# Patient Record
Sex: Female | Born: 1990 | Race: White | Hispanic: No | Marital: Single | Smoking: Current every day smoker
Health system: Southern US, Community
[De-identification: ages and names within clinical notes are randomized; demographics above are authoritative.]

## PROBLEM LIST (undated history)

## (undated) DIAGNOSIS — J45909 Unspecified asthma, uncomplicated: Secondary | ICD-10-CM

## (undated) DIAGNOSIS — F1920 Other psychoactive substance dependence, uncomplicated: Secondary | ICD-10-CM

## (undated) DIAGNOSIS — K649 Unspecified hemorrhoids: Secondary | ICD-10-CM

## (undated) DIAGNOSIS — F1020 Alcohol dependence, uncomplicated: Secondary | ICD-10-CM

## (undated) DIAGNOSIS — K7030 Alcoholic cirrhosis of liver without ascites: Secondary | ICD-10-CM

## (undated) DIAGNOSIS — R000 Tachycardia, unspecified: Secondary | ICD-10-CM

## (undated) DIAGNOSIS — G43019 Migraine without aura, intractable, without status migrainosus: Principal | ICD-10-CM

## (undated) DIAGNOSIS — K226 Gastro-esophageal laceration-hemorrhage syndrome: Secondary | ICD-10-CM

## (undated) DIAGNOSIS — B1920 Unspecified viral hepatitis C without hepatic coma: Secondary | ICD-10-CM

## (undated) DIAGNOSIS — E876 Hypokalemia: Secondary | ICD-10-CM

## (undated) DIAGNOSIS — A0472 Enterocolitis due to Clostridium difficile, not specified as recurrent: Secondary | ICD-10-CM

## (undated) HISTORY — DX: Unspecified viral hepatitis C without hepatic coma: B19.20

## (undated) HISTORY — DX: Gastro-esophageal laceration-hemorrhage syndrome: K22.6

## (undated) HISTORY — DX: Enterocolitis due to Clostridium difficile, not specified as recurrent: A04.72

## (undated) HISTORY — DX: Migraine without aura, intractable, without status migrainosus: G43.019

## (undated) HISTORY — DX: Hypokalemia: E87.6

## (undated) HISTORY — PX: ESOPHAGUS SURGERY: SHX626

## (undated) HISTORY — DX: Unspecified hemorrhoids: K64.9

## (undated) HISTORY — DX: Alcoholic cirrhosis of liver without ascites: K70.30

## (undated) HISTORY — DX: Other disorders of phosphorus metabolism: E83.39

## (undated) HISTORY — PX: OTHER SURGICAL HISTORY: SHX169

---

## 2008-12-06 ENCOUNTER — Emergency Department: Admit: 2008-12-06 | Payer: Self-pay | Source: Emergency Department | Admitting: Emergency Medical Services

## 2010-09-26 ENCOUNTER — Ambulatory Visit: Payer: Self-pay

## 2010-09-30 LAB — LAB USE ONLY - HISTORICAL SURGICAL PATHOLOGY

## 2012-02-12 ENCOUNTER — Encounter (HOSPITAL_COMMUNITY): Payer: Self-pay | Admitting: *Deleted

## 2012-02-12 ENCOUNTER — Emergency Department (HOSPITAL_COMMUNITY)
Admission: EM | Admit: 2012-02-12 | Discharge: 2012-02-12 | Disposition: A | Discharge status: Home / Self Care | Payer: Self-pay | Attending: Emergency Medicine | Admitting: Emergency Medicine

## 2012-02-12 DIAGNOSIS — G40909 Epilepsy, unspecified, not intractable, without status epilepticus: Secondary | ICD-10-CM | POA: Insufficient documentation

## 2012-02-12 DIAGNOSIS — Z880 Allergy status to penicillin: Secondary | ICD-10-CM | POA: Insufficient documentation

## 2012-02-12 DIAGNOSIS — Y929 Unspecified place or not applicable: Secondary | ICD-10-CM | POA: Insufficient documentation

## 2012-02-12 DIAGNOSIS — Z882 Allergy status to sulfonamides status: Secondary | ICD-10-CM | POA: Insufficient documentation

## 2012-02-12 DIAGNOSIS — Z23 Encounter for immunization: Secondary | ICD-10-CM | POA: Insufficient documentation

## 2012-02-12 DIAGNOSIS — Z79899 Other long term (current) drug therapy: Secondary | ICD-10-CM | POA: Insufficient documentation

## 2012-02-12 DIAGNOSIS — S0180XA Unspecified open wound of other part of head, initial encounter: Secondary | ICD-10-CM | POA: Insufficient documentation

## 2012-02-12 DIAGNOSIS — S01119A Laceration without foreign body of unspecified eyelid and periocular area, initial encounter: Secondary | ICD-10-CM

## 2012-02-12 DIAGNOSIS — W101XXA Fall (on)(from) sidewalk curb, initial encounter: Secondary | ICD-10-CM | POA: Insufficient documentation

## 2012-02-12 MED ORDER — ACETAMINOPHEN 325 MG PO TABS
ORAL_TABLET | ORAL | Status: AC
  Filled 2012-02-12: qty 2

## 2012-02-12 MED ORDER — ACETAMINOPHEN 80 MG/0.8ML PO SUSP: 15.0000 mg/kg | Freq: Once | ORAL | Status: DC

## 2012-02-12 MED ORDER — ACETAMINOPHEN 325 MG PO TABS
650.0000 mg | ORAL_TABLET | Freq: Once | ORAL | Status: AC
  Administered 2012-02-12: 650 mg via ORAL

## 2012-02-12 MED ORDER — TETANUS-DIPHTH-ACELL PERTUSSIS 5-2.5-18.5 LF-MCG/0.5 IM SUSP
0.5000 mL | Freq: Once | INTRAMUSCULAR | Status: AC
  Administered 2012-02-12: 0.5 mL via INTRAMUSCULAR
  Filled 2012-02-12: qty 0.5

## 2012-02-12 NOTE — ED Provider Notes (Signed)
History     CSN: [id number]  Arrival date & time 02/12/12  1842   First MD Initiated Contact with Patient 02/12/12 1848      Chief Complaint  Patient presents with  . Fall  . Seizures    (Consider location/radiation/quality/duration/timing/severity/associated sxs/prior treatment) Patient is a 21 y.o. female presenting with fall. The history is provided by the patient.  Fall Associated symptoms include headaches. Pertinent negatives include no numbness, no nausea and no vomiting.   the patient is a 21 year old, female, who presents emergency she had a falls.  She tripped on a curb.  Her grandmother stated that she had some shaking activity, which she thought was a seizure.  The patient has never had a seizure in the past.  Presently, the patient is on a backboard with a collar in place.  She complains of a headache.  She denies nausea, vomiting vision changes.  She denies neck pain, weakness, or paresthesias.  She denies chest pain, back pain, or abdominal pain.  She denies pain in any of her extremities.  She is not taking any anticoagulants.  Her last tetanus shot was several years ago.  History reviewed. No pertinent past medical history.  History reviewed. No pertinent past surgical history.  History reviewed. No pertinent family history.  History  Substance Use Topics  . Smoking status: Not on file  . Smokeless tobacco: Not on file  . Alcohol Use: Not on file    OB History    Grav Para Term Preterm Abortions TAB SAB Ect Mult Living                  Review of Systems  HENT: Negative for neck pain.   Eyes: Negative for visual disturbance.  Cardiovascular: Negative for chest pain.  Gastrointestinal: Negative for nausea and vomiting.  Musculoskeletal: Negative for back pain.  Skin: Positive for wound.       Laceration in left eyebrow  Neurological: Positive for seizures and headaches. Negative for weakness and numbness.  Hematological: Does not bruise/bleed easily.    Psychiatric/Behavioral: Negative for confusion.  All other systems reviewed and are negative.    Allergies  Penicillins and Sulfa antibiotics  Home Medications   Current Outpatient Rx  Name Route Sig Dispense Refill  . ALBUTEROL SULFATE HFA 108 (90 BASE) MCG/ACT IN AERS Inhalation Inhale 2 puffs into the lungs every 6 (six) hours as needed.      BP 122/85  Pulse 98  Temp 98.5 F (36.9 C)  Resp 18  SpO2 100%  Physical Exam  Nursing note and vitals reviewed. Constitutional: She is oriented to person, place, and time. She appears well-developed and well-nourished. No distress.       Immobilized on backboard with collar in place  HENT:  Head: Normocephalic.       2 cm clean laceration in left eyebrow, without active bleeding  Eyes: Conjunctivae and EOM are normal. Pupils are equal, round, and reactive to light.  Neck: Normal range of motion. Neck supple.       No spinal tenderness.  Collar removed.  Patient.  Flexed her neck without pain, or paresthesias or weakness  Cardiovascular: Normal rate.   No murmur heard. Pulmonary/Chest: Effort normal. No respiratory distress. She exhibits no tenderness.  Abdominal: Soft. She exhibits no distension. There is no tenderness.  Musculoskeletal: Normal range of motion.       No spinal tenderness.  No extremity deformities, swelling, or pain with range of motion  Neurological: She  is alert and oriented to person, place, and time. No cranial nerve deficit.  Skin: Skin is warm and dry.  Psychiatric: She has a normal mood and affect. Thought content normal.    ED Course  Procedures (including critical care time) Left eyebrow laceration after fall.  No other injuries.  No signs of intracranial injury or significant injuries.  Laceration repair with Dermabond and Steri-Strips   Labs Reviewed - No data to display No results found.   No diagnosis found.    MDM  Left eyebrow laceration after fall.  Tetanus updated.  No significant  injuries        Cheri Guppy, MD 02/12/12 2050

## 2012-02-12 NOTE — Discharge Instructions (Signed)
Keep your wound clean and dry.  You Steri-Strips for the next 5 days to support.  The Dermabond glue.  Followup with your Dr. for signs of infection.  Use Tylenol for pain

## 2012-02-12 NOTE — ED Notes (Signed)
Family at bedside.  No distress.  Pt sitting up talking without distress.

## 2012-02-12 NOTE — ED Notes (Signed)
Per EMS:  Pt from home.  Reports that pt tripped while walking on a crub.  Witnesses report that patient had a seizure after the fall.  EMS reports confusion, no combative, aggitation.  Pt reports that the year is 2012 and that is it June.  pts grandmother at bedside.  Pt is a recovering drug addict.  Vitals stable.  No distress noted.

## 2012-02-12 NOTE — ED Notes (Signed)
See triage note by same RN.  Assessment done with EMS at bedside.

## 2012-11-16 DIAGNOSIS — B1920 Unspecified viral hepatitis C without hepatic coma: Secondary | ICD-10-CM

## 2012-11-16 HISTORY — DX: Unspecified viral hepatitis C without hepatic coma: B19.20

## 2013-11-16 DIAGNOSIS — Z8619 Personal history of other infectious and parasitic diseases: Secondary | ICD-10-CM | POA: Insufficient documentation

## 2014-02-25 ENCOUNTER — Encounter (HOSPITAL_COMMUNITY): Payer: Self-pay | Admitting: Emergency Medicine

## 2014-02-25 DIAGNOSIS — J45909 Unspecified asthma, uncomplicated: Secondary | ICD-10-CM | POA: Diagnosis not present

## 2014-02-25 DIAGNOSIS — Z79899 Other long term (current) drug therapy: Secondary | ICD-10-CM | POA: Diagnosis not present

## 2014-02-25 DIAGNOSIS — E860 Dehydration: Secondary | ICD-10-CM | POA: Diagnosis not present

## 2014-02-25 DIAGNOSIS — R112 Nausea with vomiting, unspecified: Secondary | ICD-10-CM | POA: Diagnosis present

## 2014-02-25 DIAGNOSIS — F17200 Nicotine dependence, unspecified, uncomplicated: Secondary | ICD-10-CM | POA: Insufficient documentation

## 2014-02-25 DIAGNOSIS — Z880 Allergy status to penicillin: Secondary | ICD-10-CM | POA: Insufficient documentation

## 2014-02-25 DIAGNOSIS — Z3202 Encounter for pregnancy test, result negative: Secondary | ICD-10-CM | POA: Insufficient documentation

## 2014-02-25 LAB — URINE MICROSCOPIC-ADD ON

## 2014-02-25 LAB — URINALYSIS, ROUTINE W REFLEX MICROSCOPIC
Bilirubin Urine: NEGATIVE
GLUCOSE, UA: NEGATIVE mg/dL
Hgb urine dipstick: NEGATIVE
KETONES UR: NEGATIVE mg/dL
LEUKOCYTES UA: NEGATIVE
NITRITE: NEGATIVE
PH: 5 (ref 5.0–8.0)
PROTEIN: NEGATIVE mg/dL
Specific Gravity, Urine: 1.021 (ref 1.005–1.030)
Urobilinogen, UA: 0.2 mg/dL (ref 0.0–1.0)

## 2014-02-25 LAB — POC URINE PREG, ED: PREG TEST UR: NEGATIVE

## 2014-02-25 MED ORDER — ONDANSETRON 4 MG PO TBDP
8.0000 mg | ORAL_TABLET | Freq: Once | ORAL | Status: AC
Start: 2014-02-25 — End: 2014-02-25
  Administered 2014-02-25: 8 mg via ORAL
  Filled 2014-02-25: qty 2

## 2014-02-25 NOTE — ED Notes (Signed)
To ED for eval of nausea and vomiting that started last night. Pt states she last ate [employer] cheeseburger last pm. C/o abd cramping. No back pain. LMP last week and normal. Denies fevers

## 2014-02-26 ENCOUNTER — Emergency Department (HOSPITAL_COMMUNITY)
Admission: EM | Admit: 2014-02-26 | Discharge: 2014-02-26 | Disposition: A | Discharge status: Home / Self Care | Payer: Medicaid Other | Attending: Emergency Medicine | Admitting: Emergency Medicine

## 2014-02-26 DIAGNOSIS — R1110 Vomiting, unspecified: Secondary | ICD-10-CM

## 2014-02-26 DIAGNOSIS — E860 Dehydration: Secondary | ICD-10-CM

## 2014-02-26 HISTORY — DX: Unspecified asthma, uncomplicated: J45.909

## 2014-02-26 LAB — BASIC METABOLIC PANEL
Anion gap: 19 — ABNORMAL HIGH (ref 5–15)
BUN: 7 mg/dL (ref 6–23)
CALCIUM: 8.6 mg/dL (ref 8.4–10.5)
CHLORIDE: 96 meq/L (ref 96–112)
CO2: 26 meq/L (ref 19–32)
CREATININE: 0.68 mg/dL (ref 0.50–1.10)
GFR calc Af Amer: >90 mL/min (ref >90)
GFR calc non Af Amer: >90 mL/min (ref >90)
Glucose, Bld: 92 mg/dL (ref 70–99)
Potassium: 3.4 mEq/L — ABNORMAL LOW (ref 3.7–5.3)
Sodium: 141 mEq/L (ref 137–147)

## 2014-02-26 LAB — CBC
HEMATOCRIT: 34.3 % — AB (ref 36.0–46.0)
HEMOGLOBIN: 12 g/dL (ref 12.0–15.0)
MCH: 30.2 pg (ref 26.0–34.0)
MCHC: 35 g/dL (ref 30.0–36.0)
MCV: 86.2 fL (ref 78.0–100.0)
Platelets: 205 10*3/uL (ref 150–400)
RBC: 3.98 MIL/uL (ref 3.87–5.11)
RDW: 14.4 % (ref 11.5–15.5)
WBC: 8.7 10*3/uL (ref 4.0–10.5)

## 2014-02-26 MED ORDER — PROMETHAZINE HCL 25 MG PO TABS: 25.0000 mg | ORAL_TABLET | Freq: Four times a day (QID) | ORAL | Status: DC | PRN

## 2014-02-26 MED ORDER — FAMOTIDINE IN NACL 20-0.9 MG/50ML-% IV SOLN
20.0000 mg | Freq: Once | INTRAVENOUS | Status: AC
Start: 2014-02-26 — End: 2014-02-26
  Administered 2014-02-26: 20 mg via INTRAVENOUS
  Filled 2014-02-26: qty 50

## 2014-02-26 MED ORDER — SODIUM CHLORIDE 0.9 % IV BOLUS (SEPSIS)
1000.0000 mL | Freq: Once | INTRAVENOUS | Status: AC
  Administered 2014-02-26: 1000 mL via INTRAVENOUS

## 2014-02-26 NOTE — ED Notes (Signed)
Has been vomiting since waking yesterday morning.  Unable to keep even sips of water down. Recently dx with hepatitis, has not started treatment.  Reports approx 7 similar episodes in past few months.

## 2014-02-26 NOTE — ED Notes (Signed)
Patient reports she is feeling some better.  No n/v.  She is complaining only of being tired and ready to go home.  Iv bolus infusing.  Will d/c at completion

## 2014-02-26 NOTE — Discharge Instructions (Signed)
Dehydration, Adult Dehydration is when you lose more fluids from the body than you take in. Vital organs like the kidneys, brain, and heart cannot function without a proper amount of fluids and salt. Any loss of fluids from the body can cause dehydration.  CAUSES   Vomiting.  Diarrhea.  Excessive sweating.  Excessive urine output.  Fever. SYMPTOMS  Mild dehydration  Thirst.  Dry lips.  Slightly dry mouth. Moderate dehydration  Very dry mouth.  Sunken eyes.  Skin does not bounce back quickly when lightly pinched and released.  Dark urine and decreased urine production.  Decreased tear production.  Headache. Severe dehydration  Very dry mouth.  Extreme thirst.  Rapid, weak pulse (more than 100 beats per minute at rest).  Cold hands and feet.  Not able to sweat in spite of heat and temperature.  Rapid breathing.  Blue lips.  Confusion and lethargy.  Difficulty being awakened.  Minimal urine production.  No tears. DIAGNOSIS  Your caregiver will diagnose dehydration based on your symptoms and your exam. Blood and urine tests will help confirm the diagnosis. The diagnostic evaluation should also identify the cause of dehydration. TREATMENT  Treatment of mild or moderate dehydration can often be done at home by increasing the amount of fluids that you drink. It is best to drink small amounts of fluid more often. Drinking too much at one time can make vomiting worse. Refer to the home care instructions below. Severe dehydration needs to be treated at the hospital where you will probably be given intravenous (IV) fluids that contain water and electrolytes. HOME CARE INSTRUCTIONS   Ask your caregiver about specific rehydration instructions.  Drink enough fluids to keep your urine clear or pale yellow.  Drink small amounts frequently if you have nausea and vomiting.  Eat as you normally do.  Avoid:  Foods or drinks high in sugar.  Carbonated  drinks.  Juice.  Extremely hot or cold fluids.  Drinks with caffeine.  Fatty, greasy foods.  Alcohol.  Tobacco.  Overeating.  Gelatin desserts.  Wash your hands well to avoid spreading bacteria and viruses.  Only take over-the-counter or prescription medicines for pain, discomfort, or fever as directed by your caregiver.  Ask your caregiver if you should continue all prescribed and over-the-counter medicines.  Keep all follow-up appointments with your caregiver. SEEK MEDICAL CARE IF:  You have abdominal pain and it increases or stays in one area (localizes).  You have a rash, stiff neck, or severe headache.  You are irritable, sleepy, or difficult to awaken.  You are weak, dizzy, or extremely thirsty. SEEK IMMEDIATE MEDICAL CARE IF:   You are unable to keep fluids down or you get worse despite treatment.  You have frequent episodes of vomiting or diarrhea.  You have blood or green matter (bile) in your vomit.  You have blood in your stool or your stool looks black and tarry.  You have not urinated in 6 to 8 hours, or you have only urinated a small amount of very dark urine.  You have a fever.  You faint. MAKE SURE YOU:   Understand these instructions.  Will watch your condition.  Will get help right away if you are not doing well or get worse. Document Released: 08/04/2005 Document Revised: 10/27/2011 Document Reviewed: 03/24/2011 ExitCare Patient Information 2015 [employer]. This information is not intended to replace advice given to you by your health care provider. Make sure you discuss any questions you have with your health care   provider.  

## 2014-02-26 NOTE — ED Notes (Signed)
Attempted IV x 1, unsuccessful.  Will call IV team to start line. Pt aware.

## 2014-02-26 NOTE — ED Notes (Signed)
Attempted IV x3, unsuccessful.

## 2014-02-26 NOTE — ED Provider Notes (Signed)
CSN: [id number]     Arrival date & time 02/25/14  2047 History   First MD Initiated Contact with Patient 02/26/14 0034     Chief Complaint  Patient presents with  . Emesis     (Consider location/radiation/quality/duration/timing/severity/associated sxs/prior Treatment) HPI This patient is a 23 year old woman who presents after experiencing several hours of intractable nausea and vomiting. Her emesis was nonbilious and nonbloody. At the time of intractable vomiting, the patient had diffuse abdominal pain. However, she is a pain free at this time. She says she feels dehydrated.  She has not experienced fever, GU symptoms or diarrhea. She says she took Zofran at home but was unable to keep down. She notes a history of similar symptoms in the pas.  Past Medical History  Diagnosis Date  . Asthma    History reviewed. No pertinent past surgical history. History reviewed. No pertinent family history. History  Substance Use Topics  . Smoking status: Current Every Day Smoker -- 0.50 packs/day    Types: Cigarettes  . Smokeless tobacco: Not on file  . Alcohol Use: No   OB History   Grav Para Term Preterm Abortions TAB SAB Ect Mult Living                 Review of Systems  Ten point review of symptoms performed and is negative with the exception of symptoms noted above.   Allergies  Penicillins and Sulfa antibiotics  Home Medications   Prior to Admission medications   Medication Sig Start Date End Date Taking? Authorizing Provider  albuterol (PROVENTIL HFA;VENTOLIN HFA) 108 (90 BASE) MCG/ACT inhaler Inhale 2 puffs into the lungs every 6 (six) hours as needed. For shortness of breath   Yes Historical Provider, MD  Cyanocobalamin (B-12 PO) Take 1 tablet by mouth daily.   Yes Historical Provider, MD  folic acid (FOLVITE) 1 MG tablet Take 1 mg by mouth 2 (two) times daily.   Yes Historical Provider, MD  furosemide (LASIX) 20 MG tablet Take 20 mg by mouth daily as needed for fluid.   Yes  Historical Provider, MD  gabapentin (NEURONTIN) 300 MG capsule Take 300 mg by mouth 2 (two) times daily.   Yes Historical Provider, MD  Isopropyl Alcohol (SWIMMERS EAR DROPS OT) Place 3-4 drops into both ears as needed (for swimmers ear).   Yes Historical Provider, MD  Multiple Vitamins-Minerals (MULTIVITAMIN PO) Take 1 tablet by mouth daily.   Yes Historical Provider, MD  norethindrone-ethinyl estradiol (MICROGESTIN,JUNEL,LOESTRIN) 1-20 MG-MCG tablet Take 1 tablet by mouth daily.   Yes Historical Provider, MD  Pyridoxine HCl (B-6 PO) Take 1 tablet by mouth daily.   Yes Historical Provider, MD  Thiamine HCl (B-1 PO) Take 1 tablet by mouth daily.   Yes Historical Provider, MD   BP 135/91  Pulse 90  Temp(Src) 98.8 F (37.1 C) (Oral)  Resp 18  Ht 5\' 5"  (1.651 m)  Wt 110 lb (49.896 kg)  BMI 18.31 kg/m2  SpO2 100%  LMP 02/18/2014 Physical Exam  Gen: well developed and well nourished appearing Head: NCAT Eyes: PERL, EOMI Nose: no epistaixis or rhinorrhea Mouth/throat: mucosa is mildly dehydrated appearing and pink Neck: supple, no stridor Lungs: CTA B, no wheezing, rhonchi or rales CV: RRR, no murmur, extremities appear well perfused.  Abd: soft, notender, nondistended Back: no ttp, no cva ttp Skin: warm and dry Ext: normal to inspection, no dependent edema Neuro: CN ii-xii grossly intact, no focal deficits Psyche; normal affect,  calm and cooperative.  ED Course  Procedures (including critical care time) Labs Review  Results for orders placed during the hospital encounter of 02/26/14 (from the past 24 hour(s))  POC URINE PREG, ED     Status: None   Collection Time    02/25/14  9:19 PM      Result Value Ref Range   Preg Test, Ur NEGATIVE  NEGATIVE  URINALYSIS, ROUTINE W REFLEX MICROSCOPIC     Status: Abnormal   Collection Time    02/25/14  9:19 PM      Result Value Ref Range   Color, Urine YELLOW  YELLOW   APPearance TURBID (*) CLEAR   Specific Gravity, Urine 1.021   1.005 - 1.030   pH 5.0  5.0 - 8.0   Glucose, UA NEGATIVE  NEGATIVE mg/dL   Hgb urine dipstick NEGATIVE  NEGATIVE   Bilirubin Urine NEGATIVE  NEGATIVE   Ketones, ur NEGATIVE  NEGATIVE mg/dL   Protein, ur NEGATIVE  NEGATIVE mg/dL   Urobilinogen, UA 0.2  0.0 - 1.0 mg/dL   Nitrite NEGATIVE  NEGATIVE   Leukocytes, UA NEGATIVE  NEGATIVE  URINE MICROSCOPIC-ADD ON     Status: Abnormal   Collection Time    02/25/14  9:19 PM      Result Value Ref Range   Squamous Epithelial / LPF FEW (*) RARE   Bacteria, UA FEW (*) RARE   Crystals URIC ACID CRYSTALS (*) NEGATIVE   Urine-Other AMORPHOUS URATES/PHOSPHATES    CBC     Status: Abnormal   Collection Time    02/26/14 12:55 AM      Result Value Ref Range   WBC 8.7  4.0 - 10.5 K/uL   RBC 3.98  3.87 - 5.11 MIL/uL   Hemoglobin 12.0  12.0 - 15.0 g/dL   HCT 16.1 (*) 09.6 - 04.5 %   MCV 86.2  78.0 - 100.0 fL   MCH 30.2  26.0 - 34.0 pg   MCHC 35.0  30.0 - 36.0 g/dL   RDW 40.9  81.1 - 91.4 %   Platelets 205  150 - 400 K/uL     MDM    DDX: gastritis, PUD, GERD, pancreatitis, gallbladder disease, SBO, colitis, UTI, enteritis.   Patient's VS and abdominal exam are benign. She is able to tolerate sips of clears. We will volume resuscitate with NS. Results of BMP pending. Anticipate d/c home.    Brandt Loosen, MD 02/26/14 [phone]

## 2014-06-21 ENCOUNTER — Encounter: Payer: Self-pay | Admitting: Family Medicine

## 2014-06-21 ENCOUNTER — Ambulatory Visit: Payer: Medicaid Other | Attending: Family Medicine | Admitting: Family Medicine

## 2014-06-21 ENCOUNTER — Ambulatory Visit (HOSPITAL_BASED_OUTPATIENT_CLINIC_OR_DEPARTMENT_OTHER): Payer: Medicaid Other | Admitting: *Deleted

## 2014-06-21 ENCOUNTER — Other Ambulatory Visit: Payer: Self-pay | Admitting: Family Medicine

## 2014-06-21 VITALS — BP 105/73 | HR 100 | Temp 98.7°F | Resp 18 | Ht 65.0 in | Wt 104.0 lb

## 2014-06-21 DIAGNOSIS — F418 Other specified anxiety disorders: Secondary | ICD-10-CM | POA: Diagnosis not present

## 2014-06-21 DIAGNOSIS — Z7689 Persons encountering health services in other specified circumstances: Secondary | ICD-10-CM | POA: Diagnosis present

## 2014-06-21 DIAGNOSIS — M25562 Pain in left knee: Secondary | ICD-10-CM | POA: Diagnosis not present

## 2014-06-21 DIAGNOSIS — B1920 Unspecified viral hepatitis C without hepatic coma: Secondary | ICD-10-CM | POA: Insufficient documentation

## 2014-06-21 DIAGNOSIS — Z23 Encounter for immunization: Secondary | ICD-10-CM | POA: Diagnosis not present

## 2014-06-21 DIAGNOSIS — M25561 Pain in right knee: Secondary | ICD-10-CM | POA: Diagnosis not present

## 2014-06-21 DIAGNOSIS — F17200 Nicotine dependence, unspecified, uncomplicated: Secondary | ICD-10-CM | POA: Diagnosis not present

## 2014-06-21 DIAGNOSIS — R630 Anorexia: Secondary | ICD-10-CM | POA: Diagnosis not present

## 2014-06-21 DIAGNOSIS — G479 Sleep disorder, unspecified: Secondary | ICD-10-CM | POA: Diagnosis not present

## 2014-06-21 DIAGNOSIS — J45909 Unspecified asthma, uncomplicated: Secondary | ICD-10-CM | POA: Insufficient documentation

## 2014-06-21 DIAGNOSIS — R110 Nausea: Secondary | ICD-10-CM | POA: Diagnosis not present

## 2014-06-21 DIAGNOSIS — M25519 Pain in unspecified shoulder: Secondary | ICD-10-CM | POA: Insufficient documentation

## 2014-06-21 DIAGNOSIS — D649 Anemia, unspecified: Secondary | ICD-10-CM

## 2014-06-21 DIAGNOSIS — B182 Chronic viral hepatitis C: Secondary | ICD-10-CM | POA: Insufficient documentation

## 2014-06-21 DIAGNOSIS — F1099 Alcohol use, unspecified with unspecified alcohol-induced disorder: Secondary | ICD-10-CM | POA: Diagnosis not present

## 2014-06-21 DIAGNOSIS — F329 Major depressive disorder, single episode, unspecified: Secondary | ICD-10-CM

## 2014-06-21 DIAGNOSIS — E876 Hypokalemia: Secondary | ICD-10-CM

## 2014-06-21 DIAGNOSIS — F32A Depression, unspecified: Secondary | ICD-10-CM

## 2014-06-21 DIAGNOSIS — R634 Abnormal weight loss: Secondary | ICD-10-CM | POA: Insufficient documentation

## 2014-06-21 LAB — POCT URINALYSIS DIPSTICK
Bilirubin, UA: NEGATIVE
Blood, UA: NEGATIVE
Glucose, UA: NEGATIVE
KETONES UA: NEGATIVE
Nitrite, UA: NEGATIVE
PH UA: 6.5
PROTEIN UA: NEGATIVE
Spec Grav, UA: <=1.005
UROBILINOGEN UA: 0.2

## 2014-06-21 LAB — CBC
HCT: 32.9 % — ABNORMAL LOW (ref 36.0–46.0)
Hemoglobin: 10.9 g/dL — ABNORMAL LOW (ref 12.0–15.0)
MCH: 31.8 pg (ref 26.0–34.0)
MCHC: 33.1 g/dL (ref 30.0–36.0)
MCV: 95.9 fL (ref 78.0–100.0)
PLATELETS: 299 10*3/uL (ref 150–400)
RBC: 3.43 MIL/uL — ABNORMAL LOW (ref 3.87–5.11)
RDW: 16 % — AB (ref 11.5–15.5)
WBC: 6 10*3/uL (ref 4.0–10.5)

## 2014-06-21 MED ORDER — PROMETHAZINE HCL 25 MG PO TABS: 25.0000 mg | ORAL_TABLET | Freq: Four times a day (QID) | ORAL | Status: DC | PRN

## 2014-06-21 MED ORDER — ALBUTEROL SULFATE HFA 108 (90 BASE) MCG/ACT IN AERS: 2.0000 | INHALATION_SPRAY | Freq: Four times a day (QID) | RESPIRATORY_TRACT | Status: DC | PRN

## 2014-06-21 MED ORDER — FUROSEMIDE 20 MG PO TABS: 20.0000 mg | ORAL_TABLET | Freq: Every day | ORAL | Status: DC | PRN

## 2014-06-21 MED ORDER — TRAMADOL HCL 50 MG PO TABS: 50.0000 mg | ORAL_TABLET | Freq: Two times a day (BID) | ORAL | Status: DC | PRN

## 2014-06-21 MED ORDER — MIRTAZAPINE 15 MG PO TABS: 15.0000 mg | ORAL_TABLET | Freq: Every day | ORAL | Status: DC

## 2014-06-21 NOTE — Progress Notes (Signed)
   Subjective:    Patient ID: Mary Garrison, female    DOB: 1991/06/26, 23 y.o.   MRN: [id number] CC: establish care, Hep C HPI 23 yo F presents to establish care  1. Hep C: dx 11/2012 while living in [place]. Presented with aches, weight loss, edema. Has not had antiviral treatment. Reports being HIV negative. Drinks 2-3 alcoholic drinks nightly. No tylenol or NSAIDs. Not sexually active. Reports 20-25# weight loss over the past year. Admits to nausea every morning. occasional non-bloody emesis, no diarrhea, no abdominal pain, no bleeding, no bruising. Is achy is shoulders and knees daily.   2. Anxiety and depression: reports hx of both. Previously treated with benzo and effexor. Patient did not like the way they made her feels. Admits to depressed mood and poor sleep. Denies SI and HI.   Med Hx: hep C dx 11/2012 Fam Hx: depression in brother  Soc Hx: smoker 1/4 PPD,  ETOH 2-3 drinks nightly  Review of Systems As per HPI  GAD-7:score of 7, 0-5, 1-1-4,7. 2-6.     Objective:   Physical Exam BP 105/73 mmHg  Pulse 100  Temp(Src) 98.7 F (37.1 C) (Oral)  Resp 18  Ht 5\' 5"  (1.651 m)  Wt 104 lb (47.174 kg)  BMI 17.31 kg/m2  SpO2 98%  LMP 02/24/2014 General appearance: alert, cooperative and no distress Eyes: negative findings: lids and lashes normal and pupils equal, round, reactive to light and accomodation, positive findings: conjunctiva: clear and sclera with icterus peripherally  Lungs: clear to auscultation bilaterally Heart: regular rate and rhythm, S1, S2 normal, no murmur, click, rub or gallop Abdomen: soft, non-tender; bowel sounds normal; no masses,  no organomegaly Extremities: extremities normal, atraumatic, no cyanosis or edema Skin: Skin color, texture, turgor normal. No rashes or lesions       Assessment & Plan:

## 2014-06-21 NOTE — Patient Instructions (Signed)
[name],  Thank you for coming in today. It was a pleasure meeting you. I look forward to being your primary doctor.  1. For appetite: please start remeron 15 mg nightly.  2. For nausea: phenergan as needed  3. For pain in knees and shoulder: tramadol refilled.   Please stop drinking alcohol completely by slowly cutting back.   I have placed a referral to the Hep C clinic.  You will be called with lab results.   F/u with me in 4 weeks for weight check.   Dr. Armen PickupFunches

## 2014-06-21 NOTE — Progress Notes (Signed)
Establish Care Pt stated has Hep C Moved for [place] in July

## 2014-06-21 NOTE — Assessment & Plan Note (Addendum)
  1. For appetite: please start remeron 15 mg nightly.  2. For nausea: phenergan as needed  3. For pain in knees and shoulder: tramadol refilled.   Please stop drinking alcohol completely by slowly cutting back.   Patient to sign release of medical information form to obtain and review records   I have placed a referral to the Hep C clinic.  You will be called with lab results: CBC, CP, HIV, Hep C Ab with reflex, Hep C RNA quant, INR   F/u with me in 4 weeks for weight check.

## 2014-06-21 NOTE — Assessment & Plan Note (Signed)
A: depression with poor sleep and anorexia P: remeron

## 2014-06-22 LAB — COMPLETE METABOLIC PANEL WITH GFR
ALBUMIN: 3.2 g/dL — AB (ref 3.5–5.2)
ALT: 31 U/L (ref 0–35)
AST: 145 U/L — ABNORMAL HIGH (ref 0–37)
Alkaline Phosphatase: 146 U/L — ABNORMAL HIGH (ref 39–117)
BUN: 3 mg/dL — AB (ref 6–23)
CALCIUM: 9 mg/dL (ref 8.4–10.5)
CHLORIDE: 95 meq/L — AB (ref 96–112)
CO2: 27 meq/L (ref 19–32)
CREATININE: 0.47 mg/dL — AB (ref 0.50–1.10)
GLUCOSE: 91 mg/dL (ref 70–99)
POTASSIUM: 2.9 meq/L — AB (ref 3.5–5.3)
Sodium: 140 mEq/L (ref 135–145)
Total Bilirubin: 1.4 mg/dL — ABNORMAL HIGH (ref 0.2–1.2)
Total Protein: 6.2 g/dL (ref 6.0–8.3)

## 2014-06-22 LAB — PROTIME-INR
INR: 1.11 (ref <1.50)
PROTHROMBIN TIME: 14.3 s (ref 11.6–15.2)

## 2014-06-22 LAB — HEPATITIS C ANTIBODY: HCV Ab: REACTIVE — AB

## 2014-06-22 LAB — HIV ANTIBODY (ROUTINE TESTING W REFLEX): HIV 1&2 Ab, 4th Generation: NONREACTIVE

## 2014-06-26 ENCOUNTER — Telehealth: Payer: Self-pay | Admitting: *Deleted

## 2014-06-26 DIAGNOSIS — E876 Hypokalemia: Secondary | ICD-10-CM | POA: Insufficient documentation

## 2014-06-26 DIAGNOSIS — D649 Anemia, unspecified: Secondary | ICD-10-CM | POA: Insufficient documentation

## 2014-06-26 HISTORY — DX: Hypokalemia: E87.6

## 2014-06-26 LAB — HEPATITIS C VRS RNA DETECT BY PCR-QUAL: Hepatitis C Vrs RNA by PCR-Qual: POSITIVE — AB

## 2014-06-26 LAB — HEPATITIS C RNA QUANTITATIVE
HCV Quantitative Log: <1.18 {Log}
HCV Quantitative: <15 [IU]/mL

## 2014-06-26 MED ORDER — POTASSIUM CHLORIDE CRYS ER 20 MEQ PO TBCR: 40.0000 meq | EXTENDED_RELEASE_TABLET | Freq: Two times a day (BID) | ORAL | Status: DC

## 2014-06-26 NOTE — Addendum Note (Signed)
Addended by: Dessa PhiFUNCHES, Dayna Geurts on: 06/26/2014 10:48 AM   Modules accepted: Orders

## 2014-06-26 NOTE — Telephone Encounter (Signed)
Pt aware of lab results, transfer call to front office for lab appointment  Advised to stop drinking alcohol.

## 2014-06-26 NOTE — Telephone Encounter (Signed)
-----   Message from Josalyn C Funches, MD sent at 06/26/2014 10:48 AM EST ----- Mary Garrison,  Please call patient today.  HIV and Hep C quantitative negative. Patient does have liver dysfunction, low potassium and anemia, Plan: Take potassium supplement sent in [facility] two tabs twice daily for 5 days.  Quit alcohol completely by slowly tapering down. Return within the next week for f/u labs repeat BMP, Mag,  Phos, Hep C and Hep B.  Schedule lab visit. 

## 2014-06-26 NOTE — Assessment & Plan Note (Signed)
A: hypokalemia noted on CMP P: Supplemental K+ sent to patient's pharmacy 40 MEq BID x 5 days  F/u labs: Mag, phos, BMP

## 2014-06-26 NOTE — Telephone Encounter (Signed)
-----   Message from Lora PaulaJosalyn C Funches, MD sent at 06/26/2014 10:48 AM EST ----- Mary Garrison,  Please call patient today.  HIV and Hep C quantitative negative. Patient does have liver dysfunction, low potassium and anemia, Plan: [facility] twice daily for 5 days.  Quit alcohol completely by slowly tapering down. Return within the next week for f/u labs repeat BMP, Mag,  Phos, Hep C and Hep B.  Schedule lab visit.

## 2014-06-27 ENCOUNTER — Ambulatory Visit: Payer: Medicaid Other | Attending: Family Medicine

## 2014-06-27 DIAGNOSIS — D649 Anemia, unspecified: Secondary | ICD-10-CM

## 2014-06-27 DIAGNOSIS — B1920 Unspecified viral hepatitis C without hepatic coma: Secondary | ICD-10-CM

## 2014-06-27 DIAGNOSIS — E876 Hypokalemia: Secondary | ICD-10-CM

## 2014-06-27 LAB — FERRITIN: Ferritin: 273 ng/mL (ref 10–291)

## 2014-06-28 LAB — IRON AND TIBC
%SAT: 67 % — ABNORMAL HIGH (ref 20–55)
Iron: 151 ug/dL — ABNORMAL HIGH (ref 42–145)
TIBC: 226 ug/dL — ABNORMAL LOW (ref 250–470)
UIBC: 75 ug/dL — ABNORMAL LOW (ref 125–400)

## 2014-06-28 LAB — BASIC METABOLIC PANEL
BUN: 4 mg/dL — ABNORMAL LOW (ref 6–23)
CALCIUM: 8.9 mg/dL (ref 8.4–10.5)
CO2: 27 mEq/L (ref 19–32)
Chloride: 99 mEq/L (ref 96–112)
Creat: 0.5 mg/dL (ref 0.50–1.10)
Glucose, Bld: 117 mg/dL — ABNORMAL HIGH (ref 70–99)
Potassium: 3.3 mEq/L — ABNORMAL LOW (ref 3.5–5.3)
Sodium: 141 mEq/L (ref 135–145)

## 2014-06-28 LAB — PHOSPHORUS: Phosphorus: 1.4 mg/dL — ABNORMAL LOW (ref 2.3–4.6)

## 2014-06-28 LAB — HEPATITIS C VRS RNA DETECT BY PCR-QUAL: HEPATITIS C VRS RNA BY PCR-QUAL: POSITIVE — AB

## 2014-06-28 LAB — HEPATITIS B SURFACE ANTIBODY, QUANTITATIVE: Hepatitis B-Post: 202 m[IU]/mL

## 2014-06-28 LAB — HEPATITIS C ANTIBODY, REFLEX: HCV Ab: REACTIVE — AB

## 2014-06-28 LAB — HEPATITIS B SURFACE ANTIGEN: Hepatitis B Surface Ag: NEGATIVE

## 2014-06-28 LAB — MAGNESIUM: Magnesium: 1.7 mg/dL (ref 1.5–2.5)

## 2014-06-28 MED ORDER — FOLIC ACID 1 MG PO TABS: 1.0000 mg | ORAL_TABLET | Freq: Every day | ORAL | Status: DC

## 2014-06-28 NOTE — Addendum Note (Signed)
Addended by: Dessa PhiFUNCHES, Phuong Hillary on: 06/28/2014 09:12 AM   Modules accepted: Orders

## 2014-06-29 ENCOUNTER — Telehealth: Payer: Self-pay | Admitting: Family Medicine

## 2014-06-29 HISTORY — DX: Other disorders of phosphorus metabolism: E83.39

## 2014-06-29 MED ORDER — POTASSIUM & SODIUM PHOSPHATES 280-160-250 MG PO PACK: 1.0000 | PACK | Freq: Three times a day (TID) | ORAL | Status: DC

## 2014-06-29 NOTE — Assessment & Plan Note (Signed)
A: low phos, normal calcium. Suspect alcohol abuse. Will get vit D level to r/o deficiency. Will replete with oral phosphorous.  P: Phos nak sent to pharmacy Recheck phos levels in 3-4 weeks

## 2014-06-29 NOTE — Telephone Encounter (Signed)
Called patient. Left VM Hep C confirmed Hep B immune  Normal Mag, improved potassium Low phos will need to take supplement since to pharmacy. Patient to start as soon as possible.

## 2014-07-03 ENCOUNTER — Other Ambulatory Visit: Payer: Medicaid Other

## 2014-07-11 ENCOUNTER — Encounter (HOSPITAL_COMMUNITY): Payer: Self-pay

## 2014-07-11 ENCOUNTER — Emergency Department (HOSPITAL_COMMUNITY)
Admission: EM | Admit: 2014-07-11 | Discharge: 2014-07-11 | Disposition: A | Discharge status: Home / Self Care | Payer: Medicaid Other | Attending: Emergency Medicine | Admitting: Emergency Medicine

## 2014-07-11 DIAGNOSIS — R509 Fever, unspecified: Secondary | ICD-10-CM | POA: Insufficient documentation

## 2014-07-11 DIAGNOSIS — R197 Diarrhea, unspecified: Secondary | ICD-10-CM | POA: Insufficient documentation

## 2014-07-11 DIAGNOSIS — J45909 Unspecified asthma, uncomplicated: Secondary | ICD-10-CM | POA: Insufficient documentation

## 2014-07-11 DIAGNOSIS — E46 Unspecified protein-calorie malnutrition: Secondary | ICD-10-CM | POA: Diagnosis not present

## 2014-07-11 DIAGNOSIS — Z3202 Encounter for pregnancy test, result negative: Secondary | ICD-10-CM | POA: Insufficient documentation

## 2014-07-11 DIAGNOSIS — R5383 Other fatigue: Secondary | ICD-10-CM | POA: Diagnosis not present

## 2014-07-11 DIAGNOSIS — Z79899 Other long term (current) drug therapy: Secondary | ICD-10-CM | POA: Diagnosis not present

## 2014-07-11 DIAGNOSIS — Z8619 Personal history of other infectious and parasitic diseases: Secondary | ICD-10-CM | POA: Insufficient documentation

## 2014-07-11 DIAGNOSIS — Z720 Tobacco use: Secondary | ICD-10-CM | POA: Diagnosis not present

## 2014-07-11 DIAGNOSIS — Z880 Allergy status to penicillin: Secondary | ICD-10-CM | POA: Insufficient documentation

## 2014-07-11 DIAGNOSIS — R112 Nausea with vomiting, unspecified: Secondary | ICD-10-CM

## 2014-07-11 LAB — URINALYSIS, ROUTINE W REFLEX MICROSCOPIC
BILIRUBIN URINE: NEGATIVE
Glucose, UA: NEGATIVE mg/dL
HGB URINE DIPSTICK: NEGATIVE
Ketones, ur: NEGATIVE mg/dL
Nitrite: NEGATIVE
Specific Gravity, Urine: 1.023 (ref 1.005–1.030)
UROBILINOGEN UA: 1 mg/dL (ref 0.0–1.0)
pH: 8.5 — ABNORMAL HIGH (ref 5.0–8.0)

## 2014-07-11 LAB — URINE MICROSCOPIC-ADD ON

## 2014-07-11 LAB — CBC WITH DIFFERENTIAL/PLATELET
BASOS PCT: 1 % (ref 0–1)
Basophils Absolute: 0 10*3/uL (ref 0.0–0.1)
Eosinophils Absolute: 0 10*3/uL (ref 0.0–0.7)
Eosinophils Relative: 0 % (ref 0–5)
HCT: 35.2 % — ABNORMAL LOW (ref 36.0–46.0)
Hemoglobin: 11.5 g/dL — ABNORMAL LOW (ref 12.0–15.0)
LYMPHS ABS: 1.6 10*3/uL (ref 0.7–4.0)
Lymphocytes Relative: 25 % (ref 12–46)
MCH: 31.4 pg (ref 26.0–34.0)
MCHC: 32.7 g/dL (ref 30.0–36.0)
MCV: 96.2 fL (ref 78.0–100.0)
Monocytes Absolute: 0.5 10*3/uL (ref 0.1–1.0)
Monocytes Relative: 9 % (ref 3–12)
NEUTROS ABS: 4.2 10*3/uL (ref 1.7–7.7)
NEUTROS PCT: 65 % (ref 43–77)
Platelets: 255 10*3/uL (ref 150–400)
RBC: 3.66 MIL/uL — AB (ref 3.87–5.11)
RDW: 14.5 % (ref 11.5–15.5)
WBC: 6.4 10*3/uL (ref 4.0–10.5)

## 2014-07-11 LAB — COMPREHENSIVE METABOLIC PANEL
ALK PHOS: 166 U/L — AB (ref 39–117)
ALT: 78 U/L — ABNORMAL HIGH (ref 0–35)
ANION GAP: 23 — AB (ref 5–15)
AST: 328 U/L — ABNORMAL HIGH (ref 0–37)
Albumin: 3.5 g/dL (ref 3.5–5.2)
BILIRUBIN TOTAL: 1.6 mg/dL — AB (ref 0.3–1.2)
BUN: 4 mg/dL — AB (ref 6–23)
CO2: 27 mEq/L (ref 19–32)
Calcium: 8.4 mg/dL (ref 8.4–10.5)
Chloride: 91 mEq/L — ABNORMAL LOW (ref 96–112)
Creatinine, Ser: 0.56 mg/dL (ref 0.50–1.10)
GFR calc Af Amer: >90 mL/min (ref >90)
GFR calc non Af Amer: >90 mL/min (ref >90)
GLUCOSE: 104 mg/dL — AB (ref 70–99)
POTASSIUM: 3.8 meq/L (ref 3.7–5.3)
Sodium: 141 mEq/L (ref 137–147)
TOTAL PROTEIN: 7.7 g/dL (ref 6.0–8.3)

## 2014-07-11 LAB — MAGNESIUM: Magnesium: 1.1 mg/dL — ABNORMAL LOW (ref 1.5–2.5)

## 2014-07-11 LAB — PREGNANCY, URINE: Preg Test, Ur: NEGATIVE

## 2014-07-11 LAB — LIPASE, BLOOD: Lipase: 11 U/L (ref 11–59)

## 2014-07-11 MED ORDER — MAGNESIUM GLUCONATE 500 MG PO TABS
500.0000 mg | ORAL_TABLET | Freq: Once | ORAL | Status: AC
  Administered 2014-07-11: 500 mg via ORAL
  Filled 2014-07-11: qty 1

## 2014-07-11 MED ORDER — SODIUM CHLORIDE 0.9 % IV BOLUS (SEPSIS)
1000.0000 mL | Freq: Once | INTRAVENOUS | Status: AC
  Administered 2014-07-11: 1000 mL via INTRAVENOUS

## 2014-07-11 MED ORDER — ONDANSETRON HCL 4 MG/2ML IJ SOLN
4.0000 mg | Freq: Once | INTRAMUSCULAR | Status: AC
  Administered 2014-07-11: 4 mg via INTRAVENOUS
  Filled 2014-07-11: qty 2

## 2014-07-11 MED ORDER — POTASSIUM CHLORIDE CRYS ER 20 MEQ PO TBCR
40.0000 meq | EXTENDED_RELEASE_TABLET | Freq: Two times a day (BID) | ORAL | Status: DC
  Administered 2014-07-11: 40 meq via ORAL
  Filled 2014-07-11: qty 2

## 2014-07-11 MED ORDER — LIDOCAINE HCL (PF) 1 % IJ SOLN
INTRAMUSCULAR | Status: AC
  Administered 2014-07-11: 0.5 mL via INTRADERMAL
  Filled 2014-07-11: qty 5

## 2014-07-11 MED ORDER — ONDANSETRON 4 MG PO TBDP
8.0000 mg | ORAL_TABLET | Freq: Once | ORAL | Status: AC
Start: 2014-07-11 — End: 2014-07-11
  Administered 2014-07-11: 8 mg via ORAL
  Filled 2014-07-11: qty 2

## 2014-07-11 MED ORDER — LORAZEPAM 1 MG PO TABS: 0.5000 mg | ORAL_TABLET | Freq: Four times a day (QID) | ORAL | Status: DC | PRN

## 2014-07-11 MED ORDER — LIDOCAINE HCL (PF) 1 % IJ SOLN
0.5000 mL | Freq: Once | INTRAMUSCULAR | Status: AC
  Administered 2014-07-11: 0.5 mL via INTRADERMAL

## 2014-07-11 MED ORDER — LORAZEPAM 2 MG/ML IJ SOLN
0.5000 mg | Freq: Once | INTRAMUSCULAR | Status: AC
  Administered 2014-07-11: 0.5 mg via INTRAVENOUS
  Filled 2014-07-11: qty 1

## 2014-07-11 MED ORDER — FENTANYL CITRATE 0.05 MG/ML IJ SOLN
25.0000 ug | Freq: Once | INTRAMUSCULAR | Status: AC
  Administered 2014-07-11: 25 ug via INTRAVENOUS
  Filled 2014-07-11: qty 2

## 2014-07-11 MED ORDER — PROMETHAZINE HCL 25 MG RE SUPP: 25.0000 mg | Freq: Four times a day (QID) | RECTAL | Status: DC | PRN

## 2014-07-11 NOTE — ED Notes (Signed)
Pt. Woke up with n/v this morning.  Also feels dehydrated and she reports her fingers are locked up. Pt. Is moving her hands.    Pt. Is very anxious.  She is alert and oriented.  No acute distress noted. Skin is pale , warm and dry.

## 2014-07-11 NOTE — ED Provider Notes (Signed)
Patient with multiple episodes of vomiting clear material onset of possibly 4 AM today. She's had similar episodes in the past etiology unclear. She denies abdominal pain denies headache. On exam alert nontoxic Glasgow Coma Score 15. Nursing unable to obtain peripheral iv access Procedure Angiocath insertion Performed by: Doug SouJACUBOWITZ,Shellyann Wandrey  Consent: Verbal consent obtained. Risks and benefits: risks, benefits and alternatives were discussed Time out: Immediately prior to procedure a "time out" was called to verify the correct patient, procedure, equipment, support staff and site/side marked as required.  Preparation: Patient was prepped and draped in the usual sterile fashion.  Vein Location: right external jugular    Gauge: 20 Unsuccessful attempt. No hematoma at site after catheter pulled Patient tolerance: Patient tolerated the procedure well with no immediate complications.    Doug SouSam Keyondra Lagrand, MD 07/11/14 2151

## 2014-07-11 NOTE — ED Notes (Signed)
Unable to obtain IV Access, Dr. Shela CommonsJ attempted EJ insertion without success. Eric RN at bedside to attempt US guided IV.

## 2014-07-11 NOTE — ED Notes (Signed)
Contacted lab regarding delay in CBC - states specimen clotted prior to obtaining results. Phlebotomy at bedside.

## 2014-07-11 NOTE — Discharge Instructions (Signed)
Cyclic Vomiting Syndrome °Cyclic vomiting syndrome is a benign condition in which patients experience bouts or cycles of severe nausea and vomiting that last for hours or even days. The bouts of nausea and vomiting alternate with longer periods of no symptoms and generally good health. Cyclic vomiting syndrome occurs mostly in children, but can affect adults. °CAUSES  °CVS has no known cause. Each episode is typically similar to the previous ones. The episodes tend to:  °· Start at about the same time of day. °· Last the same length of time. °· Present the same symptoms at the same level of intensity. °Cyclic vomiting syndrome can begin at any age in children and adults. Cyclic vomiting syndrome usually starts between the ages of 3 and 7 years. In adults, episodes tend to occur less often than they do in children, but they last longer. Furthermore, the events or situations that trigger episodes in adults cannot always be pinpointed as easily as they can in children. °There are 4 phases of cyclic vomiting syndrome: °· Prodrome. The prodrome phase signals that an episode of nausea and vomiting is about to begin. This phase can last from just a few minutes to several hours. This phase is often marked by belly (abdominal) pain. Sometimes taking medicine early in the prodrome phase can stop an episode in progress. However, sometimes there is no warning. A person may simply wake up in the middle of the night or early morning and begin vomiting. °· Episode. The episode phase consists of: °· Severe vomiting. °· Nausea. °· Gagging (retching). °· Recovery. The recovery phase begins when the nausea and vomiting stop. Healthy color, appetite, and energy return. °· Symptom-free interval. The symptom-free interval phase is the period between episodes when no symptoms are present. °TRIGGERS °Episodes can be triggered by an infection or event. Examples of triggers include: °· Infections. °· Colds, allergies, sinus problems, and the  flu. °· Eating certain foods such as chocolate or cheese. °· Foods with monosodium glutamate (MSG) or preservatives. °· Fast foods. °· Pre-packaged foods. °· Foods with low nutritional value (junk foods). °· Overeating. °· Eating just before going to bed. °· Hot weather. °· Dehydration. °· Not enough sleep or poor sleep quality. °· Physical exhaustion. °· Menstruation. °· Motion sickness. °· Emotional stress (school or home difficulties). °· Excitement or stress. °SYMPTOMS  °The main symptoms of cyclic vomiting syndrome are: °· Severe vomiting. °· Nausea. °· Gagging (retching). °Episodes usually begin at night or the first thing in the morning. Episodes may include vomiting or retching up to 5 or 6 times an hour during the worst of the episode. Episodes usually last anywhere from 1 to 4 days. Episodes can last for up to 10 days. Other symptoms include: °· Paleness. °· Exhaustion. °· Listlessness. °· Abdominal pain. °· Loose stools or diarrhea. °Sometimes the nausea and vomiting are so severe that a person appears to be almost unconscious. Sensitivity to light, headache, fever, dizziness, may also accompany an episode. In addition, the vomiting may cause drooling and excessive thirst. Drinking water usually leads to more vomiting, though the water can dilute the acid in the vomit, making the episode a little less painful. Continuous vomiting can lead to dehydration, which means that the body has lost excessive water and salts. °DIAGNOSIS  °Cyclic vomiting syndrome is hard to diagnose because there are no clear tests to identify it. A caregiver must diagnose cyclic vomiting syndrome by looking at symptoms and medical history. A caregiver must exclude more common diseases   or disorders that can also cause nausea and vomiting. Also, diagnosis takes time because caregivers need to identify a pattern or cycle to the vomiting. TREATMENT  Cyclic vomiting syndrome cannot be cured. Treatment varies, but people with cyclic  vomiting syndrome should get plenty of rest and sleep and take medications that prevent, stop, or lessen the vomiting episodes and other symptoms. People whose episodes are frequent and long-lasting may be treated during the symptom-free intervals in an effort to prevent or ease future episodes. The symptom-free phase is a good time to eliminate anything known to trigger an episode. For example, if episodes are brought on by stress or excitement, this period is the time to find ways to reduce stress and stay calm. If sinus problems or allergies cause episodes, those conditions should be treated. The triggers listed above should be avoided or prevented. Because of the similarities between migraine and cyclic vomiting syndrome, caregivers treat some people with severe cyclic vomiting syndrome with drugs that are also used for migraine headaches. The drugs are designed to:  Prevent episodes.  Reduce their frequency.  Lessen their severity. HOME CARE INSTRUCTIONS Once a vomiting episode begins, treatment is supportive. It helps to stay in bed and sleep in a dark, quiet room. Severe nausea and vomiting may require hospitalization and intravenous (IV) fluids to prevent dehydration. Relaxing medications (sedatives) may help if the nausea continues. Sometimes, during the prodrome phase, it is possible to stop an episode from happening altogether. Only take over-the-counter or prescription medicines for pain, discomfort or fever as directed by your caregiver. Do not give aspirin to children. During the recovery phase, drinking water and replacing lost electrolytes (salts in the blood) are very important. Electrolytes are salts that the body needs to function well and stay healthy. Symptoms during the recovery phase can vary. Some people find that their appetites return to normal immediately, while others need to begin by drinking clear liquids and then move slowly to solid food. RELATED COMPLICATIONS The severe  vomiting that defines cyclic vomiting syndrome is a risk factor for several complications:  Dehydration--Vomiting causes the body to lose water quickly.  Electrolyte imbalance--Vomiting also causes the body to lose the important salts it needs to keep working properly.  Peptic esophagitis--The tube that connects the mouth to the stomach (esophagus) becomes injured from the stomach acid that comes up with the vomit.  Hematemesis--The esophagus becomes irritated and bleeds, so blood mixes with the vomit.  Mallory-Weiss tear--The lower end of the esophagus may tear open or the stomach may bruise from vomiting or retching.  Tooth decay--The acid in the vomit can hurt the teeth by corroding the tooth enamel. SEEK MEDICAL CARE IF: You have questions or problems. Document Released: 10/13/2001 Document Revised: 10/27/2011 Document Reviewed: 11/11/2010 [facility] Patient Information 2015 [place], [place]. This information is not intended to replace advice given to you by your health care provider. Make sure you discuss any questions you have with your health care provider.  Malnutrition Many of us think of malnutrition as a condition in which there is not enough to eat. Malnutrition is actually any condition where nutrition is poor. This means:  Too much to eat as we see in conditions of obesity.  Too little to eat with starvation. The following information is only for the malnourished with dietary deficiencies (poor diet). CAUSES  Under-nutrition can result from:  Poor intake.  Malabsorption  Lactation  Bleeding  Diarrhea  Old age.  Kidney failure.  Infancy  Poverty  Infection  Adolescence  Excessive sweating  Drug addiction  Pregnancy  Early childhood. Under-nutrition comes anytime the demand is more than the intake. SYMPTOMS  The problems depend on what type of malnutrition is present. Some general symptoms include:  Fatigue.  Dizziness.  Fainting  Weight  loss.  Poor immune response.  Lack of menstruation.  Lack of growth in children.  Hair loss. DIAGNOSIS  Your caregiver will usually suspect malnutrition based on results of your:   Medical and dietary history.  Physical exam. This will often include measurements of your BMI (body mass index).  Perhaps some blood tests. These may include: plasma levels of nutrients and nutrient-dependent substances, such as:  Hemoglobin  Thyroid hormones  Transferrin  Albumin RISK FACTORS Persons in the following circumstances may be at risk of malnutrition.  Infants and children are at risk of under-nutrition. This is because of their high demand for energy and essential nutrients. Protein-energy malnutrition in children consuming inadequate amounts of protein, calories, and other nutrients is a particularly severe form of under-nutrition that delays growth and development. This includes Marasmus and [place].  Hemorrhagic disease of the newborn is a life-threatening disorder. This is due to a lack of vitamin K, iron, folic acid, vitamin C, copper, zinc, and vitamin A. This may occur in inadequately fed infants and children.  In adolescence, nutritional requirements increase because they are growing. Anorexia nervosa, a form of starvation, may affect adolescents.  Pregnancy and lactation. Requirements for all nutrients are increased during pregnancy and lactation.  Abnormal diets, such as pica (the consumption of nonnutritive substances, such as clay and charcoal), are common in pregnancy.  Anemia due to folic acid deficiency is common in pregnant women. This is especially true for those who have taken oral contraceptives. Folic acid supplements are now recommended for pregnant women. Folic acid prevents neural tube defects (spina bifida) in children.  Breast-fed-only infants may develop vitamin B12 deficiency if the mother is a vegan.  An alcoholic mother may have a handicapped and  stunted child with fetal alcohol syndrome. This is due to the effects of alcohol on the fetus. Do not drink during pregnancy.  Old age: A weakened sense of taste and smell, loneliness, physical and mental handicaps, immobility, and chronic illness can hurt the food intake in the elderly. Absorption is reduced. This may add to iron deficiency, calcium and bone problems and also a softening of the bones due to lack of vitamin D. This is also made worse by not being in the sun.  With aging, we loose lean body mass. These changes and a reduction in physical activity result in lower energy and protein requirements compared with those of younger adults.  Chronic disease including malabsorption states (including those resulting from surgery) tend to impair the absorption of fat-soluble vitamins, vitamin B12, calcium, and iron.  Liver disease impairs the storage of vitamins A and B12. It also interferes with the metabolism of protein and energy sources.  Kidney disease may cause deficiencies of protein, iron, and vitamin D.  Cancer and AIDS may cause anorexia. This is a loss of appetite.  Vegetarian diet. The most common form of this type of diet is when meat and fish are not eaten, but eggs and dairy products are eaten. Iron deficiency is the only risk. Ovo-lacto vegetarians tend to live longer and to develop fewer chronic disabling conditions than their meat-eating peers. However, their lifestyle usually includes regular exercise and abstention from alcohol and tobacco. This may contribute to better health.  Vegans consume no animal products and are susceptible to vitamin B12 deficiency. Yeast extracts and oriental-style fermented foods provide this vitamin. Intake of calcium, iron, and zinc also tends to be low. A fruitarian diet (eat only fruit) is deficient in protein, salt, and many micronutrients. This is not recommended.  Fad diets: Many commercial diets are claimed to enhance well-being or reduce  weight. A physician should be alert to early evidence of nutrient deficiency or toxicity in patients on these diets. Such diets have resulted in vitamin, mineral, and protein deficiency states and cardiac, renal, and metabolic disorders. Some fad diets have resulted in death. People on very low calorie diets (less than 400 kcal/day) cannot sustain health for long. Some trace mineral supplements have induced toxicity.  Alcohol or drug dependency: Addiction leads to a troubled lifestyle in which adequate nourishment is ignored. Absorption and metabolism of nutrients are impaired. High levels of alcohol are poisonous. Too much alcohol can cause tissue injury, particularly of the GI tract, liver, pancreas, brain, and peripheral nervous system. Beer drinkers who consume food may gain weight, but alcoholics who use more than one quart of hard liquor per day lose weight and become undernourished. Drug addicts are usually very skinny. Alcoholism is the most common cause of thiamine deficiency and may lead to deficiencies of magnesium, zinc, and other vitamins. TREATMENT  Get treatment if you experience changes in how your body is working.  PREVENTION  Eating a good, well-balanced diet helps to prevent most forms of malnutrition. Document Released: 06/20/2005 Document Revised: 10/27/2011 Document Reviewed: 07/12/2007 [facility] Patient Information 2015 [place], [place]. This information is not intended to replace advice given to you by your health care provider. Make sure you discuss any questions you have with your health care provider.

## 2014-07-11 NOTE — ED Notes (Signed)
Lab called and stated the lavender top tube had clotted and needed to be redrawn
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
## 2014-07-11 NOTE — ED Notes (Signed)
Patient asked for and received a ginger ale.  

## 2014-07-25 ENCOUNTER — Encounter: Payer: Self-pay | Admitting: Family Medicine

## 2014-07-25 ENCOUNTER — Ambulatory Visit: Payer: Medicaid Other | Attending: Family Medicine | Admitting: Family Medicine

## 2014-07-25 VITALS — BP 112/71 | HR 112 | Temp 99.2°F | Resp 16 | Ht 65.0 in | Wt 109.0 lb

## 2014-07-25 DIAGNOSIS — R05 Cough: Secondary | ICD-10-CM | POA: Insufficient documentation

## 2014-07-25 DIAGNOSIS — F17200 Nicotine dependence, unspecified, uncomplicated: Secondary | ICD-10-CM | POA: Diagnosis not present

## 2014-07-25 DIAGNOSIS — E876 Hypokalemia: Secondary | ICD-10-CM | POA: Insufficient documentation

## 2014-07-25 DIAGNOSIS — E559 Vitamin D deficiency, unspecified: Secondary | ICD-10-CM | POA: Insufficient documentation

## 2014-07-25 DIAGNOSIS — R0789 Other chest pain: Secondary | ICD-10-CM | POA: Insufficient documentation

## 2014-07-25 DIAGNOSIS — R509 Fever, unspecified: Secondary | ICD-10-CM | POA: Insufficient documentation

## 2014-07-25 DIAGNOSIS — F419 Anxiety disorder, unspecified: Secondary | ICD-10-CM | POA: Diagnosis not present

## 2014-07-25 DIAGNOSIS — B9789 Other viral agents as the cause of diseases classified elsewhere: Secondary | ICD-10-CM

## 2014-07-25 DIAGNOSIS — F418 Other specified anxiety disorders: Secondary | ICD-10-CM

## 2014-07-25 DIAGNOSIS — J069 Acute upper respiratory infection, unspecified: Secondary | ICD-10-CM | POA: Diagnosis not present

## 2014-07-25 DIAGNOSIS — B1920 Unspecified viral hepatitis C without hepatic coma: Secondary | ICD-10-CM

## 2014-07-25 LAB — BASIC METABOLIC PANEL
BUN: 8 mg/dL (ref 6–23)
CHLORIDE: 100 meq/L (ref 96–112)
CO2: 24 mEq/L (ref 19–32)
Calcium: 10 mg/dL (ref 8.4–10.5)
Creat: 0.54 mg/dL (ref 0.50–1.10)
GLUCOSE: 89 mg/dL (ref 70–99)
POTASSIUM: 5.3 meq/L (ref 3.5–5.3)
Sodium: 135 mEq/L (ref 135–145)

## 2014-07-25 LAB — PHOSPHORUS: PHOSPHORUS: 4 mg/dL (ref 2.3–4.6)

## 2014-07-25 MED ORDER — GUAIFENESIN ER 600 MG PO TB12: 600.0000 mg | ORAL_TABLET | Freq: Two times a day (BID) | ORAL | Status: DC | PRN

## 2014-07-25 MED ORDER — BENZONATATE 100 MG PO CAPS: 100.0000 mg | ORAL_CAPSULE | Freq: Two times a day (BID) | ORAL | Status: DC | PRN

## 2014-07-25 NOTE — Progress Notes (Signed)
Complaining of cold Sx x 1week Dry cough, elevated temperature

## 2014-07-25 NOTE — Assessment & Plan Note (Signed)
For anxiety symptoms and muscle cramps BZs are effective but are not the safest treatment. Use what you have sparingly. F/u appt to evaluate further and come up with a treatment plan.

## 2014-07-25 NOTE — Patient Instructions (Signed)
[name],  Thank you for coming in today.  You have a viral URI with cough. For this please do the following:  1. Drink plenty of fluids. Hot tea, soup etc will help open your nasal passages. 2. Tessalon perles for cough suppression 3. Mucinex/robitussin (guaifenessin) to break up secretions  F/u for chest pain, shortness of breath, persistent high fever.   For anxiety symptoms and muscle cramps BZs are effective but are not the safest treatment. Use what you have sparingly. If needed schedule f/u specifically for anxiety.   You will be called with lab results.  Dr. Armen PickupFunches

## 2014-07-25 NOTE — Assessment & Plan Note (Signed)
You have a viral URI with cough. For this please do the following:  1. Drink plenty of fluids. Hot tea, soup etc will help open your nasal passages. 2. Tessalon perles for cough suppression 3. Mucinex/robitussin (guaifenessin) to break up secretions  F/u for chest pain, shortness of breath, persistent high fever.

## 2014-07-25 NOTE — Progress Notes (Signed)
   Subjective:    Patient ID: Mary Garrison, female    DOB: January 19, 1991, 23 y.o.   MRN: [id number] CC: cold symptoms x one week   HPI 23 yo F with Hep C  presents for f/u visit:  1. URI with cough: x one week. Improving. Had fever up to 102 F. No sick contacts at home. Chest wall pain when coughing. Otherwise no CP, SOB, N, V.   2. Anxiety: chronic. Was given xanax for hand cramps while in hospital and small Rx. Helps with symptoms. Would like to continue if possible. Has stopped drinking ETOH.   Soc hx:  Current smoker  Review of Systems As per HPI     Objective:   Physical Exam BP 112/71 mmHg  Pulse 112  Temp(Src) 99.2 F (37.3 C) (Oral)  Resp 16  Ht 5\' 5"  (1.651 m)  Wt 109 lb (49.442 kg)  BMI 18.14 kg/m2  SpO2 97%  LMP 01/16/2014 General appearance: alert, cooperative and no distress Ears: normal TM's and external ear canals both ears Nose: Nares normal. Septum midline. Mucosa normal. No drainage or sinus tenderness. Throat: lips, mucosa, and tongue normal; teeth and gums normal  Neck: no adenopathy  Lungs: clear to auscultation bilaterally Heart: regular rate and rhythm, S1, S2 normal, no murmur, click, rub or gallop     Assessment & Plan:

## 2014-07-26 ENCOUNTER — Encounter: Payer: Self-pay | Admitting: Family Medicine

## 2014-07-26 DIAGNOSIS — E559 Vitamin D deficiency, unspecified: Secondary | ICD-10-CM | POA: Insufficient documentation

## 2014-07-26 LAB — VITAMIN D 25 HYDROXY (VIT D DEFICIENCY, FRACTURES): Vit D, 25-Hydroxy: 26 ng/mL — ABNORMAL LOW (ref 30–100)

## 2014-07-26 MED ORDER — CALCIUM CARBONATE-VITAMIN D 500-200 MG-UNIT PO TABS: 1.0000 | ORAL_TABLET | Freq: Two times a day (BID) | ORAL | Status: DC

## 2014-07-26 MED ORDER — POTASSIUM CHLORIDE CRYS ER 20 MEQ PO TBCR: 40.0000 meq | EXTENDED_RELEASE_TABLET | Freq: Every day | ORAL | Status: DC | PRN

## 2014-07-26 MED ORDER — VITAMIN D3 25 MCG (1000 UNIT) PO TABS: 1000.0000 [IU] | ORAL_TABLET | Freq: Every day | ORAL | Status: DC

## 2014-07-26 NOTE — Assessment & Plan Note (Addendum)
A; vit D insuff P: replete with oral vit D 1000 U daily and osal with D 500-200 mg-unit BID

## 2014-07-26 NOTE — Assessment & Plan Note (Signed)
Phos now normal with supplementation and patient no longer abusing alcohol

## 2014-07-26 NOTE — Assessment & Plan Note (Signed)
Normal potassium now. Changed KDUR to 40 mEq daily prn with lasix

## 2014-07-26 NOTE — Addendum Note (Signed)
Addended by: Dessa PhiFUNCHES, Amir Glaus on: 07/26/2014 09:16 AM   Modules accepted: Orders, Medications

## 2014-08-09 ENCOUNTER — Telehealth: Payer: Self-pay | Admitting: *Deleted

## 2014-08-09 NOTE — Telephone Encounter (Signed)
Unable to contact patient.

## 2014-08-09 NOTE — Telephone Encounter (Signed)
-----   Message from Lora PaulaJosalyn C Funches, MD sent at 07/26/2014  9:06 AM EST ----- Potassium is normal. Vit D is insufficient. Will replete  Phos is now normal.

## 2014-08-09 NOTE — Telephone Encounter (Signed)
-----   Message from Lora PaulaJosalyn C Funches, MD sent at 07/26/2014  9:17 AM EST ----- Patient should stop K phos Take KDUR once daily only with lasix. Pick up and start vit D and vit D with calcium supplements.

## 2014-08-20 ENCOUNTER — Inpatient Hospital Stay (HOSPITAL_COMMUNITY)
Admission: EM | Admit: 2014-08-20 | Discharge: 2014-08-21 | DRG: 897 | Disposition: A | Discharge status: Home / Self Care | Payer: Medicaid Other | Attending: Internal Medicine | Admitting: Internal Medicine

## 2014-08-20 ENCOUNTER — Encounter (HOSPITAL_COMMUNITY): Payer: Self-pay | Admitting: Emergency Medicine

## 2014-08-20 DIAGNOSIS — J45909 Unspecified asthma, uncomplicated: Secondary | ICD-10-CM | POA: Diagnosis present

## 2014-08-20 DIAGNOSIS — E46 Unspecified protein-calorie malnutrition: Secondary | ICD-10-CM | POA: Diagnosis present

## 2014-08-20 DIAGNOSIS — A047 Enterocolitis due to Clostridium difficile: Secondary | ICD-10-CM | POA: Diagnosis present

## 2014-08-20 DIAGNOSIS — F10129 Alcohol abuse with intoxication, unspecified: Secondary | ICD-10-CM | POA: Diagnosis present

## 2014-08-20 DIAGNOSIS — F1010 Alcohol abuse, uncomplicated: Secondary | ICD-10-CM | POA: Diagnosis not present

## 2014-08-20 DIAGNOSIS — F1120 Opioid dependence, uncomplicated: Secondary | ICD-10-CM | POA: Diagnosis present

## 2014-08-20 DIAGNOSIS — Z720 Tobacco use: Secondary | ICD-10-CM | POA: Diagnosis present

## 2014-08-20 DIAGNOSIS — Z3202 Encounter for pregnancy test, result negative: Secondary | ICD-10-CM | POA: Diagnosis present

## 2014-08-20 DIAGNOSIS — Z8619 Personal history of other infectious and parasitic diseases: Secondary | ICD-10-CM | POA: Diagnosis present

## 2014-08-20 DIAGNOSIS — K7010 Alcoholic hepatitis without ascites: Secondary | ICD-10-CM | POA: Diagnosis present

## 2014-08-20 DIAGNOSIS — E559 Vitamin D deficiency, unspecified: Secondary | ICD-10-CM

## 2014-08-20 DIAGNOSIS — Z880 Allergy status to penicillin: Secondary | ICD-10-CM | POA: Diagnosis not present

## 2014-08-20 DIAGNOSIS — Y908 Blood alcohol level of 240 mg/100 ml or more: Secondary | ICD-10-CM | POA: Diagnosis present

## 2014-08-20 DIAGNOSIS — Z681 Body mass index (BMI) 19 or less, adult: Secondary | ICD-10-CM | POA: Diagnosis not present

## 2014-08-20 DIAGNOSIS — B1920 Unspecified viral hepatitis C without hepatic coma: Secondary | ICD-10-CM | POA: Diagnosis present

## 2014-08-20 DIAGNOSIS — Z882 Allergy status to sulfonamides status: Secondary | ICD-10-CM

## 2014-08-20 DIAGNOSIS — D696 Thrombocytopenia, unspecified: Secondary | ICD-10-CM | POA: Diagnosis present

## 2014-08-20 DIAGNOSIS — R000 Tachycardia, unspecified: Secondary | ICD-10-CM | POA: Diagnosis present

## 2014-08-20 DIAGNOSIS — F418 Other specified anxiety disorders: Secondary | ICD-10-CM | POA: Diagnosis present

## 2014-08-20 DIAGNOSIS — F1011 Alcohol abuse, in remission: Secondary | ICD-10-CM | POA: Diagnosis present

## 2014-08-20 DIAGNOSIS — F17210 Nicotine dependence, cigarettes, uncomplicated: Secondary | ICD-10-CM | POA: Diagnosis present

## 2014-08-20 DIAGNOSIS — E440 Moderate protein-calorie malnutrition: Secondary | ICD-10-CM | POA: Diagnosis present

## 2014-08-20 DIAGNOSIS — E876 Hypokalemia: Secondary | ICD-10-CM | POA: Diagnosis present

## 2014-08-20 HISTORY — DX: Alcohol dependence, uncomplicated: F10.20

## 2014-08-20 HISTORY — DX: Other psychoactive substance dependence, uncomplicated: F19.20

## 2014-08-20 LAB — CBC WITH DIFFERENTIAL/PLATELET
BASOS ABS: 0 10*3/uL (ref 0.0–0.1)
Basophils Relative: 1 % (ref 0–1)
Eosinophils Absolute: 0.1 10*3/uL (ref 0.0–0.7)
Eosinophils Relative: 1 % (ref 0–5)
HCT: 37.6 % (ref 36.0–46.0)
Hemoglobin: 12.9 g/dL (ref 12.0–15.0)
LYMPHS ABS: 3.5 10*3/uL (ref 0.7–4.0)
Lymphocytes Relative: 54 % — ABNORMAL HIGH (ref 12–46)
MCH: 31.2 pg (ref 26.0–34.0)
MCHC: 34.3 g/dL (ref 30.0–36.0)
MCV: 90.8 fL (ref 78.0–100.0)
MONO ABS: 0.3 10*3/uL (ref 0.1–1.0)
MONOS PCT: 5 % (ref 3–12)
NEUTROS ABS: 2.5 10*3/uL (ref 1.7–7.7)
NEUTROS PCT: 39 % — AB (ref 43–77)
PLATELETS: 181 10*3/uL (ref 150–400)
RBC: 4.14 MIL/uL (ref 3.87–5.11)
RDW: 13.2 % (ref 11.5–15.5)
WBC: 6.4 10*3/uL (ref 4.0–10.5)

## 2014-08-20 LAB — URINALYSIS, ROUTINE W REFLEX MICROSCOPIC
BILIRUBIN URINE: NEGATIVE
Glucose, UA: NEGATIVE mg/dL
Hgb urine dipstick: NEGATIVE
Ketones, ur: NEGATIVE mg/dL
Leukocytes, UA: NEGATIVE
Nitrite: NEGATIVE
PH: 6.5 (ref 5.0–8.0)
Protein, ur: NEGATIVE mg/dL
Specific Gravity, Urine: 1.015 (ref 1.005–1.030)
UROBILINOGEN UA: 0.2 mg/dL (ref 0.0–1.0)

## 2014-08-20 LAB — COMPREHENSIVE METABOLIC PANEL
ALK PHOS: 116 U/L (ref 39–117)
ALT: 34 U/L (ref 0–35)
ANION GAP: 10 (ref 5–15)
AST: 79 U/L — AB (ref 0–37)
Albumin: 3.9 g/dL (ref 3.5–5.2)
BUN: 6 mg/dL (ref 6–23)
CALCIUM: 9 mg/dL (ref 8.4–10.5)
CO2: 29 mmol/L (ref 19–32)
CREATININE: 0.6 mg/dL (ref 0.50–1.10)
Chloride: 96 mEq/L (ref 96–112)
GFR calc Af Amer: >90 mL/min (ref >90)
GFR calc non Af Amer: >90 mL/min (ref >90)
Glucose, Bld: 112 mg/dL — ABNORMAL HIGH (ref 70–99)
Potassium: 3.4 mmol/L — ABNORMAL LOW (ref 3.5–5.1)
Sodium: 135 mmol/L (ref 135–145)
Total Bilirubin: 0.3 mg/dL (ref 0.3–1.2)
Total Protein: 8 g/dL (ref 6.0–8.3)

## 2014-08-20 LAB — PHOSPHORUS: Phosphorus: 3.5 mg/dL (ref 2.3–4.6)

## 2014-08-20 LAB — RAPID URINE DRUG SCREEN, HOSP PERFORMED
Amphetamines: NOT DETECTED
BARBITURATES: NOT DETECTED
BENZODIAZEPINES: POSITIVE — AB
COCAINE: NOT DETECTED
OPIATES: NOT DETECTED
Tetrahydrocannabinol: NOT DETECTED

## 2014-08-20 LAB — MAGNESIUM: Magnesium: 1.6 mg/dL (ref 1.5–2.5)

## 2014-08-20 LAB — PROTIME-INR
INR: 1.18 (ref 0.00–1.49)
Prothrombin Time: 15.1 seconds (ref 11.6–15.2)

## 2014-08-20 LAB — CLOSTRIDIUM DIFFICILE BY PCR: Toxigenic C. Difficile by PCR: POSITIVE — AB

## 2014-08-20 LAB — ETHANOL: Alcohol, Ethyl (B): 337 mg/dL — ABNORMAL HIGH (ref 0–9)

## 2014-08-20 LAB — POC URINE PREG, ED: Preg Test, Ur: NEGATIVE

## 2014-08-20 LAB — GLUCOSE, CAPILLARY: Glucose-Capillary: 95 mg/dL (ref 70–99)

## 2014-08-20 MED ORDER — NICOTINE 14 MG/24HR TD PT24
14.0000 mg | MEDICATED_PATCH | Freq: Every day | TRANSDERMAL | Status: DC
  Administered 2014-08-20 – 2014-08-21 (×2): 14 mg via TRANSDERMAL
  Filled 2014-08-20 (×2): qty 1

## 2014-08-20 MED ORDER — FOLIC ACID 1 MG PO TABS: 1.0000 mg | ORAL_TABLET | Freq: Every day | ORAL | Status: DC

## 2014-08-20 MED ORDER — ONDANSETRON HCL 4 MG/2ML IJ SOLN
4.0000 mg | Freq: Once | INTRAMUSCULAR | Status: AC
  Administered 2014-08-20: 4 mg via INTRAVENOUS
  Filled 2014-08-20: qty 2

## 2014-08-20 MED ORDER — GUAIFENESIN ER 600 MG PO TB12
600.0000 mg | ORAL_TABLET | Freq: Two times a day (BID) | ORAL | Status: DC | PRN
  Filled 2014-08-20: qty 1

## 2014-08-20 MED ORDER — SODIUM CHLORIDE 0.9 % IV SOLN
1000.0000 mL | Freq: Once | INTRAVENOUS | Status: AC
  Administered 2014-08-20: 1000 mL via INTRAVENOUS

## 2014-08-20 MED ORDER — LORAZEPAM 1 MG PO TABS: 1.0000 mg | ORAL_TABLET | Freq: Four times a day (QID) | ORAL | Status: DC | PRN

## 2014-08-20 MED ORDER — ENSURE COMPLETE PO LIQD
237.0000 mL | Freq: Two times a day (BID) | ORAL | Status: DC
  Administered 2014-08-20 – 2014-08-21 (×3): 237 mL via ORAL

## 2014-08-20 MED ORDER — PANTOPRAZOLE SODIUM 40 MG PO TBEC
40.0000 mg | DELAYED_RELEASE_TABLET | Freq: Every day | ORAL | Status: DC
  Administered 2014-08-21: 40 mg via ORAL
  Filled 2014-08-20 (×2): qty 1

## 2014-08-20 MED ORDER — SODIUM CHLORIDE 0.9 % IV BOLUS (SEPSIS)
2000.0000 mL | Freq: Once | INTRAVENOUS | Status: AC
  Administered 2014-08-20: 2000 mL via INTRAVENOUS

## 2014-08-20 MED ORDER — ALBUTEROL SULFATE (2.5 MG/3ML) 0.083% IN NEBU: 3.0000 mL | INHALATION_SOLUTION | RESPIRATORY_TRACT | Status: DC | PRN

## 2014-08-20 MED ORDER — SODIUM CHLORIDE 0.9 % IJ SOLN: 3.0000 mL | Freq: Two times a day (BID) | INTRAMUSCULAR | Status: DC

## 2014-08-20 MED ORDER — PNEUMOCOCCAL VAC POLYVALENT 25 MCG/0.5ML IJ INJ
0.5000 mL | INJECTION | INTRAMUSCULAR | Status: AC
  Administered 2014-08-21: 0.5 mL via INTRAMUSCULAR
  Filled 2014-08-20: qty 0.5

## 2014-08-20 MED ORDER — METRONIDAZOLE 500 MG PO TABS
500.0000 mg | ORAL_TABLET | Freq: Three times a day (TID) | ORAL | Status: DC
  Administered 2014-08-20 – 2014-08-21 (×3): 500 mg via ORAL
  Filled 2014-08-20 (×6): qty 1

## 2014-08-20 MED ORDER — HEPARIN SODIUM (PORCINE) 5000 UNIT/ML IJ SOLN
5000.0000 [IU] | Freq: Three times a day (TID) | INTRAMUSCULAR | Status: DC
  Administered 2014-08-20 – 2014-08-21 (×4): 5000 [IU] via SUBCUTANEOUS
  Filled 2014-08-20 (×4): qty 1

## 2014-08-20 MED ORDER — B-6 100 MG PO TABS: 100.0000 mg | ORAL_TABLET | Freq: Every day | ORAL | Status: DC

## 2014-08-20 MED ORDER — B-1 100 MG PO TABS: 100.0000 mg | ORAL_TABLET | Freq: Every day | ORAL | Status: DC

## 2014-08-20 MED ORDER — POTASSIUM CHLORIDE CRYS ER 20 MEQ PO TBCR
40.0000 meq | EXTENDED_RELEASE_TABLET | Freq: Once | ORAL | Status: AC
  Administered 2014-08-20: 40 meq via ORAL
  Filled 2014-08-20: qty 2

## 2014-08-20 MED ORDER — VITAMIN B-6 100 MG PO TABS
100.0000 mg | ORAL_TABLET | Freq: Every day | ORAL | Status: DC
  Administered 2014-08-21: 100 mg via ORAL
  Filled 2014-08-20 (×2): qty 1

## 2014-08-20 MED ORDER — CALCIUM CARBONATE-VITAMIN D 500-200 MG-UNIT PO TABS
1.0000 | ORAL_TABLET | Freq: Two times a day (BID) | ORAL | Status: DC
  Administered 2014-08-20 – 2014-08-21 (×2): 1 via ORAL
  Filled 2014-08-20 (×4): qty 1

## 2014-08-20 MED ORDER — PANTOPRAZOLE SODIUM 40 MG IV SOLR
40.0000 mg | Freq: Once | INTRAVENOUS | Status: AC
  Administered 2014-08-20: 40 mg via INTRAVENOUS
  Filled 2014-08-20: qty 40

## 2014-08-20 MED ORDER — LORAZEPAM 2 MG/ML IJ SOLN
2.0000 mg | INTRAMUSCULAR | Status: DC | PRN
  Administered 2014-08-20 (×2): 2 mg via INTRAVENOUS
  Filled 2014-08-20 (×3): qty 1

## 2014-08-20 MED ORDER — MIRTAZAPINE 15 MG PO TABS
15.0000 mg | ORAL_TABLET | Freq: Every day | ORAL | Status: DC
  Administered 2014-08-20: 15 mg via ORAL
  Filled 2014-08-20 (×3): qty 1

## 2014-08-20 MED ORDER — ENSURE COMPLETE PO LIQD: 237.0000 mL | Freq: Two times a day (BID) | ORAL | Status: DC

## 2014-08-20 MED ORDER — VITAMIN D3 25 MCG (1000 UNIT) PO TABS
1000.0000 [IU] | ORAL_TABLET | Freq: Every day | ORAL | Status: DC
  Administered 2014-08-21: 1000 [IU] via ORAL
  Filled 2014-08-20 (×2): qty 1

## 2014-08-20 MED ORDER — LORAZEPAM 2 MG/ML IJ SOLN
1.0000 mg | Freq: Four times a day (QID) | INTRAMUSCULAR | Status: DC | PRN
  Administered 2014-08-20: 1 mg via INTRAVENOUS
  Filled 2014-08-20: qty 1

## 2014-08-20 MED ORDER — ONDANSETRON HCL 4 MG/2ML IJ SOLN
4.0000 mg | Freq: Three times a day (TID) | INTRAMUSCULAR | Status: DC | PRN
  Administered 2014-08-20: 4 mg via INTRAVENOUS
  Filled 2014-08-20: qty 2

## 2014-08-20 MED ORDER — B-12 100 MCG PO TABS: 100.0000 mg | ORAL_TABLET | Freq: Every day | ORAL | Status: DC

## 2014-08-20 MED ORDER — THIAMINE HCL 100 MG/ML IJ SOLN
Freq: Once | INTRAVENOUS | Status: AC
  Administered 2014-08-20: 06:00:00 via INTRAVENOUS
  Filled 2014-08-20: qty 1000

## 2014-08-20 MED ORDER — THIAMINE HCL 100 MG/ML IJ SOLN
INTRAVENOUS | Status: DC
  Administered 2014-08-20 – 2014-08-21 (×3): via INTRAVENOUS
  Filled 2014-08-20 (×4): qty 1000

## 2014-08-20 MED ORDER — VITAMIN B-12 100 MCG PO TABS
100.0000 ug | ORAL_TABLET | Freq: Every day | ORAL | Status: DC
  Administered 2014-08-21: 100 ug via ORAL
  Filled 2014-08-20 (×2): qty 1

## 2014-08-20 MED ORDER — VITAMIN B-1 100 MG PO TABS
100.0000 mg | ORAL_TABLET | Freq: Every day | ORAL | Status: DC
  Filled 2014-08-20: qty 1

## 2014-08-20 NOTE — H&P (Signed)
[facility] History and Physical  Joniece Smotherman WJX:[id number] DOB: December 01, 1990 DOA: 08/20/2014  Referring physician: ED physician PCP: Lora Paula, MD  Specialists:   Chief Complaint: alcohol abuse  HPI: Mary Garrison is a 24 y.o. female past medical history of asthma, polysubstance abuse, HCV, who presents with alcohol abuse.   Patient reports that she has Hx of drug abuse.  She has been trying to quit drugs by drinking alcohol in the past 2.5 years ago. She reports that she drinks every day. She drank 12 ounces in noon of yesterday. She felt nauseated and anxious, and had hand shaking. She took 0.5 of Xanax dose. Pt states she drank 1 glass of milk and glass of Gatorade PTA.  She states that she is really thirsty.    She denies fever, chills, headaches, cough, chest pain, SOB, abdominal pain, diarrhea, dysuria, urgency, frequency, hematuria, skin rashes, joint pain or leg swelling.  Work up in the ED demonstrates negative urinalysis for UTI, alcohol level 337, negative pregnancy test, elevated AST 79, ALT 34, normal total bilirubin. Potassium 3.4. Pt is admitted to inpatient for further evaluation and treatment.  Review of Systems: As presented in the history of presenting illness, rest negative.  Where does patient live?  At home with grandmother Can patient participate in ADLs? Yes  Allergy:  Allergies  Allergen Reactions  . Penicillins     unknown  . Sulfa Antibiotics     unknown    Past Medical History  Diagnosis Date  . Hepatitis C 11/16/2013    suspected she contracted it while getting a tatoo. denies IVDU.   Marland Kitchen Asthma     as a child  . Hypophosphatemia 06/29/2014    Phos of 1.4 > 4.0 with supplementation    . Hypokalemia 06/26/2014  . Alcohol addiction   . Polysubstance dependence   . Heroin addiction   . IV drug user     History reviewed. No pertinent past surgical history.  Social History:  reports that she has been smoking Cigarettes.  She has  been smoking about 0.25 packs per day. She has never used smokeless tobacco. She reports that she drinks alcohol. She reports that she does not use illicit drugs.  Family History:  Family History  Problem Relation Age of Onset  . Asthma Mother   . Depression Brother   . Depression Daughter      Prior to Admission medications   Medication Sig Start Date End Date Taking? Authorizing Provider  albuterol (PROVENTIL HFA;VENTOLIN HFA) 108 (90 BASE) MCG/ACT inhaler Inhale 2 puffs into the lungs every 6 (six) hours as needed. For shortness of breath 06/21/14  Yes Josalyn C Funches, MD  calcium-vitamin D (OSCAL WITH D) 500-200 MG-UNIT per tablet Take 1 tablet by mouth 2 (two) times daily. 07/26/14  Yes Josalyn C Funches, MD  cholecalciferol (VITAMIN D) 1000 UNITS tablet Take 1 tablet (1,000 Units total) by mouth daily. 07/26/14  Yes Josalyn C Funches, MD  Cyanocobalamin (B-12 PO) Take 1 tablet by mouth daily.   Yes Historical Provider, MD  folic acid (FOLVITE) 1 MG tablet Take 1 tablet (1 mg total) by mouth daily. 06/28/14  Yes Josalyn C Funches, MD  furosemide (LASIX) 20 MG tablet Take 1 tablet (20 mg total) by mouth daily as needed for fluid. 06/21/14  Yes Josalyn C Funches, MD  mirtazapine (REMERON) 15 MG tablet Take 1 tablet (15 mg total) by mouth at bedtime. Patient taking differently: Take 15 mg by mouth at bedtime.  For sleep 06/21/14  Yes Josalyn C Funches, MD  Multiple Vitamins-Minerals (MULTIVITAMIN PO) Take 1 tablet by mouth daily.   Yes Historical Provider, MD  potassium & sodium phosphates (PHOS-NAK) 280-160-250 MG PACK Take 1 packet by mouth 4 (four) times daily.   Yes Historical Provider, MD  potassium chloride SA (K-DUR,KLOR-CON) 20 MEQ tablet Take 2 tablets (40 mEq total) by mouth daily as needed (with lasix). 07/26/14  Yes Josalyn C Funches, MD  promethazine (PHENERGAN) 25 MG tablet Take 1 tablet (25 mg total) by mouth every 6 (six) hours as needed for nausea or vomiting. 06/21/14  Yes  Josalyn C Funches, MD  Pyridoxine HCl (B-6 PO) Take 1 tablet by mouth daily.   Yes Historical Provider, MD  Thiamine HCl (B-1 PO) Take 1 tablet by mouth daily.   Yes Historical Provider, MD  traMADol (ULTRAM) 50 MG tablet Take 1 tablet (50 mg total) by mouth every 12 (twelve) hours as needed. Patient taking differently: Take 50 mg by mouth every 12 (twelve) hours as needed. For pain 06/21/14  Yes Josalyn C Funches, MD  benzonatate (TESSALON) 100 MG capsule Take 1 capsule (100 mg total) by mouth 2 (two) times daily as needed for cough. Patient not taking: Reported on 08/20/2014 07/25/14   Lora Paula, MD  guaiFENesin (MUCINEX) 600 MG 12 hr tablet Take 1 tablet (600 mg total) by mouth 2 (two) times daily as needed. Patient not taking: Reported on 08/20/2014 07/25/14   Lora Paula, MD  LORazepam (ATIVAN) 1 MG tablet Take 0.5 tablets (0.5 mg total) by mouth every 6 (six) hours as needed (muscle cramps). Patient not taking: Reported on 08/20/2014 07/11/14   Courtney A Forcucci, PA-C  promethazine (PHENERGAN) 25 MG suppository Place 1 suppository (25 mg total) rectally every 6 (six) hours as needed for nausea or vomiting. Patient not taking: Reported on 07/25/2014 07/11/14   Eben Burow, PA-C    Physical Exam: Filed Vitals:   08/20/14 0400 08/20/14 0415 08/20/14 0500 08/20/14 0515  BP: 117/74 114/74 115/85 108/73  Pulse: 113 110 104 106  Temp:      TempSrc:      Resp:  Height:      Weight:      SpO2: 99% 97% 99% 97%   General: Not in acute distress. Anxious looking HEENT:       Eyes: PERRL, EOMI, no scleral icterus       ENT: No discharge from the ears and nose, no pharynx injection, no tonsillar enlargement.        Neck: No JVD, no bruit, no mass felt. Cardiac: S1/S2, RRR, tachycardia, No murmurs, No gallops or rubs Pulm: Good air movement bilaterally. Clear to auscultation bilaterally. No rales, wheezing, rhonchi or rubs. Abd: Soft, nondistended, nontender, no  rebound pain, no organomegaly, BS present Ext: No edema bilaterally. 2+DP/PT pulse bilaterally Musculoskeletal: No joint deformities, erythema, or stiffness, ROM full Skin: No rashes.  Neuro: Alert and oriented X3, cranial nerves II-XII grossly intact, muscle strength 5/5 in all extremeties, sensation to light touch intact. Brachial reflex 2+ bilaterally. Knee reflex 1+ bilaterally. Negative Babinski's sign.  Psych: Patient is not psychotic, no suicidal or hemocidal ideation.  Labs on Admission:  Basic Metabolic Panel:  Recent Labs Lab 08/20/14 0130 08/20/14 0244  NA 135  --   K 3.4*  --   CL 96  --   CO2 29  --   GLUCOSE 112*  --   BUN 6  --  CREATININE 0.60  --   CALCIUM 9.0  --   MG  --  1.6   Liver Function Tests:  Recent Labs Lab 08/20/14 0130  AST 79*  ALT 34  ALKPHOS 116  BILITOT 0.3  PROT 8.0  ALBUMIN 3.9   No results for input(s): LIPASE, AMYLASE in the last 168 hours. No results for input(s): AMMONIA in the last 168 hours. CBC:  Recent Labs Lab 08/20/14 0130  WBC 6.4  NEUTROABS 2.5  HGB 12.9  HCT 37.6  MCV 90.8  PLT 181   Cardiac Enzymes: No results for input(s): CKTOTAL, CKMB, CKMBINDEX, TROPONINI in the last 168 hours.  BNP (last 3 results) No results for input(s): PROBNP in the last 8760 hours. CBG: No results for input(s): GLUCAP in the last 168 hours.  Radiological Exams on Admission: No results found.  EKG: Independently reviewed.   Assessment/Plan Principal Problem:   Alcohol abuse Active Problems:   Depression with anxiety   Asthma   Hepatitis C   Vitamin D insufficiency   Hypokalemia   Malnutrition   Tobacco abuse  Alcohol abuse: Heavy drinking in past 2 and half years. She is at high-risk for withdraw. Alcohol level 337. Patient is anxious, with tachycardia on admission. Currently hemodynamically stable.   -Admit to tele bed - place the patient on an CiWA protocol -will give thiamine and folate -continue home  multiple vitamins - IVF: Received 3 L of normal saline, and banana bag in the ED, will give NS 100cc/h -may consult to Psych. She needs detox  Asthma: Stable. No signs of acute exacerbation -Continue home albuterol inhaler when necessary  Malnutrition Secondary to alcohol abuse: -Check phosphorus level, replete phosphorus if low to avoid refeeding syndrome  Depression: No suicidal or homicidal ideations -Continue Remeron  Hypokalemia: Potassium is 3.4 -Repleted -Follow-up BMP  Smoking: -did counseling -Nicotine patch  Abdomen D deficiency: Vitamin D-25-OH level 26 on 07/25/14. Patient is on vitamin D at home. -Continue home medications  Hepatitis C: Viral load less than 15 on 06/21/14. AST 79, ALT 34, total bilirubin normal. -May give referral to hepatitis C clinic -Avoid Tylenol  DVT ppx: SQ Heparin      Code Status: Full code Family Communication:    Yes, patient's mother and grandmother    at bed side Disposition Plan: Admit to inpatient   Date of Service 08/20/2014    Lorretta Harp [facility] Pager [phone]  If 7PM-7AM, please contact night-coverage [email or web address] Password [email or web address] 08/20/2014, 5:56 AM

## 2014-08-20 NOTE — Progress Notes (Signed)
CRITICAL VALUE ALERT  Critical value received:  Positive C-Diff PCR  Date of notification:  08/20/14   Time of notification:  2015  Critical value read back:Yes.    Nurse who received alert:  Burley Saver, RN  MD notified (1st page):  Benedetto Coons  Time of first page:  2018  Responding MD:  Benedetto Coons, Pt initiated on p.o flagyl   Time MD responded:  2023

## 2014-08-20 NOTE — ED Notes (Signed)
Admitting MD at bedside.

## 2014-08-20 NOTE — ED Notes (Signed)
Attempted to give report 

## 2014-08-20 NOTE — ED Notes (Signed)
MD at bedside. 

## 2014-08-20 NOTE — ED Notes (Signed)
Pt. requesting detox from alcohol addiction last drink this morning , denies suicidal ideation , no hallucinations . Respirations unlabored.

## 2014-08-20 NOTE — Clinical Social Work Psychosocial (Signed)
Clinical Social Work Department BRIEF PSYCHOSOCIAL ASSESSMENT 08/20/2014  Patient:  Mary Garrison, Mary Garrison     Account Number:  [id number]     Admit date:  08/20/2014  Clinical Social Worker:  Hubert Azure  Date/Time:  08/20/2014 05:15 PM  Referred by:  Physician  Date Referred:  08/20/2014 Referred for  Substance Abuse   Other Referral:   ETOH abuse   Interview type:  Patient Other interview type:    PSYCHOSOCIAL DATA Living Status:  FAMILY Admitted from facility:   Level of care:   Primary support name:  Glade Lloyd [phone]) Primary support relationship to patient:  FAMILY Degree of support available:   Good, patient lives with maternal grandmother    CURRENT CONCERNS Current Concerns  Substance Abuse   Other Concerns:    SOCIAL WORK ASSESSMENT / PLAN CSW met with patient and introduced self. CSW explained role and discussed ETOH abuse. Per patient, she began drinking approximately 2 years ago when she quit using heroine. Patient states she used heroine heavily for 6 years and then decided she wanted to stop. Patient states she drinks 1/5 alcohol per day. Per patient, she recently moved to live with her grandparents from [place] and has no friends in [place]. Patient states all of her [facility] friends drink or use drugs. Patient denies harming anyone or harming herself while intoxicated. Patient states she is motivated to stop drinking because she "is over it." Patient denies any health complications related to ETOH or drug abuse. CSW discussed health implications of abusing ETOH and illegal substances. Patient appears unmoved by health implications and states she is just "over it already." Patient is agreeable to a supportive community once she leaves the hospital.  Patient reports she has been to [employer] meeting before and is agreeable to attending the meetings for support.   Assessment/plan status:  Information/Referral to [employer] Other assessment/ plan:    Information/referral to community resources:   CSW provided patient with list of community outpatient substance abuse resources to include AA meetings.    PATIENT'S/FAMILY'S RESPONSE TO PLAN OF CARE: Patient was actively withdrawing and was visibly shaking. Patient states she needs help as she is "over" drinking everyday.    [place], [place] Weekend Clinical Social Worker [phone]

## 2014-08-20 NOTE — Progress Notes (Signed)
Patient admitted 2 hours ago for alcohol intoxication, she has history of alcohol abuse, remote history of IV drug use, she is hep C positive. Counseled to quit alcohol and smoking.  She was seen and examined in the room, in no distress, only subjective complaint mild diarrhea, family members bedside updated. She will be on CIWA protocol, likely discharge in 24-48 hours if in no frank DTs.  UA and urine drug screen negative. Urine drug screen positive for benzodiazepine, alcohol level 337.   For her hep C have requested her to follow with ID clinic post discharge.

## 2014-08-20 NOTE — ED Provider Notes (Signed)
CSN: [id number]     Arrival date & time 08/20/14  0101 History  This chart was scribed for Axel Frisk Smitty Cords, MD by Abel Presto, ED Scribe. This patient was seen in room D30C/D30C and the patient's care was started at 2:27 AM.    Chief Complaint  Patient presents with  . Alcohol Problem     Patient is a 24 y.o. female presenting with alcohol problem. The history is provided by the patient. No language interpreter was used.  Alcohol Problem    HPI Comments: Mary Garrison is a 24 y.o. female who presents to the Emergency Department complaining of EtOH abuse. Pt states Hx of drug abuse.  She notes she quit using drugs by drinking starting 2.5 years ago.  Pt states she drank 12 ounces today.  Pt notes she can drink 1/2 gallon of liquor in 2 days. Pt took 0.5 of a Xanax dose today. Pt has PMHx of anorexia. Pt notes she last ate 3 days ago. Pt states she drank 1 glass of milk and glass of Gatorade PTA.  Pt notes she is really thirsty.  She notes associated dizziness.   Past Medical History  Diagnosis Date  . Hepatitis C 11/16/2013    suspected she contracted it while getting a tatoo. denies IVDU.   Marland Kitchen Asthma     as a child  . Hypophosphatemia 06/29/2014    Phos of 1.4 > 4.0 with supplementation    . Hypokalemia 06/26/2014  . Alcohol addiction   . Polysubstance dependence   . Heroin addiction   . IV drug user    History reviewed. No pertinent past surgical history. Family History  Problem Relation Age of Onset  . Asthma Mother   . Depression Brother   . Depression Daughter    History  Substance Use Topics  . Smoking status: Current Every Day Smoker -- 0.25 packs/day    Types: Cigarettes  . Smokeless tobacco: Never Used  . Alcohol Use: Yes   OB History    No data available     Review of Systems  Neurological: Positive for dizziness.      Allergies  Penicillins and Sulfa antibiotics  Home Medications   Prior to Admission medications   Medication Sig Start Date  End Date Taking? Authorizing Provider  albuterol (PROVENTIL HFA;VENTOLIN HFA) 108 (90 BASE) MCG/ACT inhaler Inhale 2 puffs into the lungs every 6 (six) hours as needed. For shortness of breath 06/21/14  Yes Josalyn C Funches, MD  calcium-vitamin D (OSCAL WITH D) 500-200 MG-UNIT per tablet Take 1 tablet by mouth 2 (two) times daily. 07/26/14  Yes Josalyn C Funches, MD  cholecalciferol (VITAMIN D) 1000 UNITS tablet Take 1 tablet (1,000 Units total) by mouth daily. 07/26/14  Yes Josalyn C Funches, MD  Cyanocobalamin (B-12 PO) Take 1 tablet by mouth daily.   Yes Historical Provider, MD  folic acid (FOLVITE) 1 MG tablet Take 1 tablet (1 mg total) by mouth daily. 06/28/14  Yes Josalyn C Funches, MD  furosemide (LASIX) 20 MG tablet Take 1 tablet (20 mg total) by mouth daily as needed for fluid. 06/21/14  Yes Josalyn C Funches, MD  mirtazapine (REMERON) 15 MG tablet Take 1 tablet (15 mg total) by mouth at bedtime. Patient taking differently: Take 15 mg by mouth at bedtime. For sleep 06/21/14  Yes Josalyn C Funches, MD  Multiple Vitamins-Minerals (MULTIVITAMIN PO) Take 1 tablet by mouth daily.   Yes Historical Provider, MD  potassium & sodium phosphates (PHOS-NAK) 280-160-250 MG  PACK Take 1 packet by mouth 4 (four) times daily.   Yes Historical Provider, MD  potassium chloride SA (K-DUR,KLOR-CON) 20 MEQ tablet Take 2 tablets (40 mEq total) by mouth daily as needed (with lasix). 07/26/14  Yes Josalyn C Funches, MD  promethazine (PHENERGAN) 25 MG tablet Take 1 tablet (25 mg total) by mouth every 6 (six) hours as needed for nausea or vomiting. 06/21/14  Yes Josalyn C Funches, MD  Pyridoxine HCl (B-6 PO) Take 1 tablet by mouth daily.   Yes Historical Provider, MD  Thiamine HCl (B-1 PO) Take 1 tablet by mouth daily.   Yes Historical Provider, MD  traMADol (ULTRAM) 50 MG tablet Take 1 tablet (50 mg total) by mouth every 12 (twelve) hours as needed. Patient taking differently: Take 50 mg by mouth every 12 (twelve) hours as  needed. For pain 06/21/14  Yes Josalyn C Funches, MD  benzonatate (TESSALON) 100 MG capsule Take 1 capsule (100 mg total) by mouth 2 (two) times daily as needed for cough. Patient not taking: Reported on 08/20/2014 07/25/14   Lora Paula, MD  guaiFENesin (MUCINEX) 600 MG 12 hr tablet Take 1 tablet (600 mg total) by mouth 2 (two) times daily as needed. Patient not taking: Reported on 08/20/2014 07/25/14   Lora Paula, MD  LORazepam (ATIVAN) 1 MG tablet Take 0.5 tablets (0.5 mg total) by mouth every 6 (six) hours as needed (muscle cramps). Patient not taking: Reported on 08/20/2014 07/11/14   Courtney A Forcucci, PA-C  promethazine (PHENERGAN) 25 MG suppository Place 1 suppository (25 mg total) rectally every 6 (six) hours as needed for nausea or vomiting. Patient not taking: Reported on 07/25/2014 07/11/14   Toni Amend A Forcucci, PA-C   BP 117/79 mmHg  Pulse 11  Temp(Src) 98.4 F (36.9 C) (Oral)  Resp 18  Ht  (1.651 m)  Wt 110 lb (49.896 kg)  BMI 18.31 kg/m2  SpO2 100%  LMP 08/06/2014 Physical Exam  Constitutional: She is oriented to person, place, and time.  Pt cachectic  HENT:  Head: Normocephalic.  Mouth/Throat: Oropharynx is clear and moist.  Eyes: Conjunctivae and EOM are normal. Pupils are equal, round, and reactive to light.  Neck: Normal range of motion. Neck supple.  Cardiovascular: Normal heart sounds.  Tachycardia present.  Exam reveals no friction rub.   No murmur heard. Pulmonary/Chest: Effort normal and breath sounds normal. No respiratory distress. She has no wheezes. She has no rales.  Abdominal: Soft. Bowel sounds are normal.  Musculoskeletal: Normal range of motion.  Neurological: She is alert and oriented to person, place, and time.  Skin: Skin is warm and dry.  Stigmata on face consistent with liver disease  Psychiatric: She has a normal mood and affect. Her behavior is normal.  Nursing note and vitals reviewed.   ED Course  Procedures (including  critical care time) DIAGNOSTIC STUDIES: Oxygen Saturation is 97% on room air, normal by my interpretation.    COORDINATION OF CARE: 2:30 AM Discussed treatment plan with patient at beside, the patient agrees with the plan and has no further questions at this time.   Labs Review Labs Reviewed  ETHANOL - Abnormal; Notable for the following:    Alcohol, Ethyl (B) 337 (*)    All other components within normal limits  CBC WITH DIFFERENTIAL - Abnormal; Notable for the following:    Neutrophils Relative % 39 (*)    Lymphocytes Relative 54 (*)    All other components within normal limits  COMPREHENSIVE  METABOLIC PANEL - Abnormal; Notable for the following:    Potassium 3.4 (*)    Glucose, Bld 112 (*)    AST 79 (*)    All other components within normal limits  URINALYSIS, ROUTINE W REFLEX MICROSCOPIC - Abnormal; Notable for the following:    APPearance CLOUDY (*)    All other components within normal limits  URINE RAPID DRUG SCREEN (HOSP PERFORMED)  MAGNESIUM  POC URINE PREG, ED    Imaging Review No results found.   EKG Interpretation None      MDM   Final diagnoses:  None    I personally performed the services described in this documentation, which was scribed in my presence. The recorded information has been reviewed and is accurate.      Jasmine Awe, MD 08/29/14 [phone]

## 2014-08-21 DIAGNOSIS — J4520 Mild intermittent asthma, uncomplicated: Secondary | ICD-10-CM

## 2014-08-21 DIAGNOSIS — F1010 Alcohol abuse, uncomplicated: Principal | ICD-10-CM

## 2014-08-21 LAB — CBC
HCT: 33.1 % — ABNORMAL LOW (ref 36.0–46.0)
Hemoglobin: 11 g/dL — ABNORMAL LOW (ref 12.0–15.0)
MCH: 30.4 pg (ref 26.0–34.0)
MCHC: 33.2 g/dL (ref 30.0–36.0)
MCV: 91.4 fL (ref 78.0–100.0)
PLATELETS: 114 10*3/uL — AB (ref 150–400)
RBC: 3.62 MIL/uL — AB (ref 3.87–5.11)
RDW: 13 % (ref 11.5–15.5)
WBC: 3.9 10*3/uL — ABNORMAL LOW (ref 4.0–10.5)

## 2014-08-21 LAB — COMPREHENSIVE METABOLIC PANEL
ALK PHOS: 111 U/L (ref 39–117)
ALT: 32 U/L (ref 0–35)
ANION GAP: 7 (ref 5–15)
AST: 89 U/L — ABNORMAL HIGH (ref 0–37)
Albumin: 3.3 g/dL — ABNORMAL LOW (ref 3.5–5.2)
BUN: <5 mg/dL — ABNORMAL LOW (ref 6–23)
CALCIUM: 8.6 mg/dL (ref 8.4–10.5)
CHLORIDE: 108 meq/L (ref 96–112)
CO2: 22 mmol/L (ref 19–32)
Creatinine, Ser: 0.53 mg/dL (ref 0.50–1.10)
GFR calc non Af Amer: >90 mL/min (ref >90)
GLUCOSE: 99 mg/dL (ref 70–99)
Potassium: 3.7 mmol/L (ref 3.5–5.1)
Sodium: 137 mmol/L (ref 135–145)
Total Bilirubin: 1 mg/dL (ref 0.3–1.2)
Total Protein: 6.9 g/dL (ref 6.0–8.3)

## 2014-08-21 LAB — GLUCOSE, CAPILLARY: Glucose-Capillary: 104 mg/dL — ABNORMAL HIGH (ref 70–99)

## 2014-08-21 MED ORDER — METRONIDAZOLE 500 MG PO TABS: 500.0000 mg | ORAL_TABLET | Freq: Three times a day (TID) | ORAL | Status: DC

## 2014-08-21 MED ORDER — ALPRAZOLAM 0.5 MG PO TABS: 0.5000 mg | ORAL_TABLET | Freq: Two times a day (BID) | ORAL | Status: DC | PRN

## 2014-08-21 MED ORDER — IBUPROFEN 400 MG PO TABS
400.0000 mg | ORAL_TABLET | Freq: Four times a day (QID) | ORAL | Status: DC | PRN
  Filled 2014-08-21: qty 1

## 2014-08-21 MED ORDER — ENSURE COMPLETE PO LIQD: 237.0000 mL | Freq: Three times a day (TID) | ORAL | Status: DC

## 2014-08-21 NOTE — Progress Notes (Signed)
INITIAL NUTRITION ASSESSMENT  DOCUMENTATION CODES Per approved criteria  -Not Applicable   INTERVENTION: Provide Ensure Complete po TID, each supplement provides 350 kcal and 13 grams of protein.  Provide nourishment snack (carnation instant breakfast). Ordered.  Encourage adequate PO intake.   NUTRITION DIAGNOSIS: Inadequate oral intake related to decreased appetite as evidenced by meal completion of 25%.   Goal: Pt to meet >/= 90% of their estimated nutrition needs   Monitor:  PO intake, weight trends, labs, I/O's  Reason for Assessment: MST  24 y.o. female  Admitting Dx: Alcohol abuse  ASSESSMENT: Pt past medical history of asthma, polysubstance abuse, HCV, who presents with alcohol abuse. Pt heavy drinking in past 2.5 years. Pt on CIWA.  Pt positive for C-diff. Pt reports her appetite has been improving. Meal completion has been 25%. Pt reports she has not been eating well PTA. Her last full meal was last Wednesday 12/30. Since then, she reports she has only been drinking fluids of Ensure at least 1-2 times daily and carnation instant breakfast in the AM. Pt reports having weight loss with her usual body weight of 130 lbs 8 months ago. Noted pt with a 14.6% weight loss in 8 months. Pt reports she would like Ensure and Carnation instant breakfast ordered. RD to order. Pt was encouraged to eat her food at meals and to drink her supplements.   Pt with no observed significant fat or muscle mass loss.  Labs: Low BUN. High AST.  Height: Ht Readings from Last 1 Encounters:  08/20/14  (1.651 m)    Weight: Wt Readings from Last 1 Encounters:  08/20/14 111 lb 8.8 oz (50.6 kg)    Ideal Body Weight: 125 lbs  % Ideal Body Weight: 89%  Wt Readings from Last 10 Encounters:  08/20/14 111 lb 8.8 oz (50.6 kg)  07/25/14 109 lb (49.442 kg)  06/21/14 104 lb (47.174 kg)  02/25/14 110 lb (49.896 kg)    Usual Body Weight: 130 lbs (pt reports 8 months ago)  % Usual Body  Weight: 85%  BMI:  Body mass index is 18.56 kg/(m^2).  Estimated Nutritional Needs: Kcal: 1800-2000 Protein: 65-85 grams Fluid: 1.8 - 2 L/day  Skin: intact  Diet Order: Diet regular  EDUCATION NEEDS: -No education needs identified at this time   Intake/Output Summary (Last 24 hours) at 08/21/14 0950 Last data filed at 08/20/14 1858  Gross per 24 hour  Intake 1628.34 ml  Output    500 ml  Net 1128.34 ml    Last BM: PTA  Labs:   Recent Labs Lab 08/20/14 0130 08/20/14 0244 08/20/14 0820 08/21/14 0619  NA 135  --   --  137  K 3.4*  --   --  3.7  CL 96  --   --  108  CO2 29  --   --  22  BUN 6  --   --  <5*  CREATININE 0.60  --   --  0.53  CALCIUM 9.0  --   --  8.6  MG  --  1.6  --   --   PHOS  --   --  3.5  --   GLUCOSE 112*  --   --  99    CBG (last 3)   Recent Labs  08/20/14 0813 08/21/14 0807  GLUCAP 95 104*    Scheduled Meds: . calcium-vitamin D  1 tablet Oral BID  . cholecalciferol  1,000 Units Oral Daily  . feeding supplement (ENSURE  COMPLETE)  237 mL Oral BID BM  . heparin  5,000 Units Subcutaneous 3 times per day  . metroNIDAZOLE  500 mg Oral 3 times per day  . mirtazapine  15 mg Oral QHS  . nicotine  14 mg Transdermal Daily  . pantoprazole  40 mg Oral Q1200  . pneumococcal 23 valent vaccine  0.5 mL Intramuscular Tomorrow-1000  . vitamin B-6  100 mg Oral Daily  . sodium chloride  3 mL Intravenous Q12H  . vitamin B-12  100 mcg Oral Daily    Continuous Infusions: . banana bag IV 1000 mL 100 mL/hr at 08/21/14 0032    Past Medical History  Diagnosis Date  . Hepatitis C 11/16/2013    suspected she contracted it while getting a tatoo. denies IVDU.   Marland Kitchen Asthma     as a child  . Hypophosphatemia 06/29/2014    Phos of 1.4 > 4.0 with supplementation    . Hypokalemia 06/26/2014  . Alcohol addiction   . Polysubstance dependence   . Heroin addiction   . IV drug user     History reviewed. No pertinent past surgical history.  Marijean Niemann, MS, RD, LDN Pager # [phone] After hours/ weekend pager # [phone]

## 2014-08-21 NOTE — Progress Notes (Signed)
Patient ID: Mary Garrison, female   DOB: Feb 01, 1991, 24 y.o.   MRN: [id number]  [facility] PROGRESS NOTE  Mary Garrison JXB:[id number] DOB: 1991/07/04 DOA: 08/20/2014 PCP: Lora Paula, MD  Brief narrative: 24 y.o. female with asthma, polysubstance abuse, HCV, who presented with alcohol abuse, drinks every day. She denied fever, chills, headaches, cough, chest pain, SOB, abdominal pain, dysuria, urgency, frequency, hematuria, skin rashes, joint pain or leg swelling.  Work up in the ED demonstrated negative urinalysis for UTI, alcohol level 337, negative pregnancy test, elevated AST 79, ALT 34, normal total bilirubin. Potassium 3.4. Pt is admitted to inpatient service for further evaluation and treatment.  Assessment and Plan:    Principal Problem:   Alcohol abuse - still with tremors at rest but clinically stable this AM - continue to keep on CIWA protocol - check alcohol level in AM - consultation on cessation provided  Active Problems:   Diarrhea - C. Diff + - continue Flagyl day #2   Depression with anxiety - appears to be stable at this time   Transaminitis - from alcohol induced hepatitis - repeat CMET in AM   Hepatitis C   Hypokalemia - continue to supplement as indicated  - repeat BMP in AM   Malnutrition, moderate - in the setting of heavy alcohol use and poor nutritional habits   Tobacco abuse - consulted on cessation   Thrombocytopenia - likely partially from bone marrow damage in the setting of alcohol abuse - repeat CBC in AM  DVT prophylaxis  Heparin SQ while pt is in hospital  Code Status: Full Family Communication: Pt at bedside Disposition Plan: Home when medically stable   IV Access:   Peripheral IV Procedures and diagnostic studies:    No results found.  Medical Consultants:   None  Other Consultants:   None  Anti-Infectives:   Flagyl   Debbora Presto, MD  [facility] Pager [phone]  If 7PM-7AM, please contact  night-coverage [email or web address] Password [email or web address] 08/21/2014, 2:00 PM   LOS: 1 day   HPI/Subjective: No events overnight.   Objective: Filed Vitals:   08/20/14 1311 08/20/14 1843 08/20/14 2043 08/21/14 0529  BP: 124/86 120/89 113/70 114/65  Pulse: 100 107 102 95  Temp: 98.1 F (36.7 C) 98.8 F (37.1 C) 98.1 F (36.7 C) 98.3 F (36.8 C)  TempSrc: Oral Oral Oral Oral  Resp: Height:      Weight:   50.6 kg (111 lb 8.8 oz)   SpO2: 99% 100% 100% 100%    Intake/Output Summary (Last 24 hours) at 08/21/14 1400 Last data filed at 08/20/14 1858  Gross per 24 hour  Intake 908.34 ml  Output      0 ml  Net 908.34 ml    Exam:   General:  Pt is alert, follows commands appropriately, not in acute distress  Cardiovascular: Regular rate and rhythm, S1/S2, no murmurs, no rubs, no gallops  Respiratory: Clear to auscultation bilaterally, no wheezing, no crackles, no rhonchi  Abdomen: Soft, non tender, non distended, bowel sounds present, no guarding  Data Reviewed: Basic Metabolic Panel:  Recent Labs Lab 08/20/14 0130 08/20/14 0244 08/20/14 0820 08/21/14 0619  NA 135  --   --  137  K 3.4*  --   --  3.7  CL 96  --   --  108  CO2 29  --   --  22  GLUCOSE 112*  --   --  99  BUN 6  --   --  <  5*  CREATININE 0.60  --   --  0.53  CALCIUM 9.0  --   --  8.6  MG  --  1.6  --   --   PHOS  --   --  3.5  --    Liver Function Tests:  Recent Labs Lab 08/20/14 0130 08/21/14 0619  AST 79* 89*  ALT 34 32  ALKPHOS 116 111  BILITOT 0.3 1.0  PROT 8.0 6.9  ALBUMIN 3.9 3.3*   CBC:  Recent Labs Lab 08/20/14 0130 08/21/14 0619  WBC 6.4 3.9*  NEUTROABS 2.5  --   HGB 12.9 11.0*  HCT 37.6 33.1*  MCV 90.8 91.4  PLT 181 114*   CBG:  Recent Labs Lab 08/20/14 0813 08/21/14 0807  GLUCAP 95 104*    Recent Results (from the past 240 hour(s))  Clostridium Difficile by PCR     Status: Abnormal   Collection Time: 08/20/14 12:58 PM  Result Value Ref Range Status   C  difficile by pcr POSITIVE (A) NEGATIVE Final     Scheduled Meds: . heparin  5,000 Units Subcutaneous 3 times per day  . metroNIDAZOLE  500 mg Oral 3 times per day  . mirtazapine  15 mg Oral QHS  . nicotine  14 mg Transdermal Daily  . pantoprazole  40 mg Oral Q1200   Continuous Infusions: . banana bag IV 1000 mL 100 mL/hr at 08/21/14 1142

## 2014-08-21 NOTE — Discharge Instructions (Signed)
Alcohol and Nutrition °Nutrition serves two purposes. It provides energy. It also maintains body structure and function. Food supplies energy. It also provides the building blocks needed to replace worn or damaged cells. Alcoholics often eat poorly. This limits their supply of essential nutrients. This affects energy supply and structure maintenance. Alcohol also affects the body's nutrients in: °· Digestion. °· Storage. °· Using and getting rid of waste products. °IMPAIRMENT OF NUTRIENT DIGESTION AND UTILIZATION  °· Once ingested, food must be broken down into small components (digested). Then it is available for energy. It helps maintain body structure and function. Digestion begins in the mouth. It continues in the stomach and intestines, with help from the pancreas. The nutrients from digested food are absorbed from the intestines into the blood. Then they are carried to the liver. The liver prepares nutrients for: °¨ Immediate use. °¨ Storage and future use. °· Alcohol inhibits the breakdown of nutrients into usable molecules. °¨ It decreases secretion of digestive enzymes from the pancreas. °· Alcohol impairs nutrient absorption by damaging the cells lining the stomach and intestines. °· It also interferes with moving some nutrients into the blood. °· In addition, nutritional deficiencies themselves may lead to further absorption problems. °¨ For example, folate deficiency changes the cells that line the small intestine. This impairs how water is absorbed. It also affects absorbed nutrients. These include glucose, sodium, and additional folate. °· Even if nutrients are digested and absorbed, alcohol can prevent them from being fully used. It changes their transport, storage, and excretion. Impaired utilization of nutrients by alcoholics is indicated by: °¨ Decreased liver stores of vitamins, such as vitamin A. °¨ Increased excretion of nutrients such as fat. °ALCOHOL AND ENERGY SUPPLY  °· Three basic  nutritional components found in food are: °¨ Carbohydrates. °¨ Proteins. °¨ Fats. °· These are used as energy. Some alcoholics take in as much as 50% of their total daily calories from alcohol. They often neglect important foods. °· Even when enough food is eaten, alcohol can impair the ways the body controls blood sugar (glucose) levels. It may either increase or decrease blood sugar. °¨ In non-diabetic alcoholics, increased blood sugar (hyperglycemia) is caused by poor insulin secretion. It is usually temporary. °¨ Decreased blood sugar (hypoglycemia) can cause serious injury even if this condition is short-lived. Low blood sugar can happen when a fasting or malnourished person drinks alcohol. When there is no food to supply energy, stored sugar is used up. The products of alcohol inhibit forming glucose from other compounds such as amino acids. As a result, alcohol causes the brain and other body tissue to lack glucose. It is needed for energy and function. °· Alcohol is an energy source. But how the body processes and uses the energy from alcohol is complex. Also, when alcohol is substituted for carbohydrates, subjects tend to lose weight. This indicates that they get less energy from alcohol than from food. °ALCOHOL - MAINTAINING CELL STRUCTURE AND FUNCTION  °Structure °Cells are made mostly of protein. So an adequate protein diet is important for maintaining cell structure. This is especially true if cells are being damaged. Research indicates that alcohol affects protein nutrition by causing impaired: °· Digestion of proteins to amino acids. °· Processing of amino acids by the small intestine and liver. °· Synthesis of proteins from amino acids. °· Protein secretion by the liver. °Function °Nutrients are essential for the body to function well. They provide the tools that the body needs to work well:  °·   Proteins. °· Vitamins. °· Minerals. °Alcohol can disrupt body function. It may cause nutrient  deficiencies. And it may interfere with the way nutrients are processed. °Vitamins °· Vitamins are essential to maintain growth and normal metabolism. They regulate many of the body`s processes. Chronic heavy drinking causes deficiencies in many vitamins. This is caused by eating less. And, in some cases, vitamins may be poorly absorbed. For example, alcohol inhibits fat absorption. It impairs how the vitamins A, E, and D are normally absorbed along with dietary fats. Not enough vitamin A may cause night blindness. Not enough vitamin D may cause softening of the bones. °· Some alcoholics lack vitamins A, C, D, E, K, and the B vitamins. These are all involved in wound healing and cell maintenance. In particular, because vitamin K is necessary for blood clotting, lacking that vitamin can cause delayed clotting. The result is excess bleeding. Lacking other vitamins involved in brain function may cause severe neurological damage. °Minerals °Deficiencies of minerals such as calcium, magnesium, iron, and zinc are common in alcoholics. The alcohol itself does not seem to affect how these minerals are absorbed. Rather, they seem to occur secondary to other alcohol-related problems, such as: °· Less calcium absorbed. °· Not enough magnesium. °· More urinary excretion. °· Vomiting. °· Diarrhea. °· Not enough iron due to gastrointestinal bleeding. °· Not enough zinc or losses related to other nutrient deficiencies. °· Mineral deficiencies can cause a variety of medical consequences. These range from calcium-related bone disease to zinc-related night blindness and skin lesions. °ALCOHOL, MALNUTRITION, AND MEDICAL COMPLICATIONS  °Liver Disease  °· Alcoholic liver damage is caused primarily by alcohol itself. But poor nutrition may increase the risk of alcohol-related liver damage. For example, nutrients normally found in the liver are known to be affected by drinking alcohol. These include carotenoids, which are the major  sources of vitamin A, and vitamin E compounds. Decreases in such nutrients may play some role in alcohol-related liver damage. °Pancreatitis °· Research suggests that malnutrition may increase the risk of developing alcoholic pancreatitis. Research suggests that a diet lacking in protein may increase alcohol's damaging effect on the pancreas. °Brain °· Nutritional deficiencies may have severe effects on brain function. These may be permanent. Specifically, thiamine deficiencies are often seen in alcoholics. They can cause severe neurological problems. These include: °¨ Impaired movement. °¨ Memory loss seen in Wernicke-Korsakoff syndrome. °Pregnancy °· Alcohol has toxic effects on fetal development. It causes alcohol-related birth defects. They include fetal alcohol syndrome. Alcohol itself is toxic to the fetus. Also, the nutritional deficiency can affect how the fetus develops. That may compound the risk of developmental damage. °· Nutritional needs during pregnancy are 10% to 30% greater than normal. Food intake can increase by as much as 140% to cover the needs of both mother and fetus. An alcoholic mother`s nutritional problems may adversely affect the nutrition of the fetus. And alcohol itself can also restrict nutrition flow to the fetus. °NUTRITIONAL STATUS OF ALCOHOLICS  °Techniques for assessing nutritional status include: °· Taking body measurements to estimate fat reserves. They include: °¨ Weight. °¨ Height. °¨ Mass. °¨ Skin fold thickness. °· Performing blood analysis to provide measurements of circulating: °¨ Proteins. °¨ Vitamins. °¨ Minerals. °· These techniques tend to be imprecise. For many nutrients, there is no clear "cut-off" point that would allow an accurate definition of deficiency. So assessing the nutritional status of alcoholics is limited by these techniques. Dietary status may provide information about the risk of developing nutritional problems.   Dietary status is assessed by: °¨ Taking  patients' dietary histories. °¨ Evaluating the amount and types of food they are eating. °· It is difficult to determine what exact amount of alcohol begins to have damaging effects on nutrition. In general, moderate drinkers have 2 drinks or less per day. They seem to be at little risk for nutritional problems. Various medical disorders begin to appear at greater levels. °· Research indicates that the majority of even the heaviest drinkers have few obvious nutritional deficiencies. Many alcoholics who are hospitalized for medical complications of their disease do have severe malnutrition. Alcoholics tend to eat poorly. Often they eat less than the amounts of food necessary to provide enough: °¨ Carbohydrates. °¨ Protein. °¨ Fat. °¨ Vitamins A and C. °¨ B vitamins. °¨ Minerals like calcium and iron. °Of major concern is alcohol's effect on digesting food and use of nutrients. It may shift a mildly malnourished person toward severe malnutrition. °Document Released: 05/29/2005 Document Revised: 10/27/2011 Document Reviewed: 11/12/2005 °[employer]® Patient Information ©2015 [employer]. This information is not intended to replace advice given to you by your health care provider. Make sure you discuss any questions you have with your health care provider. ° °

## 2014-08-21 NOTE — Discharge Summary (Signed)
Physician Discharge Summary  Mary Garrison ZDG:[id number] DOB: 03-03-1991 DOA: 08/20/2014  PCP: Lora Paula, MD  Admit date: 08/20/2014 Discharge date: 08/21/2014  Recommendations for Outpatient Follow-up:  1. Pt will need to follow up with PCP in 2-3 weeks post discharge 2. Please obtain BMP to evaluate electrolytes and kidney function 3. Please also check CBC to evaluate Hg and Hct levels 4. Please note pt requested to go home later in the evening 08/21/2013 5. Advised to pick up the medication that was called in to her pharmacy for Flagyl for 12 more days   Discharge Diagnoses:  Principal Problem:   Alcohol abuse Active Problems:   Depression with anxiety   Asthma   Hepatitis C   Vitamin D insufficiency   Hypokalemia   Malnutrition   Tobacco abuse    Discharge Condition: Stable  Diet recommendation: Heart healthy diet discussed in details   Brief narrative: 24 y.o. female with asthma, polysubstance abuse, HCV, who presented with alcohol abuse, drinks every day. She denied fever, chills, headaches, cough, chest pain, SOB, abdominal pain, dysuria, urgency, frequency, hematuria, skin rashes, joint pain or leg swelling.  Work up in the ED demonstrated negative urinalysis for UTI, alcohol level 337, negative pregnancy test, elevated AST 79, ALT 34, normal total bilirubin. Potassium 3.4. Pt is admitted to inpatient service for further evaluation and treatment.  Assessment and Plan:    Principal Problem:  Alcohol abuse - consultation on cessation provided  Active Problems:  Diarrhea - C. Diff + - continue Flagyl for 12 more days   Depression with anxiety - appears to be stable at this time  Transaminitis - from alcohol induced hepatitis - repeat CMET in AM  Hepatitis C  Hypokalemia - continue to supplement as indicated  - repeat BMP in AM  Malnutrition, moderate - in the setting of heavy alcohol use and poor nutritional habits  Tobacco abuse -  consulted on cessation  Thrombocytopenia - likely partially from bone marrow damage in the setting of alcohol abuse  DVT prophylaxis  Heparin SQ while pt is in hospital  Code Status: Full Family Communication: Pt at bedside Disposition Plan: Home    IV Access:    Peripheral IV Procedures and diagnostic studies:      Imaging Results (Last 48 hours)    No results found.   Medical Consultants:    None  Other Consultants:    None  Anti-Infectives:    Flagyl   Discharge Exam: Filed Vitals:   08/21/14 0529  BP: 114/65  Pulse: 95  Temp: 98.3 F (36.8 C)  Resp: 18   Filed Vitals:   08/20/14 1311 08/20/14 1843 08/20/14 2043 08/21/14 0529  BP: 124/86 120/89 113/70 114/65  Pulse: 100 107 102 95  Temp: 98.1 F (36.7 C) 98.8 F (37.1 C) 98.1 F (36.7 C) 98.3 F (36.8 C)  TempSrc: Oral Oral Oral Oral  Resp: Height:      Weight:   50.6 kg (111 lb 8.8 oz)   SpO2: 99% 100% 100% 100%    General: Pt is alert, follows commands appropriately, not in acute distress Cardiovascular: Regular rate and rhythm, S1/S2 +, no murmurs, no rubs, no gallops Respiratory: Clear to auscultation bilaterally, no wheezing, no crackles, no rhonchi Abdominal: Soft, non tender, non distended, bowel sounds +, no guarding Extremities: no edema, no cyanosis, pulses palpable bilaterally DP and PT Neuro: Grossly nonfocal  Discharge Instructions  Discharge Instructions    Diet - low sodium  heart healthy    Complete by:  As directed      Increase activity slowly    Complete by:  As directed             Medication List    TAKE these medications        albuterol 108 (90 BASE) MCG/ACT inhaler  Commonly known as:  PROVENTIL HFA;VENTOLIN HFA  Inhale 2 puffs into the lungs every 6 (six) hours as needed. For shortness of breath     ALPRAZolam 0.5 MG tablet  Commonly known as:  XANAX  Take 1 tablet (0.5 mg total) by mouth 2 (two) times daily as needed for anxiety.      B-1 PO  Take 1 tablet by mouth daily.     B-12 PO  Take 1 tablet by mouth daily.     B-6 PO  Take 1 tablet by mouth daily.     benzonatate 100 MG capsule  Commonly known as:  TESSALON  Take 1 capsule (100 mg total) by mouth 2 (two) times daily as needed for cough.     calcium-vitamin D 500-200 MG-UNIT per tablet  Commonly known as:  OSCAL WITH D  Take 1 tablet by mouth 2 (two) times daily.     cholecalciferol 1000 UNITS tablet  Commonly known as:  VITAMIN D  Take 1 tablet (1,000 Units total) by mouth daily.     folic acid 1 MG tablet  Commonly known as:  FOLVITE  Take 1 tablet (1 mg total) by mouth daily.     furosemide 20 MG tablet  Commonly known as:  LASIX  Take 1 tablet (20 mg total) by mouth daily as needed for fluid.     guaiFENesin 600 MG 12 hr tablet  Commonly known as:  MUCINEX  Take 1 tablet (600 mg total) by mouth 2 (two) times daily as needed.     LORazepam 1 MG tablet  Commonly known as:  ATIVAN  Take 0.5 tablets (0.5 mg total) by mouth every 6 (six) hours as needed (muscle cramps).     metroNIDAZOLE 500 MG tablet  Commonly known as:  FLAGYL  Take 1 tablet (500 mg total) by mouth every 8 (eight) hours.     mirtazapine 15 MG tablet  Commonly known as:  REMERON  Take 1 tablet (15 mg total) by mouth at bedtime.     MULTIVITAMIN PO  Take 1 tablet by mouth daily.     potassium & sodium phosphates 280-160-250 MG Pack  Commonly known as:  PHOS-NAK  Take 1 packet by mouth 4 (four) times daily.     potassium chloride SA 20 MEQ tablet  Commonly known as:  K-DUR,KLOR-CON  Take 2 tablets (40 mEq total) by mouth daily as needed (with lasix).     promethazine 25 MG tablet  Commonly known as:  PHENERGAN  Take 1 tablet (25 mg total) by mouth every 6 (six) hours as needed for nausea or vomiting.     promethazine 25 MG suppository  Commonly known as:  PHENERGAN  Place 1 suppository (25 mg total) rectally every 6 (six) hours as needed for nausea or  vomiting.     traMADol 50 MG tablet  Commonly known as:  ULTRAM  Take 1 tablet (50 mg total) by mouth every 12 (twelve) hours as needed.           Follow-up Information    Follow up with Judyann Munson, MD. Schedule an appointment as soon as possible for  a visit in 1 week.   Specialty:  Infectious Diseases   Contact information:   [address] [place] [address] [phone]       Follow up with Lora Paula, MD. Schedule an appointment as soon as possible for a visit in 1 week.   Specialty:  Family Medicine   Contact information:   [address] [place] [address] [phone]       Follow up with Debbora Presto, MD.   Specialty:  Internal Medicine   Why:  As needed, If symptoms worsen   Contact information:   [address] [place] [address] [phone]        The results of significant diagnostics from this hospitalization (including imaging, microbiology, ancillary and laboratory) are listed below for reference.     Microbiology: Recent Results (from the past 240 hour(s))  Clostridium Difficile by PCR     Status: Abnormal   Collection Time: 08/20/14 12:58 PM  Result Value Ref Range Status   C difficile by pcr POSITIVE (A) NEGATIVE Final    Comment: CRITICAL RESULT CALLED TO, READ BACK BY AND VERIFIED WITH: MOORE,S RN 08/20/14 2014 WOOTEN,K      Labs: Basic Metabolic Panel:  Recent Labs Lab 08/20/14 0130 08/20/14 0244 08/20/14 0820 08/21/14 0619  NA 135  --   --  137  K 3.4*  --   --  3.7  CL 96  --   --  108  CO2 29  --   --  22  GLUCOSE 112*  --   --  99  BUN 6  --   --  <5*  CREATININE 0.60  --   --  0.53  CALCIUM 9.0  --   --  8.6  MG  --  1.6  --   --   PHOS  --   --  3.5  --    Liver Function Tests:  Recent Labs Lab 08/20/14 0130 08/21/14 0619  AST 79* 89*  ALT 34 32  ALKPHOS 116 111  BILITOT 0.3 1.0  PROT 8.0 6.9  ALBUMIN 3.9 3.3*   No results for input(s): LIPASE,  AMYLASE in the last 168 hours. No results for input(s): AMMONIA in the last 168 hours. CBC:  Recent Labs Lab 08/20/14 0130 08/21/14 0619  WBC 6.4 3.9*  NEUTROABS 2.5  --   HGB 12.9 11.0*  HCT 37.6 33.1*  MCV 90.8 91.4  PLT 181 114*   Cardiac Enzymes: No results for input(s): CKTOTAL, CKMB, CKMBINDEX, TROPONINI in the last 168 hours. BNP: BNP (last 3 results) No results for input(s): PROBNP in the last 8760 hours. CBG:  Recent Labs Lab 08/20/14 0813 08/21/14 0807  GLUCAP 95 104*     SIGNED: Time coordinating discharge: Over 30 minutes  Debbora Presto, MD  [facility] 08/21/2014, 6:06 PM Pager [phone]  If 7PM-7AM, please contact night-coverage [email or web address] Password [email or web address]

## 2014-08-21 NOTE — Progress Notes (Signed)
Chaplain responded to spiritual Care consult to offer support to patient but pt. Refused.

## 2014-08-21 NOTE — Progress Notes (Signed)
Patient Discharge:  Disposition: Calm and cooperative, stated she was ready to go home  Education: Education and discharge information reviewed with pt and pt's grandmother. Stated no further questions at this time.   IV: IV'd DC'd   Telemetry: N/A  Follow-up appointments: Follow-up appointments reviewed with pt   Prescriptions: Prescriptions given to and reviewed with pt  Transportation: Pt to be transported home with grandmother  Belongings: Belongings in pt possession

## 2014-08-24 ENCOUNTER — Ambulatory Visit: Payer: Medicaid Other | Attending: Family Medicine | Admitting: Family Medicine

## 2014-08-24 ENCOUNTER — Encounter: Payer: Self-pay | Admitting: Family Medicine

## 2014-08-24 VITALS — BP 103/70 | HR 112 | Temp 98.1°F | Resp 16 | Ht 65.0 in | Wt 113.0 lb

## 2014-08-24 DIAGNOSIS — F419 Anxiety disorder, unspecified: Secondary | ICD-10-CM | POA: Diagnosis present

## 2014-08-24 DIAGNOSIS — Z720 Tobacco use: Secondary | ICD-10-CM | POA: Insufficient documentation

## 2014-08-24 DIAGNOSIS — F1010 Alcohol abuse, uncomplicated: Secondary | ICD-10-CM | POA: Insufficient documentation

## 2014-08-24 DIAGNOSIS — A047 Enterocolitis due to Clostridium difficile: Secondary | ICD-10-CM

## 2014-08-24 DIAGNOSIS — A0472 Enterocolitis due to Clostridium difficile, not specified as recurrent: Secondary | ICD-10-CM

## 2014-08-24 DIAGNOSIS — B1920 Unspecified viral hepatitis C without hepatic coma: Secondary | ICD-10-CM

## 2014-08-24 DIAGNOSIS — F418 Other specified anxiety disorders: Secondary | ICD-10-CM

## 2014-08-24 HISTORY — DX: Enterocolitis due to Clostridium difficile, not specified as recurrent: A04.72

## 2014-08-24 MED ORDER — METOPROLOL TARTRATE 25 MG PO TABS: 12.5000 mg | ORAL_TABLET | Freq: Two times a day (BID) | ORAL | Status: DC

## 2014-08-24 MED ORDER — ESCITALOPRAM OXALATE 10 MG PO TABS: 10.0000 mg | ORAL_TABLET | Freq: Every day | ORAL | Status: DC

## 2014-08-24 MED ORDER — TRAZODONE HCL 50 MG PO TABS: 25.0000 mg | ORAL_TABLET | Freq: Every evening | ORAL | Status: DC | PRN

## 2014-08-24 NOTE — Patient Instructions (Signed)
[name],  Thank you for coming back in to see me today.  1. Anxiety: Start lexpro 10 mg in the morning Start trazodone 25-50 mg at night.  Metoprolol 12.5 mg twice daily to control pulse  [facility].   2. C. Diff- finish flagyl. Drink plenty of water.  No lasix or potassium to prevent dehydration.    F/u in 4 weeks   Dr. Armen PickupFunches

## 2014-08-24 NOTE — Progress Notes (Signed)
Pt comes in to discuss medication changes with provider Taking Xanax/Remeron and Tramadol for pain  C/o back pain radiating to knee Tachy- 112

## 2014-08-24 NOTE — Progress Notes (Signed)
   Subjective:    Patient ID: Mary Garrison, female    DOB: [birth year], 24 y.o.   MRN: [id number] CC: anxiety and ETOH abuse  HPI 24 yo F with Hep C presents to discuss the following:  1. Anxiety: persistent. She has quit alcohol and is "serious" about it now. She has trouble resting and staying calm. She feels sad at time. No SI. Has good home support. Willing to try therapy and medication for anxiety.   Soc Hx: current smoker  Review of Systems As per HPI     Objective:   Physical Exam BP 103/70 mmHg  Pulse 112  Temp(Src) 98.1 F (36.7 C) (Oral)  Resp 16  Ht 5\' 5"  (1.651 m)  Wt 113 lb (51.256 kg)  BMI 18.80 kg/m2  SpO2 100%  LMP 08/06/2014 (Approximate) General appearance: alert, cooperative and no distress Neck: no adenopathy, no carotid bruit, no JVD, supple, symmetrical, trachea midline and thyroid: normal to inspection and palpation Lungs: clear to auscultation bilaterally Heart: S1, S2 normal, tachy heart rate        Assessment & Plan:

## 2014-08-25 NOTE — Assessment & Plan Note (Addendum)
A: Anxiety with ETOH abuse. Untreated. P: Start lexpro 10 mg in the morning Start trazodone 25-50 mg at night.  Metoprolol 12.5 mg twice daily to control pulse  [facility]- [facility].  Get TSH at next visit to r/o concomitant thyroid disorder

## 2014-09-06 ENCOUNTER — Other Ambulatory Visit: Payer: Medicaid Other

## 2014-09-06 DIAGNOSIS — B1710 Acute hepatitis C without hepatic coma: Secondary | ICD-10-CM

## 2014-09-06 LAB — IRON: Iron: 110 ug/dL (ref 42–145)

## 2014-09-07 LAB — ANA: Anti Nuclear Antibody(ANA): NEGATIVE

## 2014-09-07 LAB — HEPATITIS B CORE ANTIBODY, TOTAL: HEP B C TOTAL AB: NONREACTIVE

## 2014-09-07 LAB — HEPATITIS B SURFACE ANTIBODY,QUALITATIVE: HEP B S AB: POSITIVE — AB

## 2014-09-07 LAB — HEPATITIS A ANTIBODY, TOTAL: Hep A Total Ab: NONREACTIVE

## 2014-09-07 LAB — HEPATITIS B SURFACE ANTIGEN: Hepatitis B Surface Ag: NEGATIVE

## 2014-09-11 LAB — HCV RNA QUANT RFLX ULTRA OR GENOTYP
HCV QUANT LOG: 4.91 {Log} — AB (ref <1.18)
HCV Quantitative: 80528 IU/mL — ABNORMAL HIGH (ref <15)

## 2014-09-13 LAB — HEPATITIS C GENOTYPE

## 2014-09-22 ENCOUNTER — Ambulatory Visit: Payer: Medicaid Other | Admitting: Family Medicine

## 2014-10-09 ENCOUNTER — Ambulatory Visit: Payer: Medicaid Other | Attending: Family Medicine | Admitting: Family Medicine

## 2014-10-09 ENCOUNTER — Encounter: Payer: Self-pay | Admitting: Family Medicine

## 2014-10-09 VITALS — BP 118/78 | HR 102 | Temp 99.2°F | Resp 16 | Ht 65.0 in | Wt 112.0 lb

## 2014-10-09 DIAGNOSIS — K219 Gastro-esophageal reflux disease without esophagitis: Secondary | ICD-10-CM

## 2014-10-09 DIAGNOSIS — A0472 Enterocolitis due to Clostridium difficile, not specified as recurrent: Secondary | ICD-10-CM

## 2014-10-09 DIAGNOSIS — B1920 Unspecified viral hepatitis C without hepatic coma: Secondary | ICD-10-CM | POA: Diagnosis not present

## 2014-10-09 DIAGNOSIS — F418 Other specified anxiety disorders: Secondary | ICD-10-CM

## 2014-10-09 DIAGNOSIS — R000 Tachycardia, unspecified: Secondary | ICD-10-CM

## 2014-10-09 DIAGNOSIS — R634 Abnormal weight loss: Secondary | ICD-10-CM

## 2014-10-09 DIAGNOSIS — F1010 Alcohol abuse, uncomplicated: Secondary | ICD-10-CM | POA: Diagnosis not present

## 2014-10-09 DIAGNOSIS — F419 Anxiety disorder, unspecified: Secondary | ICD-10-CM | POA: Diagnosis present

## 2014-10-09 DIAGNOSIS — A047 Enterocolitis due to Clostridium difficile: Secondary | ICD-10-CM

## 2014-10-09 DIAGNOSIS — F17200 Nicotine dependence, unspecified, uncomplicated: Secondary | ICD-10-CM | POA: Diagnosis not present

## 2014-10-09 LAB — TSH: TSH: 0.976 u[IU]/mL (ref 0.350–4.500)

## 2014-10-09 MED ORDER — ESCITALOPRAM OXALATE 20 MG PO TABS: 20.0000 mg | ORAL_TABLET | Freq: Every day | ORAL | Status: DC

## 2014-10-09 MED ORDER — ALPRAZOLAM 0.5 MG PO TABS: 0.5000 mg | ORAL_TABLET | Freq: Two times a day (BID) | ORAL | Status: DC | PRN

## 2014-10-09 MED ORDER — TRAMADOL HCL 50 MG PO TABS: 50.0000 mg | ORAL_TABLET | Freq: Two times a day (BID) | ORAL | Status: DC | PRN

## 2014-10-09 MED ORDER — RANITIDINE HCL 150 MG PO TABS: 150.0000 mg | ORAL_TABLET | Freq: Two times a day (BID) | ORAL | Status: DC | PRN

## 2014-10-09 NOTE — Assessment & Plan Note (Signed)
Improved Checking TSH

## 2014-10-09 NOTE — Assessment & Plan Note (Signed)
A: improved still drinking P:  [facility]

## 2014-10-09 NOTE — Assessment & Plan Note (Signed)
Resolved

## 2014-10-09 NOTE — Patient Instructions (Addendum)
[name],  Thank you for coming in today.  1. Anxiety: Increase lexapro to 20 mg daily Refilled xanax Continue trazodone at night   2. Hep C:  I am excited that you are getting treatment. You are in excellent hands at the ID clinic.   3. Elevated heart rate: Most likely anxiety and some dehydration Sent in zantac for GERD Drink plenty or water Checking TSH, checking on thyroid  F/u in 3 months sooner if needed for anxiety  Dr. Armen PickupFunches
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
## 2014-10-09 NOTE — Assessment & Plan Note (Signed)
Hep C:  I am excited that you are getting treatment. You are in excellent hands at the ID clinic.

## 2014-10-09 NOTE — Progress Notes (Signed)
Pt is here following up on her asthma and her anxiety. Pt states that her knees were painful w/ swelling. Pt reports having occasional chest pain that is getting worse.

## 2014-10-09 NOTE — Assessment & Plan Note (Signed)
Anxiety: Increase lexapro to 20 mg daily Refilled xanax Continue trazodone at night

## 2014-10-09 NOTE — Assessment & Plan Note (Signed)
Zantac prn for now

## 2014-10-11 ENCOUNTER — Encounter: Payer: Self-pay | Admitting: Internal Medicine

## 2014-10-11 ENCOUNTER — Ambulatory Visit (INDEPENDENT_AMBULATORY_CARE_PROVIDER_SITE_OTHER): Payer: Medicaid Other | Admitting: Internal Medicine

## 2014-10-11 VITALS — BP 110/75 | HR 89 | Temp 98.2°F | Ht 65.0 in | Wt 116.0 lb

## 2014-10-11 DIAGNOSIS — Z23 Encounter for immunization: Secondary | ICD-10-CM

## 2014-10-11 DIAGNOSIS — B182 Chronic viral hepatitis C: Secondary | ICD-10-CM

## 2014-10-11 DIAGNOSIS — F1010 Alcohol abuse, uncomplicated: Secondary | ICD-10-CM | POA: Diagnosis not present

## 2014-10-11 NOTE — Addendum Note (Signed)
Addended by: Wendall MolaOCKERHAM, Cyann Venti A on: 10/11/2014 04:46 PM   Modules accepted: Orders

## 2014-10-11 NOTE — Progress Notes (Signed)
+Mary Garrison is a 24 y.o. female who presents for initial evaluation and management of a positive Hepatitis C antibody test.  Patient tested positive last year. Hepatitis C risk factors present are: tattoos (details: denies other risk factors). Patient denies accidental needle stick, history of blood transfusion, intranasal drug use, IV drug abuse, renal dialysis, sexual contact with person with liver disease. Patient has had other studies performed. Results: hepatitis C RNA by PCR, result: positive. Patient has not had prior treatment for Hepatitis C. Patient does not have a past history of liver disease. Patient does not have a family history of liver disease.   HPI: She was recently hospitalized largely due to alcohol abuse.  She is interested in quitting but is struggling with that.    Patient does not have documented immunity to Hepatitis A. Patient does have documented immunity to Hepatitis B.     Review of Systems A comprehensive review of systems was negative.   Past Medical History  Diagnosis Date  . Hepatitis C 11/16/2013    suspected she contracted it while getting a tatoo. denies IVDU.   Marland Kitchen. Asthma     as a child  . Hypophosphatemia 06/29/2014    Phos of 1.4 > 4.0 with supplementation    . Hypokalemia 06/26/2014  . Alcohol addiction   . Polysubstance dependence   . Heroin addiction   . IV drug user   . C. difficile diarrhea 08/24/2014    Prior to Admission medications   Medication Sig Start Date End Date Taking? Authorizing Provider  albuterol (PROVENTIL HFA;VENTOLIN HFA) 108 (90 BASE) MCG/ACT inhaler Inhale 2 puffs into the lungs every 6 (six) hours as needed. For shortness of breath 06/21/14   Lora PaulaJosalyn C Funches, MD  ALPRAZolam (XANAX) 0.5 MG tablet Take 1 tablet (0.5 mg total) by mouth 2 (two) times daily as needed for anxiety. 10/09/14   Lora PaulaJosalyn C Funches, MD  calcium-vitamin D (OSCAL WITH D) 500-200 MG-UNIT per tablet Take 1 tablet by mouth 2 (two) times daily. 07/26/14    Lora PaulaJosalyn C Funches, MD  cholecalciferol (VITAMIN D) 1000 UNITS tablet Take 1 tablet (1,000 Units total) by mouth daily. 07/26/14   Josalyn C Funches, MD  Cyanocobalamin (B-12 PO) Take 1 tablet by mouth daily.    Historical Provider, MD  escitalopram (LEXAPRO) 20 MG tablet Take 1 tablet (20 mg total) by mouth daily. 10/09/14   Lora PaulaJosalyn C Funches, MD  folic acid (FOLVITE) 1 MG tablet Take 1 tablet (1 mg total) by mouth daily. 06/28/14   Lora PaulaJosalyn C Funches, MD  metoprolol tartrate (LOPRESSOR) 25 MG tablet Take 0.5 tablets (12.5 mg total) by mouth 2 (two) times daily. 08/24/14   Lora PaulaJosalyn C Funches, MD  Multiple Vitamins-Minerals (MULTIVITAMIN PO) Take 1 tablet by mouth daily.    Historical Provider, MD  potassium & sodium phosphates (PHOS-NAK) 280-160-250 MG PACK Take 1 packet by mouth 4 (four) times daily.    Historical Provider, MD  Pyridoxine HCl (B-6 PO) Take 1 tablet by mouth daily.    Historical Provider, MD  ranitidine (ZANTAC) 150 MG tablet Take 1 tablet (150 mg total) by mouth 2 (two) times daily as needed for heartburn. 10/09/14   Lora PaulaJosalyn C Funches, MD  Thiamine HCl (B-1 PO) Take 1 tablet by mouth daily.    Historical Provider, MD  traMADol (ULTRAM) 50 MG tablet Take 1 tablet (50 mg total) by mouth every 12 (twelve) hours as needed. 10/09/14   Lora PaulaJosalyn C Funches, MD  traZODone (DESYREL) 50 MG  tablet Take 0.5-1 tablets (25-50 mg total) by mouth at bedtime as needed for sleep. 08/24/14   Lora Paula, MD    Allergies  Allergen Reactions  . Penicillins     unknown  . Sulfa Antibiotics     unknown    History  Substance Use Topics  . Smoking status: Current Every Day Smoker -- 0.50 packs/day for 10 years    Types: Cigarettes  . Smokeless tobacco: Never Used  . Alcohol Use: Yes     Comment: 1.5th per day or more    Family History  Problem Relation Age of Onset  . Asthma Mother   . Depression Brother   . Other Brother   . Depression Sister   . Other Sister       Objective:  There  were no vitals filed for this visit. in no apparent distress and alert HEENT: anicteric Cor RRR and No murmurs clear Bowel sounds are normal, liver is not enlarged, spleen is not enlarged peripheral pulses normal, no pedal edema, no clubbing or cyanosis negative for - jaundice, spider hemangioma, telangiectasia, palmar erythema, ecchymosis and atrophy  Laboratory Genotype:  Lab Results  Component Value Date   HCVGENOTYPE 1b 09/06/2014   HCV viral load:  Lab Results  Component Value Date   HCVQUANT 96045* 09/06/2014   Lab Results  Component Value Date   WBC 3.9* 08/21/2014   HGB 11.0* 08/21/2014   HCT 33.1* 08/21/2014   MCV 91.4 08/21/2014   PLT 114* 08/21/2014    Lab Results  Component Value Date   CREATININE 0.53 08/21/2014   BUN <5* 08/21/2014   NA 137 08/21/2014   K 3.7 08/21/2014   CL 108 08/21/2014   CO2 22 08/21/2014    Lab Results  Component Value Date   ALT 32 08/21/2014   AST 89* 08/21/2014   ALKPHOS 111 08/21/2014   BILITOT 1.0 08/21/2014   INR 1.18 08/20/2014      Assessment: Chronic Hepatitis C genotype 1b  Plan: 1) Patient counseled extensively on limiting acetaminophen to no more than 2 grams daily, avoidance of alcohol. 2) Transmission discussed with patient including sexual transmission, sharing razors and toothbrush.   3) Will need referral to gastroenterology if concern for cirrhosis 4) Will need referral for substance abuse counseling: Yes. She will need to make a commitment to abstinence and get into counseling with our SA counselor.   5) Will prescribe Harvoni or Delmar Landau for 12 weeks once work up complete and in counseling 6) Hepatitis A vaccine Yes.   7) Hepatitis B vaccine No. 8) Pneumovax vaccine if concern for cirrhosis 9) will follow up after she is doing well off of alcohol.  Anticipate 2-3 months if she is in counseling and will do elastography at that time.

## 2014-10-11 NOTE — Patient Instructions (Signed)
Date 10/11/2014  Dear Ms Chase CallerVarga, As discussed in the [facility] Clinic, your hepatitis C therapy will include the following medications:         Delmar LandauViekira Pak  Please note that ALL MEDICATIONS WILL START ON THE SAME DATE for a total of 12 weeks. ---------------------------------------------------------------- Your HCV Treatment Start Date: TBA   Your HCV genotype:  1b    Liver Fibrosis: TBD    ---------------------------------------------------------------- YOUR PHARMACY CONTACT:   [facility] [address] Phone: [phone] Hours: Monday to Friday 7:30 am to 6:00 pm   Please always contact your pharmacy at least 3-4 business days before you run out of medications to ensure your next month's medication is ready or 1 week prior to running out if you receive it by mail.  Remember, each prescription is for 28 days. ---------------------------------------------------------------- GENERAL NOTES REGARDING YOUR HEPATITIS C MEDICATION:  VIEKIRA PAK contains 2 different types of tablets. You must take both types of tablets exactly as prescribed, to treat your chronic hepatitis C virus (HCV) infection. . the pink tablet contains: the medicines ombitasvir, paritaprevir, and ritonavir . the beige tablet contains: the medicine dasabuvir  How should I take VIEKIRA PAK? Marland Kitchen. When you receive your VIEKIRA PAK prescription, you will get a monthly carton that contains enough medicine for 28 days. . Each monthly carton of VIEKIRA PAK contains 4 smaller cartons.  . Each of the 4 smaller cartons contains enough child resistant daily dose packs of medicine to last for 7 days (1 week). . Each daily dose pack contains all of your VIEKIRA PAK medicine for 1 day (4 tablets). Follow the instructions on each daily dose pack about how to remove the tablets. . Take VIEKIRA PAK tablets with a meal as follows: ? take the 2 pink tablets (ombitasvir,  paritaprevir, and ritonavir), with 1 of the beige tablets (dasabuvir), at about the same time every morning. ? take the second beige tablet (dasabuvir), at about the same time every evening. . If you miss a dose of the pink tablets, and it is less than 12 hours from the time you usually take your dose, take the missed dose with a meal as soon as possible. Then take your next dose at your usual time with a meal. . If you miss a dose of the pink tablets, and it is more than 12 hours from the time you usually take your dose, do not take the missed dose. Take your next dose at your usual time with a meal. . If you miss a dose of the beige tablet, and it is less than 6 hours from the time you usually take your dose, take the missed dose with a meal as soon as possible. Then take your next dose at your usual time with a meal. . If you miss a dose of the beige tablet, and it is more than 6 hours from the time you usually take your dose, do not take the missed dose. Take your next dose at your usual time with a meal. . Do not take more than your prescribed dose of VIEKIRA PAK to make up for a missed dose. . If you take too much VIEKIRA PAK, cal  Ribavirin is an additional medication required and is taken twice a day. -3 capsules in the morning -2 or 3 capsules in the evening  Common side effects: 1. Nausea 2. Itching 3. Insomnia 4. Fatigue  Do not take with the  following drugs: alfuzosin HCL; colchicine; carbamazepine, phenytoin, phenobarbital; gemfibrozil; rifampin; ergotamine, dihydroergotamine, ergonovine, methylergonovine; ethinyl estradiol-containing medicines, such as combined oral contraceptives; St. John's Wort (Hypericum perforatum); lovastatin, simvastatin; pimozide; efavirenz; sildenafil (when dosed as Revatio* for pulmonary arterial hypertension); triazolam and oral midazolam.  Call [phone]) for financial assistance in certain  cases. ---------------------------------------------------------------- GENERAL HELPFUL HINTS ON HCV THERAPY: 1. No alcohol. 2. Protect against sun-sensitivity/sunburns (wear sunglasses, hat, long sleeves, pants and sunscreen). 3. Stay well-hydrated/well-moisturized. 4. Notify the ID Clinic of any changes in your other over-the-counter/herbal or prescription medications. 5. If you miss a dose of your medication, take the missed dose as soon as you remember. Return to your regular time/dose schedule the next day.  6.  Do not stop taking your medications without first talking with your healthcare provider. 7.  You may take Tylenol (acetaminophen), as long as the dose is less than 2000 mg (OR no more than 4 tablets of the Tylenol Extra Strengths  tablet) in 24 hours. 8.  You will need to obtain routine labs and/or office visits at [facility] at weeks 4, and 12 as well as 12 weeks after completion of treatment.   Staci Righter, MD  [facility] [address] [place], [place]  [address] [phone]

## 2014-10-12 ENCOUNTER — Telehealth: Payer: Self-pay | Admitting: *Deleted

## 2014-10-12 ENCOUNTER — Ambulatory Visit: Payer: Medicaid Other | Admitting: *Deleted

## 2014-10-12 DIAGNOSIS — F1910 Other psychoactive substance abuse, uncomplicated: Secondary | ICD-10-CM

## 2014-10-12 NOTE — Telephone Encounter (Signed)
-----   Message from Lora PaulaJosalyn C Funches, MD sent at 10/10/2014  9:11 AM EST ----- Normal TSH

## 2014-10-12 NOTE — Telephone Encounter (Signed)
Pt aware of lab results 

## 2014-10-17 ENCOUNTER — Ambulatory Visit: Payer: Medicaid Other | Admitting: *Deleted

## 2014-10-24 ENCOUNTER — Ambulatory Visit: Payer: Medicaid Other

## 2014-10-25 ENCOUNTER — Ambulatory Visit: Payer: Medicaid Other | Admitting: *Deleted

## 2014-10-25 DIAGNOSIS — F1010 Alcohol abuse, uncomplicated: Secondary | ICD-10-CM

## 2014-10-26 NOTE — BH Specialist Note (Signed)
Mary Garrison was present today for the first time for her scheduled visit.  Client has not showed for two other appointments that were scheduled.  Counselor observed client's grandmother with her and some times answering for her.  Client was oriented times four with good affect and dress.  Client was alert but not very talkative.  Client loosened up as the session proceeded forward.  Client indicated that she was not using any drugs right now but did drink a couple of drinks on the weekends.  Client shared that she does not drink around her son.  Client stated that she was drinking about a 1/5 of hard liquor a day up until a couple of weeks ago.  Client spoke of having to deal with a lot of anxiety. Client shared that she is on medication for anxiety and depression and they seem to be working pretty good.  Client stated that if she was not taking any medications she would not be abel to deal with anything.  Client shared that she takes .25 mg of Xanax a day to combat her anxiety levels. Client's grand mother admitted that the environment that client grew up in was not conducive for any child to grow up in.Client admitted that she is not in the best place to be not using drugs.  Client says that her mother who lives in the same household has a serious drug problem as well and is not receiving any help.  As a matter of fact, client reports that her mom is in denial. Client communicated that she just wants to focus on getting healthier with her Hepatitis diagnosis and raise her son accordingly. Counselor provided support and encouragement for client today.  Counselor suggested client come back for additional session to get to the root of her anxiety and learn new coping skills. Counselor agreed and made another appointment.   Jenel LucksJodi Aylana Hirschfeld, LPCA, MA [facility]

## 2014-11-01 ENCOUNTER — Ambulatory Visit: Payer: Medicaid Other | Admitting: *Deleted

## 2014-11-01 DIAGNOSIS — F1010 Alcohol abuse, uncomplicated: Secondary | ICD-10-CM

## 2014-11-01 NOTE — BH Specialist Note (Signed)
Mary Garrison was present for her scheduled appointment with counselor today.  Client was oriented times four with good affect and dress.  Client was alert and talkative.  Client's grandmother was not present today. Client shared that things were going ok but was disappointed that she had not been offered a job yet but indicated that she was still looking. Client shared that she felt like her health was getting better and was gaining some weight.  Client reports no drug/alcohol use in the last week and half.  Client talked about her living situation in which her grandmother, her son, her mother, her mother's boyfriend, and her younger brother all live in the same 3 bedroom apartment.  Client stated that she has to share a room with her grand mother and her son and it was not a good setting.  Client stated that she really wants to find work and have some extra money to buy a storage unit to accomodate her things and her son's things. Otherwise client felt that things were ok in her life and she likes it to be uneventful so she does not experience a lot of anxiety and worry which then makes her want to drink. Counselor provided support and encouragement accordingly.   Mary Garrison, LPCA, MA [facility]

## 2014-11-02 ENCOUNTER — Telehealth: Payer: Self-pay | Admitting: Family Medicine

## 2014-11-02 DIAGNOSIS — Z01419 Encounter for gynecological examination (general) (routine) without abnormal findings: Secondary | ICD-10-CM

## 2014-11-02 NOTE — Telephone Encounter (Signed)
[facility], last office visit was on 10/09/14. Please f/u with pt.

## 2014-11-03 DIAGNOSIS — Z01419 Encounter for gynecological examination (general) (routine) without abnormal findings: Secondary | ICD-10-CM | POA: Insufficient documentation

## 2014-11-03 NOTE — Telephone Encounter (Signed)
Referral placed. Please inform patient.

## 2014-11-03 NOTE — Addendum Note (Signed)
Addended by: Dessa PhiFUNCHES, Maylynn Orzechowski on: 11/03/2014 02:23 PM   Modules accepted: Orders

## 2014-11-15 ENCOUNTER — Ambulatory Visit: Payer: Medicaid Other

## 2014-11-21 ENCOUNTER — Ambulatory Visit: Payer: Medicaid Other | Admitting: *Deleted

## 2014-11-21 DIAGNOSIS — F1010 Alcohol abuse, uncomplicated: Secondary | ICD-10-CM

## 2014-11-21 NOTE — BH Specialist Note (Signed)
Mary Garrison was present for her scheduled appointment today with counselor.  Client was oriented times four with good affect and dress.  Client was alert and talkative.  Client shared that she drank over the weekend pretty heavily.  Client stated that she just became overwhelmed and felt like she needed to escape and so she drank.  Counselor educated client on justification as it relates to recovery.  Counselor shared with client that relapse justifications may appear harmless because this is what the addicted brain tells you.  Client responded well and was attentive.  Counselor further discussed with client the automatic response process.  Counselor indicated that client needs to understand and anticipate situations in which she will be vulnerable to relapse.  Client identified two examples: relationship with her mother and her son's father.  Counselor shared with client that by knowing your weaknesses in advance with halt the automatic process that leads from event to justification relapse.  Client provided good feedback and shared clearly and openly. Counselor encouraged client to meet weekly for now and we can taper off later when she has some clean time under her belt.  Client agreed and made another appointment. Counselor provided support and encouragement.   Jenel LucksJodi Darchelle Nunes, LPCA, MA [facility]

## 2014-11-28 ENCOUNTER — Ambulatory Visit: Payer: Medicaid Other | Admitting: *Deleted

## 2014-11-29 ENCOUNTER — Ambulatory Visit: Payer: Medicaid Other | Admitting: *Deleted

## 2014-11-29 DIAGNOSIS — F1010 Alcohol abuse, uncomplicated: Secondary | ICD-10-CM

## 2014-11-29 NOTE — BH Specialist Note (Signed)
Mary Garrison was present for her scheduled appointment today with counselor.  Client was oriented times four with good affect and dress.  Client was alert and talkative.  Client shared that she had had a rough week since meeting together last as a result of battling custody issues with her "ex' over their son [name].  Client reported that she has not used any substances since last appointment a week ago.  Client communicated that she is stressed out and is not eating properly because she has no appetite at this time.  Counselor educated client on early challenges of recovery especially irritability. Counselor explained to client that small events can create feelings of anger that seem to preoccupy your thoughts and can lead to relapse. Counselor encouraged client to remind herself that recovery involves a healing of brain chemistry and that strong unpredictable emotions are a natural part of recovery. Counselor gave client props for recognizing she was not feeling well and took the initiative to come in and talk it over with a support person instead of ineffectively coping by using substances. Counselor reviewed other alternative ways for handling these situations such as: take a walk, avoid the people who upset you, pick your battles, continue building a positive support network.  Counselor also talked to client about boredom and how it can affect the recovery process.  Counselor encouraged client to find productive things to do and participate in during her recovery. Client stated that she wanted to meet weekly right now as she felt she had no one else to talk to who would be objective and who did not use substances.  Jenel LucksJodi Zykira Matlack, LPCA, MA [facility]

## 2014-11-30 ENCOUNTER — Ambulatory Visit: Payer: Medicaid Other | Admitting: *Deleted

## 2014-12-06 ENCOUNTER — Emergency Department (HOSPITAL_COMMUNITY)
Admission: EM | Admit: 2014-12-06 | Discharge: 2014-12-06 | Disposition: A | Discharge status: Home / Self Care | Payer: Medicaid Other | Attending: Emergency Medicine | Admitting: Emergency Medicine

## 2014-12-06 ENCOUNTER — Encounter (HOSPITAL_COMMUNITY): Payer: Self-pay | Admitting: Emergency Medicine

## 2014-12-06 ENCOUNTER — Ambulatory Visit: Payer: Medicaid Other | Admitting: *Deleted

## 2014-12-06 DIAGNOSIS — S0010XA Contusion of unspecified eyelid and periocular area, initial encounter: Secondary | ICD-10-CM

## 2014-12-06 DIAGNOSIS — Y9289 Other specified places as the place of occurrence of the external cause: Secondary | ICD-10-CM | POA: Diagnosis not present

## 2014-12-06 DIAGNOSIS — Y998 Other external cause status: Secondary | ICD-10-CM | POA: Diagnosis not present

## 2014-12-06 DIAGNOSIS — Z8639 Personal history of other endocrine, nutritional and metabolic disease: Secondary | ICD-10-CM | POA: Insufficient documentation

## 2014-12-06 DIAGNOSIS — Y9389 Activity, other specified: Secondary | ICD-10-CM | POA: Insufficient documentation

## 2014-12-06 DIAGNOSIS — Z8619 Personal history of other infectious and parasitic diseases: Secondary | ICD-10-CM | POA: Diagnosis not present

## 2014-12-06 DIAGNOSIS — S0993XA Unspecified injury of face, initial encounter: Secondary | ICD-10-CM | POA: Diagnosis present

## 2014-12-06 DIAGNOSIS — S0592XA Unspecified injury of left eye and orbit, initial encounter: Secondary | ICD-10-CM | POA: Diagnosis not present

## 2014-12-06 DIAGNOSIS — Z79899 Other long term (current) drug therapy: Secondary | ICD-10-CM | POA: Insufficient documentation

## 2014-12-06 DIAGNOSIS — J45909 Unspecified asthma, uncomplicated: Secondary | ICD-10-CM | POA: Diagnosis not present

## 2014-12-06 DIAGNOSIS — Z880 Allergy status to penicillin: Secondary | ICD-10-CM | POA: Insufficient documentation

## 2014-12-06 DIAGNOSIS — Z720 Tobacco use: Secondary | ICD-10-CM | POA: Diagnosis not present

## 2014-12-06 DIAGNOSIS — W500XXA Accidental hit or strike by another person, initial encounter: Secondary | ICD-10-CM | POA: Insufficient documentation

## 2014-12-06 DIAGNOSIS — F1010 Alcohol abuse, uncomplicated: Secondary | ICD-10-CM

## 2014-12-06 HISTORY — DX: Tachycardia, unspecified: R00.0
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
## 2014-12-06 NOTE — Discharge Instructions (Signed)
Cryotherapy °Cryotherapy means treatment with cold. Ice or gel packs can be used to reduce both pain and swelling. Ice is the most helpful within the first 24 to 48 hours after an injury or flare-up from overusing a muscle or joint. Sprains, strains, spasms, burning pain, shooting pain, and aches can all be eased with ice. Ice can also be used when recovering from surgery. Ice is effective, has very few side effects, and is safe for most people to use. °PRECAUTIONS  °Ice is not a safe treatment option for people with: °· Raynaud phenomenon. This is a condition affecting small blood vessels in the extremities. Exposure to cold may cause your problems to return. °· Cold hypersensitivity. There are many forms of cold hypersensitivity, including: °¨ Cold urticaria. Red, itchy hives appear on the skin when the tissues begin to warm after being iced. °¨ Cold erythema. This is a red, itchy rash caused by exposure to cold. °¨ Cold hemoglobinuria. Red blood cells break down when the tissues begin to warm after being iced. The hemoglobin that carry oxygen are passed into the urine because they cannot combine with blood proteins fast enough. °· Numbness or altered sensitivity in the area being iced. °If you have any of the following conditions, do not use ice until you have discussed cryotherapy with your caregiver: °· Heart conditions, such as arrhythmia, angina, or chronic heart disease. °· High blood pressure. °· Healing wounds or open skin in the area being iced. °· Current infections. °· Rheumatoid arthritis. °· Poor circulation. °· Diabetes. °Ice slows the blood flow in the region it is applied. This is beneficial when trying to stop inflamed tissues from spreading irritating chemicals to surrounding tissues. However, if you expose your skin to cold temperatures for too long or without the proper protection, you can damage your skin or nerves. Watch for signs of skin damage due to cold. °HOME CARE INSTRUCTIONS °Follow  these tips to use ice and cold packs safely. °· Place a dry or damp towel between the ice and skin. A damp towel will cool the skin more quickly, so you may need to shorten the time that the ice is used. °· For a more rapid response, add gentle compression to the ice. °· Ice for no more than 10 to 20 minutes at a time. The bonier the area you are icing, the less time it will take to get the benefits of ice. °· Check your skin after 5 minutes to make sure there are no signs of a poor response to cold or skin damage. °· Rest 20 minutes or more between uses. °· Once your skin is numb, you can end your treatment. You can test numbness by very lightly touching your skin. The touch should be so light that you do not see the skin dimple from the pressure of your fingertip. When using ice, most people will feel these normal sensations in this order: cold, burning, aching, and numbness. °· Do not use ice on someone who cannot communicate their responses to pain, such as small children or people with dementia. °HOW TO MAKE AN ICE PACK °Ice packs are the most common way to use ice therapy. Other methods include ice massage, ice baths, and cryosprays. Muscle creams that cause a cold, tingly feeling do not offer the same benefits that ice offers and should not be used as a substitute unless recommended by your caregiver. °To make an ice pack, do one of the following: °· Place crushed ice or a   bag of frozen vegetables in a sealable plastic bag. Squeeze out the excess air. Place this bag inside another plastic bag. Slide the bag into a pillowcase or place a damp towel between your skin and the bag.  Mix 3 parts water with 1 part rubbing alcohol. Freeze the mixture in a sealable plastic bag. When you remove the mixture from the freezer, it will be slushy. Squeeze out the excess air. Place this bag inside another plastic bag. Slide the bag into a pillowcase or place a damp towel between your skin and the bag. SEEK MEDICAL CARE  IF:  You develop white spots on your skin. This may give the skin a blotchy (mottled) appearance.  Your skin turns blue or pale.  Your skin becomes waxy or hard.  Your swelling gets worse. MAKE SURE YOU:   Understand these instructions.  Will watch your condition.  Will get help right away if you are not doing well or get worse. Document Released: 03/31/2011 Document Revised: 12/19/2013 Document Reviewed: 03/31/2011 [facility] Patient Information 2015 [employer] [place]. This information is not intended to replace advice given to you by your health care provider. Make sure you discuss any questions you have with your health care provider.  You may continue to take ibuprofen at home, 800 mg every 6 hours as needed for discomfort. You may also use cold compresses to help with any swelling. It is also important to follow up with primary care for further evaluation and management of these symptoms. Return to ED for new or worsening symptoms.
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
## 2014-12-08 ENCOUNTER — Encounter: Payer: Self-pay | Admitting: Family Medicine

## 2014-12-08 ENCOUNTER — Ambulatory Visit: Payer: Medicaid Other | Attending: Family Medicine | Admitting: Family Medicine

## 2014-12-08 VITALS — BP 106/69 | HR 78 | Temp 98.3°F | Resp 16 | Ht 65.0 in | Wt 110.0 lb

## 2014-12-08 DIAGNOSIS — S0511XS Contusion of eyeball and orbital tissues, right eye, sequela: Secondary | ICD-10-CM

## 2014-12-08 DIAGNOSIS — S0511XA Contusion of eyeball and orbital tissues, right eye, initial encounter: Secondary | ICD-10-CM | POA: Insufficient documentation

## 2014-12-08 DIAGNOSIS — S0512XA Contusion of eyeball and orbital tissues, left eye, initial encounter: Secondary | ICD-10-CM | POA: Diagnosis not present

## 2014-12-08 DIAGNOSIS — R634 Abnormal weight loss: Secondary | ICD-10-CM

## 2014-12-08 DIAGNOSIS — W500XXA Accidental hit or strike by another person, initial encounter: Secondary | ICD-10-CM | POA: Diagnosis not present

## 2014-12-08 DIAGNOSIS — S0510XA Contusion of eyeball and orbital tissues, unspecified eye, initial encounter: Secondary | ICD-10-CM | POA: Insufficient documentation

## 2014-12-08 DIAGNOSIS — F418 Other specified anxiety disorders: Secondary | ICD-10-CM | POA: Insufficient documentation

## 2014-12-08 DIAGNOSIS — B1920 Unspecified viral hepatitis C without hepatic coma: Secondary | ICD-10-CM | POA: Diagnosis not present

## 2014-12-08 MED ORDER — ALPRAZOLAM 0.5 MG PO TABS
0.5000 mg | ORAL_TABLET | Freq: Two times a day (BID) | ORAL | Status: DC | PRN
Start: 2014-12-08 — End: 2014-12-27

## 2014-12-08 MED ORDER — TRAMADOL HCL 50 MG PO TABS: 50.0000 mg | ORAL_TABLET | Freq: Two times a day (BID) | ORAL | Status: DC | PRN
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
## 2014-12-08 NOTE — Assessment & Plan Note (Signed)
Anxiety: Refilled xanax [facility] and [facility].  The management of your anxiety medication should be taken over my mental health.
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
## 2014-12-08 NOTE — Addendum Note (Signed)
Addended by: Dessa PhiFUNCHES, Brysten Reister on: 12/08/2014 05:08 PM   Modules accepted: Orders

## 2014-12-13 ENCOUNTER — Telehealth: Payer: Self-pay | Admitting: *Deleted

## 2014-12-13 ENCOUNTER — Ambulatory Visit: Payer: Medicaid Other | Admitting: *Deleted

## 2014-12-13 DIAGNOSIS — F1010 Alcohol abuse, uncomplicated: Secondary | ICD-10-CM

## 2014-12-13 DIAGNOSIS — F1210 Cannabis abuse, uncomplicated: Secondary | ICD-10-CM

## 2014-12-13 NOTE — Telephone Encounter (Signed)
Comments in routing message.

## 2014-12-13 NOTE — Telephone Encounter (Deleted)
Routing Comments
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
## 2014-12-15 ENCOUNTER — Telehealth: Payer: Self-pay | Admitting: Family Medicine

## 2014-12-15 ENCOUNTER — Ambulatory Visit (HOSPITAL_COMMUNITY)
Admission: RE | Admit: 2014-12-15 | Discharge: 2014-12-15 | Disposition: A | Discharge status: Home / Self Care | Payer: Medicaid Other | Source: Ambulatory Visit | Attending: Family Medicine | Admitting: Family Medicine

## 2014-12-15 ENCOUNTER — Telehealth: Payer: Self-pay | Admitting: *Deleted

## 2014-12-15 DIAGNOSIS — W228XXA Striking against or struck by other objects, initial encounter: Secondary | ICD-10-CM | POA: Insufficient documentation

## 2014-12-15 DIAGNOSIS — S0511XA Contusion of eyeball and orbital tissues, right eye, initial encounter: Secondary | ICD-10-CM | POA: Insufficient documentation

## 2014-12-15 DIAGNOSIS — S0511XS Contusion of eyeball and orbital tissues, right eye, sequela: Secondary | ICD-10-CM

## 2014-12-15 NOTE — Telephone Encounter (Signed)
Pt returning call for nurse

## 2014-12-15 NOTE — Telephone Encounter (Signed)
-----   Message from Dessa PhiJosalyn Funches, MD sent at 12/15/2014  4:38 PM EDT ----- CT orbits negative for facial fracture

## 2014-12-15 NOTE — Telephone Encounter (Signed)
Please call patient CT orbits denied by [employer]. How is pain and swelling and vision in R eye. If improving CT not needed. If no better or worse, CT needed and will start appeal process.

## 2014-12-15 NOTE — Telephone Encounter (Signed)
LVM to return call.

## 2014-12-18 ENCOUNTER — Observation Stay (HOSPITAL_COMMUNITY)
Admission: EM | Admit: 2014-12-18 | Discharge: 2014-12-19 | Disposition: A | Discharge status: Home / Self Care | Payer: Medicaid Other | Attending: Internal Medicine | Admitting: Internal Medicine

## 2014-12-18 DIAGNOSIS — F17210 Nicotine dependence, cigarettes, uncomplicated: Secondary | ICD-10-CM | POA: Diagnosis not present

## 2014-12-18 DIAGNOSIS — B182 Chronic viral hepatitis C: Secondary | ICD-10-CM

## 2014-12-18 DIAGNOSIS — D649 Anemia, unspecified: Secondary | ICD-10-CM

## 2014-12-18 DIAGNOSIS — K7460 Unspecified cirrhosis of liver: Secondary | ICD-10-CM | POA: Diagnosis not present

## 2014-12-18 DIAGNOSIS — F1010 Alcohol abuse, uncomplicated: Secondary | ICD-10-CM | POA: Diagnosis not present

## 2014-12-18 DIAGNOSIS — J45909 Unspecified asthma, uncomplicated: Secondary | ICD-10-CM | POA: Diagnosis not present

## 2014-12-18 DIAGNOSIS — D61818 Other pancytopenia: Secondary | ICD-10-CM | POA: Diagnosis not present

## 2014-12-18 DIAGNOSIS — R000 Tachycardia, unspecified: Secondary | ICD-10-CM

## 2014-12-18 DIAGNOSIS — Z720 Tobacco use: Secondary | ICD-10-CM | POA: Diagnosis present

## 2014-12-18 DIAGNOSIS — Z79899 Other long term (current) drug therapy: Secondary | ICD-10-CM | POA: Insufficient documentation

## 2014-12-18 DIAGNOSIS — E876 Hypokalemia: Secondary | ICD-10-CM | POA: Diagnosis not present

## 2014-12-18 DIAGNOSIS — F1011 Alcohol abuse, in remission: Secondary | ICD-10-CM | POA: Diagnosis present

## 2014-12-18 DIAGNOSIS — F419 Anxiety disorder, unspecified: Secondary | ICD-10-CM

## 2014-12-18 DIAGNOSIS — Z8619 Personal history of other infectious and parasitic diseases: Secondary | ICD-10-CM | POA: Diagnosis present

## 2014-12-18 LAB — I-STAT CG4 LACTIC ACID, ED
LACTIC ACID, VENOUS: 6.89 mmol/L — AB (ref 0.5–2.0)
LACTIC ACID, VENOUS: 0.99 mmol/L (ref 0.5–2.0)

## 2014-12-18 LAB — CBC WITH DIFFERENTIAL/PLATELET
Basophils Absolute: 0.1 10*3/uL (ref 0.0–0.1)
Basophils Relative: 1 % (ref 0–1)
Eosinophils Absolute: 0 10*3/uL (ref 0.0–0.7)
Eosinophils Relative: 0 % (ref 0–5)
HCT: 33.7 % — ABNORMAL LOW (ref 36.0–46.0)
Hemoglobin: 11.7 g/dL — ABNORMAL LOW (ref 12.0–15.0)
LYMPHS ABS: 2 10*3/uL (ref 0.7–4.0)
Lymphocytes Relative: 28 % (ref 12–46)
MCH: 32.8 pg (ref 26.0–34.0)
MCHC: 34.7 g/dL (ref 30.0–36.0)
MCV: 94.4 fL (ref 78.0–100.0)
Monocytes Absolute: 0.8 10*3/uL (ref 0.1–1.0)
Monocytes Relative: 11 % (ref 3–12)
NEUTROS ABS: 4.3 10*3/uL (ref 1.7–7.7)
Neutrophils Relative %: 60 % (ref 43–77)
PLATELETS: 73 10*3/uL — AB (ref 150–400)
RBC: 3.57 MIL/uL — ABNORMAL LOW (ref 3.87–5.11)
RDW: 19.2 % — AB (ref 11.5–15.5)
WBC: 7.2 10*3/uL (ref 4.0–10.5)

## 2014-12-18 LAB — BASIC METABOLIC PANEL
Anion gap: 25 — ABNORMAL HIGH (ref 5–15)
BUN: 7 mg/dL (ref 6–20)
CO2: 22 mmol/L (ref 22–32)
Calcium: 7.7 mg/dL — ABNORMAL LOW (ref 8.9–10.3)
Chloride: 87 mmol/L — ABNORMAL LOW (ref 101–111)
Creatinine, Ser: 1.18 mg/dL — ABNORMAL HIGH (ref 0.44–1.00)
GFR calc Af Amer: >60 mL/min (ref >60)
GFR calc non Af Amer: >60 mL/min (ref >60)
GLUCOSE: 97 mg/dL (ref 70–99)
Potassium: 2.9 mmol/L — ABNORMAL LOW (ref 3.5–5.1)
SODIUM: 134 mmol/L — AB (ref 135–145)

## 2014-12-18 LAB — ETHANOL: Alcohol, Ethyl (B): <5 mg/dL (ref <5)

## 2014-12-18 LAB — MAGNESIUM: Magnesium: 0.7 mg/dL — CL (ref 1.7–2.4)

## 2014-12-18 LAB — PROTIME-INR
INR: 1.23 (ref 0.00–1.49)
PROTHROMBIN TIME: 15.6 s — AB (ref 11.6–15.2)

## 2014-12-18 LAB — POC URINE PREG, ED: PREG TEST UR: NEGATIVE

## 2014-12-18 LAB — CBG MONITORING, ED: GLUCOSE-CAPILLARY: 95 mg/dL (ref 70–99)

## 2014-12-18 MED ORDER — ONDANSETRON HCL 4 MG/2ML IJ SOLN
4.0000 mg | Freq: Once | INTRAMUSCULAR | Status: AC
Start: 2014-12-18 — End: 2014-12-18
  Administered 2014-12-18: 4 mg via INTRAVENOUS
  Filled 2014-12-18: qty 2

## 2014-12-18 MED ORDER — MAGNESIUM SULFATE 2 GM/50ML IV SOLN
2.0000 g | Freq: Once | INTRAVENOUS | Status: AC
  Administered 2014-12-18: 2 g via INTRAVENOUS
  Filled 2014-12-18: qty 50

## 2014-12-18 MED ORDER — SODIUM CHLORIDE 0.9 % IV BOLUS (SEPSIS)
1000.0000 mL | Freq: Once | INTRAVENOUS | Status: AC
  Administered 2014-12-18: 1000 mL via INTRAVENOUS

## 2014-12-18 MED ORDER — LORAZEPAM 2 MG/ML IJ SOLN
1.0000 mg | Freq: Once | INTRAMUSCULAR | Status: AC
  Administered 2014-12-18: 1 mg via INTRAVENOUS

## 2014-12-18 MED ORDER — POTASSIUM CHLORIDE 10 MEQ/100ML IV SOLN
10.0000 meq | INTRAVENOUS | Status: AC
  Administered 2014-12-18 (×3): 10 meq via INTRAVENOUS
  Filled 2014-12-18 (×2): qty 100

## 2014-12-18 MED ORDER — MORPHINE SULFATE 4 MG/ML IJ SOLN
4.0000 mg | Freq: Once | INTRAMUSCULAR | Status: AC
  Administered 2014-12-18: 4 mg via INTRAVENOUS
  Filled 2014-12-18: qty 1

## 2014-12-18 MED ORDER — LORAZEPAM 2 MG/ML IJ SOLN
INTRAMUSCULAR | Status: AC
  Filled 2014-12-18: qty 1
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
## 2014-12-18 NOTE — ED Notes (Signed)
Able to open left hand but right hand still remains slightly contracted.
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
## 2014-12-18 NOTE — Telephone Encounter (Signed)
LVM to return call x2    Dyann KiefStella M Shadiamond Koska, RMA at 12/15/2014 5:09 PM    Status: Signed      Expand All Collapse All    ----- Message from Dessa PhiJosalyn Funches, MD sent at 12/15/2014 4:38 PM EDT ----- CT orbits negative for facial fracture

## 2014-12-18 NOTE — ED Notes (Signed)
CBG-95. Notified RN 

## 2014-12-18 NOTE — ED Notes (Signed)
Patient presents from triage with HR in the 190's per charge.  Upon arrival patient was alert with hands cramping

## 2014-12-19 ENCOUNTER — Encounter (HOSPITAL_COMMUNITY): Payer: Self-pay | Admitting: *Deleted

## 2014-12-19 DIAGNOSIS — F1010 Alcohol abuse, uncomplicated: Secondary | ICD-10-CM | POA: Diagnosis not present

## 2014-12-19 DIAGNOSIS — E876 Hypokalemia: Secondary | ICD-10-CM | POA: Diagnosis not present

## 2014-12-19 DIAGNOSIS — B182 Chronic viral hepatitis C: Secondary | ICD-10-CM | POA: Diagnosis not present

## 2014-12-19 DIAGNOSIS — Z720 Tobacco use: Secondary | ICD-10-CM | POA: Diagnosis not present

## 2014-12-19 LAB — COMPREHENSIVE METABOLIC PANEL
ALK PHOS: 129 U/L — AB (ref 38–126)
ALT: 66 U/L — ABNORMAL HIGH (ref 14–54)
ANION GAP: 15 (ref 5–15)
AST: 298 U/L — ABNORMAL HIGH (ref 15–41)
Albumin: 3.2 g/dL — ABNORMAL LOW (ref 3.5–5.0)
BILIRUBIN TOTAL: 3.4 mg/dL — AB (ref 0.3–1.2)
CO2: 23 mmol/L (ref 22–32)
Calcium: 6.8 mg/dL — ABNORMAL LOW (ref 8.9–10.3)
Chloride: 96 mmol/L — ABNORMAL LOW (ref 101–111)
Creatinine, Ser: 0.7 mg/dL (ref 0.44–1.00)
GFR calc Af Amer: >60 mL/min (ref >60)
GFR calc non Af Amer: >60 mL/min (ref >60)
GLUCOSE: 85 mg/dL (ref 70–99)
Potassium: 3.1 mmol/L — ABNORMAL LOW (ref 3.5–5.1)
Sodium: 134 mmol/L — ABNORMAL LOW (ref 135–145)
Total Protein: 7.1 g/dL (ref 6.5–8.1)

## 2014-12-19 LAB — CBC
HCT: 29.1 % — ABNORMAL LOW (ref 36.0–46.0)
HEMOGLOBIN: 9.7 g/dL — AB (ref 12.0–15.0)
MCH: 31.8 pg (ref 26.0–34.0)
MCHC: 33.3 g/dL (ref 30.0–36.0)
MCV: 95.4 fL (ref 78.0–100.0)
Platelets: 47 10*3/uL — ABNORMAL LOW (ref 150–400)
RBC: 3.05 MIL/uL — ABNORMAL LOW (ref 3.87–5.11)
RDW: 19 % — AB (ref 11.5–15.5)
WBC: 3.8 10*3/uL — ABNORMAL LOW (ref 4.0–10.5)

## 2014-12-19 LAB — BASIC METABOLIC PANEL
Anion gap: 13 (ref 5–15)
BUN: <5 mg/dL — ABNORMAL LOW (ref 6–20)
CHLORIDE: 97 mmol/L — AB (ref 101–111)
CO2: 25 mmol/L (ref 22–32)
Calcium: 7.3 mg/dL — ABNORMAL LOW (ref 8.9–10.3)
Creatinine, Ser: 0.76 mg/dL (ref 0.44–1.00)
GFR calc non Af Amer: >60 mL/min (ref >60)
Glucose, Bld: 89 mg/dL (ref 70–99)
Potassium: 3 mmol/L — ABNORMAL LOW (ref 3.5–5.1)
Sodium: 135 mmol/L (ref 135–145)

## 2014-12-19 LAB — RAPID URINE DRUG SCREEN, HOSP PERFORMED
AMPHETAMINES: NOT DETECTED
BARBITURATES: NOT DETECTED
Benzodiazepines: POSITIVE — AB
Cocaine: NOT DETECTED
OPIATES: POSITIVE — AB
Tetrahydrocannabinol: NOT DETECTED

## 2014-12-19 LAB — GLUCOSE, CAPILLARY
GLUCOSE-CAPILLARY: 117 mg/dL — AB (ref 70–99)
GLUCOSE-CAPILLARY: 81 mg/dL (ref 70–99)

## 2014-12-19 LAB — MAGNESIUM: MAGNESIUM: 1.3 mg/dL — AB (ref 1.7–2.4)

## 2014-12-19 MED ORDER — FOLIC ACID 1 MG PO TABS: 1.0000 mg | ORAL_TABLET | Freq: Every day | ORAL | Status: DC

## 2014-12-19 MED ORDER — VITAMIN D3 25 MCG (1000 UNIT) PO TABS
1000.0000 [IU] | ORAL_TABLET | Freq: Every day | ORAL | Status: DC
  Administered 2014-12-19: 1000 [IU] via ORAL
  Filled 2014-12-19: qty 1

## 2014-12-19 MED ORDER — ADULT MULTIVITAMIN W/MINERALS CH
1.0000 | ORAL_TABLET | Freq: Every day | ORAL | Status: DC
  Administered 2014-12-19: 1 via ORAL
  Filled 2014-12-19: qty 1

## 2014-12-19 MED ORDER — TRAMADOL HCL 50 MG PO TABS: 50.0000 mg | ORAL_TABLET | Freq: Two times a day (BID) | ORAL | Status: DC | PRN

## 2014-12-19 MED ORDER — POTASSIUM CHLORIDE 10 MEQ/100ML IV SOLN
10.0000 meq | INTRAVENOUS | Status: DC
  Administered 2014-12-19: 10 meq via INTRAVENOUS
  Filled 2014-12-19 (×4): qty 100

## 2014-12-19 MED ORDER — VITAMIN B-12 1000 MCG PO TABS
1000.0000 ug | ORAL_TABLET | Freq: Every day | ORAL | Status: DC
  Administered 2014-12-19: 1000 ug via ORAL
  Filled 2014-12-19: qty 1

## 2014-12-19 MED ORDER — THIAMINE HCL 100 MG/ML IJ SOLN
100.0000 mg | Freq: Every day | INTRAMUSCULAR | Status: DC
  Filled 2014-12-19: qty 1

## 2014-12-19 MED ORDER — FOLIC ACID 1 MG PO TABS
1.0000 mg | ORAL_TABLET | Freq: Every day | ORAL | Status: DC
  Administered 2014-12-19: 1 mg via ORAL
  Filled 2014-12-19: qty 1

## 2014-12-19 MED ORDER — POTASSIUM CHLORIDE CRYS ER 20 MEQ PO TBCR
40.0000 meq | EXTENDED_RELEASE_TABLET | ORAL | Status: AC
  Administered 2014-12-19 (×2): 40 meq via ORAL
  Filled 2014-12-19 (×2): qty 2

## 2014-12-19 MED ORDER — ADULT MULTIVITAMIN W/MINERALS CH: 1.0000 | ORAL_TABLET | Freq: Every day | ORAL | Status: DC

## 2014-12-19 MED ORDER — VITAMIN B-1 100 MG PO TABS
100.0000 mg | ORAL_TABLET | Freq: Every day | ORAL | Status: DC
  Administered 2014-12-19: 100 mg via ORAL
  Filled 2014-12-19: qty 1

## 2014-12-19 MED ORDER — THIAMINE HCL 100 MG/ML IJ SOLN
Freq: Once | INTRAVENOUS | Status: AC
  Administered 2014-12-19: 01:00:00 via INTRAVENOUS
  Filled 2014-12-19: qty 1000

## 2014-12-19 MED ORDER — METOPROLOL TARTRATE 12.5 MG HALF TABLET
12.5000 mg | ORAL_TABLET | Freq: Two times a day (BID) | ORAL | Status: DC
  Administered 2014-12-19: 12.5 mg via ORAL
  Filled 2014-12-19 (×2): qty 1

## 2014-12-19 MED ORDER — LORAZEPAM 1 MG PO TABS: 1.0000 mg | ORAL_TABLET | Freq: Four times a day (QID) | ORAL | Status: DC | PRN

## 2014-12-19 MED ORDER — IBUPROFEN 200 MG PO TABS
400.0000 mg | ORAL_TABLET | ORAL | Status: DC | PRN
  Administered 2014-12-19: 400 mg via ORAL
  Filled 2014-12-19: qty 2

## 2014-12-19 MED ORDER — ALBUTEROL SULFATE (2.5 MG/3ML) 0.083% IN NEBU
3.0000 mL | INHALATION_SOLUTION | Freq: Four times a day (QID) | RESPIRATORY_TRACT | Status: DC | PRN
Start: 2014-12-19 — End: 2014-12-19

## 2014-12-19 MED ORDER — VITAMIN B-1 100 MG PO TABS
100.0000 mg | ORAL_TABLET | Freq: Every day | ORAL | Status: DC
Start: 2014-12-19 — End: 2014-12-19

## 2014-12-19 MED ORDER — LORAZEPAM 2 MG/ML IJ SOLN
1.0000 mg | Freq: Four times a day (QID) | INTRAMUSCULAR | Status: DC | PRN
Start: 2014-12-19 — End: 2014-12-19

## 2014-12-19 MED ORDER — POTASSIUM CHLORIDE 10 MEQ/100ML IV SOLN
10.0000 meq | INTRAVENOUS | Status: AC
  Administered 2014-12-19 (×2): 10 meq via INTRAVENOUS
  Filled 2014-12-19 (×3): qty 100

## 2014-12-19 MED ORDER — ESCITALOPRAM OXALATE 20 MG PO TABS
20.0000 mg | ORAL_TABLET | Freq: Every day | ORAL | Status: DC
  Administered 2014-12-19: 20 mg via ORAL
  Filled 2014-12-19: qty 1

## 2014-12-19 MED ORDER — ALPRAZOLAM 0.5 MG PO TABS
0.5000 mg | ORAL_TABLET | Freq: Two times a day (BID) | ORAL | Status: DC | PRN
  Administered 2014-12-19: 0.5 mg via ORAL
  Filled 2014-12-19: qty 1
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
## 2014-12-19 NOTE — Progress Notes (Signed)
UR completed 

## 2014-12-19 NOTE — Progress Notes (Signed)
Pt discharge education and instructions completed with pt at bedside and pt denies any questions. Pt IV and telemetry removed. Pt discharge home with friend to transport her home. Pt offered a wheelchair but she declines and ambulated off unit with belongings, son and friend at side. Arabella MerlesP. Amo Anina Schnake RN.

## 2014-12-21 ENCOUNTER — Ambulatory Visit: Payer: Medicaid Other | Admitting: *Deleted

## 2014-12-21 DIAGNOSIS — F1010 Alcohol abuse, uncomplicated: Secondary | ICD-10-CM

## 2014-12-21 DIAGNOSIS — F1210 Cannabis abuse, uncomplicated: Secondary | ICD-10-CM

## 2014-12-22 ENCOUNTER — Ambulatory Visit: Payer: Medicaid Other | Admitting: Family Medicine
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
## 2014-12-27 ENCOUNTER — Ambulatory Visit: Payer: Medicaid Other | Admitting: Family Medicine

## 2014-12-27 ENCOUNTER — Ambulatory Visit: Payer: Medicaid Other | Attending: Family Medicine | Admitting: Family Medicine

## 2014-12-27 ENCOUNTER — Encounter: Payer: Self-pay | Admitting: Family Medicine

## 2014-12-27 VITALS — BP 121/85 | HR 114 | Temp 98.5°F | Resp 16 | Ht 65.0 in | Wt 109.0 lb

## 2014-12-27 DIAGNOSIS — M791 Myalgia, unspecified site: Secondary | ICD-10-CM

## 2014-12-27 DIAGNOSIS — Z0000 Encounter for general adult medical examination without abnormal findings: Secondary | ICD-10-CM | POA: Diagnosis not present

## 2014-12-27 DIAGNOSIS — E876 Hypokalemia: Secondary | ICD-10-CM | POA: Insufficient documentation

## 2014-12-27 DIAGNOSIS — F1010 Alcohol abuse, uncomplicated: Secondary | ICD-10-CM | POA: Diagnosis not present

## 2014-12-27 DIAGNOSIS — Z01419 Encounter for gynecological examination (general) (routine) without abnormal findings: Secondary | ICD-10-CM

## 2014-12-27 LAB — BASIC METABOLIC PANEL
BUN: 4 mg/dL — ABNORMAL LOW (ref 6–23)
CALCIUM: 8.8 mg/dL (ref 8.4–10.5)
CO2: 28 mEq/L (ref 19–32)
CREATININE: 0.54 mg/dL (ref 0.50–1.10)
Chloride: 94 mEq/L — ABNORMAL LOW (ref 96–112)
GLUCOSE: 110 mg/dL — AB (ref 70–99)
Potassium: 3.3 mEq/L — ABNORMAL LOW (ref 3.5–5.3)
Sodium: 140 mEq/L (ref 135–145)

## 2014-12-27 LAB — MAGNESIUM: MAGNESIUM: 1.7 mg/dL (ref 1.5–2.5)

## 2014-12-27 MED ORDER — ONDANSETRON 8 MG PO TBDP: 8.0000 mg | ORAL_TABLET | Freq: Three times a day (TID) | ORAL | Status: DC | PRN

## 2014-12-27 MED ORDER — DICLOFENAC SODIUM 3 % TD GEL: 1.0000 "application " | TRANSDERMAL | Status: DC | PRN

## 2014-12-27 MED ORDER — CHLORDIAZEPOXIDE HCL 25 MG PO CAPS: ORAL_CAPSULE | ORAL | Status: DC

## 2014-12-27 NOTE — Assessment & Plan Note (Addendum)
A: patient currently drinking, ready to quit. Declines seeking in patient admission for supervision of alcohol. Current CIWA score is 2 for anxiety.  P: D/c xanax Librium taper  zofran as needed for nausea  Close f/u in 2 weeks

## 2014-12-27 NOTE — Assessment & Plan Note (Signed)
A: muscle pain not controlled with tramadol P: add voltaren gel

## 2014-12-27 NOTE — Assessment & Plan Note (Signed)
A: taking supplement P: Check potassium and mag today

## 2014-12-27 NOTE — Patient Instructions (Addendum)
[name],  Thank you for coming in today.  1. Anxiety with ongoing marijuana and alcohol abuse: STOP xanax Librium taper zofran for nausea Support at [facility] and seek outpatient counseling  [facility] and [facility].   Go to ED for vomiting, severe tremor, confusion or severe anxiety   2. Low potassium: recheck today with mag  F/u in 2 weeks for alcohol cessation   Dr. Armen PickupFunches

## 2014-12-27 NOTE — Progress Notes (Signed)
[facility]  Complaining of body ache  Medicine changes
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
## 2014-12-28 ENCOUNTER — Ambulatory Visit: Payer: Medicaid Other | Admitting: *Deleted

## 2014-12-28 ENCOUNTER — Telehealth: Payer: Self-pay | Admitting: *Deleted

## 2014-12-28 NOTE — Telephone Encounter (Signed)
Pharmacy called asking for clarification on patient's Librium Rx.  Please call pharmacy at [phone]

## 2014-12-28 NOTE — Telephone Encounter (Signed)
Patient called requesting status of medication, Patient states pharmacy needs clarification.  Please f/u with patient

## 2014-12-29 NOTE — Telephone Encounter (Signed)
Called patient's pharmacy and patient has already picked up librium and no clarification is needed per pharmacy.  Tobi Bastosnna, please call patient to ask is she is in need for clarification.

## 2015-01-04 ENCOUNTER — Ambulatory Visit: Payer: Medicaid Other | Admitting: *Deleted

## 2015-01-04 DIAGNOSIS — F1010 Alcohol abuse, uncomplicated: Secondary | ICD-10-CM
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
## 2015-01-04 NOTE — Addendum Note (Signed)
Addended by: Dessa PhiFUNCHES, Karter Haire on: 01/04/2015 12:26 PM   Modules accepted: Orders

## 2015-01-10 ENCOUNTER — Telehealth: Payer: Self-pay | Admitting: *Deleted

## 2015-01-10 ENCOUNTER — Ambulatory Visit: Payer: Medicaid Other | Admitting: *Deleted

## 2015-01-10 DIAGNOSIS — F1210 Cannabis abuse, uncomplicated: Secondary | ICD-10-CM

## 2015-01-10 DIAGNOSIS — F1010 Alcohol abuse, uncomplicated: Secondary | ICD-10-CM

## 2015-01-10 DIAGNOSIS — F1410 Cocaine abuse, uncomplicated: Secondary | ICD-10-CM

## 2015-01-10 NOTE — Telephone Encounter (Signed)
Pt aware of results 

## 2015-01-10 NOTE — Telephone Encounter (Signed)
-----   Message from Dessa PhiJosalyn Funches, MD sent at 12/28/2014  8:40 AM EDT ----- Improving potassium, continue supplement.  Improved magnesium, also continue supplement

## 2015-01-11 NOTE — BH Specialist Note (Signed)
Mary Garrison was present for her scheduled appointment today.  Client was oriented times four with good affect and dress.  Client was talkative and shared freely and openly. Client stated that she has her mediation in court tomorrow with her ex over the custody of their son [name].  Client shared that she was not feeling good about it.  Counselor educated client on how stressful situations can lead to substance use in order to cope with what is going on.  Counselor educated client on strategies for  reducingher stress such as: relaxation, environment, focusing on the present, breaking large goals in to smaller ones if necessary etc. Counselor explained that stress can overload peoples equilibrium. As a result,one should minimize the effects that it has on their lives especially where substance abuse is concerned.  Counselor discussed with client that staying abstinent and focusing on recovery while under a lot of stress is very difficult.  Client communicated that she has confirmed her move to [place] to be closer to her sons' father in June after she & her son bthday. 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 Client communicated that she has not drank in two weeks.  Counselor does not feel that client is being truthful and is underreporting. Client shared with counselor that a family  Member has recently accused her of using heroin on social media and she was very upset about it.  Counselor asked client if it was true and client revealed that she used it in the past but that it was the past. Counselor encouraged client to seek outpatient treatment for her multiple substance use.  Client said she would after she moved to [place] and got settled.  Counselor empathized the importance of addressing it now. Client said she was afraid of what it might make her look like to DSS and does not want her son taken away and or given to her ex full time. Counselor provided support and  encouragement accordingly.   Jenel LucksJodi Dwana Garin, LPCA, MA [facility]

## 2015-01-17 ENCOUNTER — Other Ambulatory Visit: Payer: Self-pay | Admitting: Family Medicine

## 2015-01-17 DIAGNOSIS — E876 Hypokalemia: Secondary | ICD-10-CM

## 2015-01-17 MED ORDER — POTASSIUM CHLORIDE CRYS ER 20 MEQ PO TBCR: 20.0000 meq | EXTENDED_RELEASE_TABLET | Freq: Every day | ORAL | Status: DC | PRN

## 2015-01-18 ENCOUNTER — Ambulatory Visit: Payer: Medicaid Other | Admitting: *Deleted

## 2015-01-24 ENCOUNTER — Ambulatory Visit: Payer: Medicaid Other | Admitting: *Deleted

## 2015-01-24 DIAGNOSIS — F1210 Cannabis abuse, uncomplicated: Secondary | ICD-10-CM

## 2015-01-24 DIAGNOSIS — F1010 Alcohol abuse, uncomplicated: Secondary | ICD-10-CM
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
## 2015-01-26 ENCOUNTER — Encounter: Payer: Self-pay | Admitting: Family Medicine

## 2015-02-01 ENCOUNTER — Ambulatory Visit: Payer: Medicaid Other | Admitting: *Deleted

## 2015-02-01 ENCOUNTER — Other Ambulatory Visit: Payer: Medicaid Other

## 2015-02-01 DIAGNOSIS — F1010 Alcohol abuse, uncomplicated: Secondary | ICD-10-CM

## 2015-02-01 NOTE — Addendum Note (Signed)
Addended by: Jennet Maduro D on: 02/01/2015 11:42 AM   Modules accepted: Orders

## 2015-02-01 NOTE — BH Specialist Note (Signed)
Mary Garrison was present for her scheduled appointment with counselor.  Client was oriented times four with good affect and dress.  Client was alert and talkative.  Client communicated that she was feeling really tired from a busy week. Although client communicated that she is tired, she could not explain what she had been doing to make her so tired.  Client appeared a little nervous as evidenced by fidgeting, avoiding answering direct questions about substance abuse, and giving very little eye contact.  Client reported that she has been clean and has not been using any substances.  Client informed counselor that her DSS worker would be calling soon in order to find out how client was doing.  Client asked counselor to tell her everything is fine and that she is doing well.  Counselor gently explained that things were not going fine and that she was supposed to have met with [facility] counselor referred her too for outpatient substance abuse.  Client stated that she was "doing well" and did not need any treatment at this time.  Counselor feels that client is being dishonest and underreporting her substance abuse and so counselor asked client to take a drug screen.  Client said ok but as soon as she was asked to sit in the lobby to wait for lab to call her back to get the test, she took off out the door without saying anything. Client had a hour appointment with counselor and client still had 20 minutes before the end of the hour to get the lab work completed. Client avoided the drug screen due to most likely going to test positive for one or more substances. Counselor does not feel that client is progressing forward.  Client is not being cooperative and is not committed to sobriety at this time. Counselor talked with DSS worker and communicated how client is doing and the fact that she left and did not complete the drug screen.  Another appointment was made for client to return to see counselor.    Mary Garrison, LPCA, MA [facility]

## 2015-02-06 ENCOUNTER — Other Ambulatory Visit: Payer: Medicaid Other

## 2015-02-13 ENCOUNTER — Ambulatory Visit: Payer: Medicaid Other | Attending: Family Medicine | Admitting: Family Medicine

## 2015-02-13 ENCOUNTER — Telehealth: Payer: Self-pay | Admitting: Family Medicine

## 2015-02-13 ENCOUNTER — Ambulatory Visit: Payer: Medicaid Other | Admitting: *Deleted

## 2015-02-13 ENCOUNTER — Encounter: Payer: Self-pay | Admitting: Family Medicine

## 2015-02-13 VITALS — BP 89/54 | HR 83 | Temp 98.4°F | Resp 18 | Ht 65.0 in | Wt 118.0 lb

## 2015-02-13 DIAGNOSIS — R634 Abnormal weight loss: Secondary | ICD-10-CM

## 2015-02-13 DIAGNOSIS — B182 Chronic viral hepatitis C: Secondary | ICD-10-CM

## 2015-02-13 DIAGNOSIS — R188 Other ascites: Secondary | ICD-10-CM | POA: Insufficient documentation

## 2015-02-13 DIAGNOSIS — R109 Unspecified abdominal pain: Secondary | ICD-10-CM | POA: Diagnosis not present

## 2015-02-13 DIAGNOSIS — F1010 Alcohol abuse, uncomplicated: Secondary | ICD-10-CM

## 2015-02-13 LAB — COMPLETE METABOLIC PANEL WITH GFR
ALK PHOS: 161 U/L — AB (ref 39–117)
ALT: 58 U/L — ABNORMAL HIGH (ref 0–35)
AST: 222 U/L — ABNORMAL HIGH (ref 0–37)
Albumin: 2.7 g/dL — ABNORMAL LOW (ref 3.5–5.2)
BILIRUBIN TOTAL: 7.1 mg/dL — AB (ref 0.2–1.2)
BUN: 14 mg/dL (ref 6–23)
CO2: 28 meq/L (ref 19–32)
Calcium: 7.6 mg/dL — ABNORMAL LOW (ref 8.4–10.5)
Chloride: 84 mEq/L — ABNORMAL LOW (ref 96–112)
Creat: 1.38 mg/dL — ABNORMAL HIGH (ref 0.50–1.10)
GFR, Est African American: 61 mL/min
GFR, Est Non African American: 53 mL/min — ABNORMAL LOW
Glucose, Bld: 83 mg/dL (ref 70–99)
Potassium: 3.2 mEq/L — ABNORMAL LOW (ref 3.5–5.3)
SODIUM: 128 meq/L — AB (ref 135–145)
TOTAL PROTEIN: 5.9 g/dL — AB (ref 6.0–8.3)

## 2015-02-13 LAB — CBC
HEMATOCRIT: 30.3 % — AB (ref 36.0–46.0)
HEMOGLOBIN: 10.2 g/dL — AB (ref 12.0–15.0)
MCH: 33 pg (ref 26.0–34.0)
MCHC: 33.7 g/dL (ref 30.0–36.0)
MCV: 98.1 fL (ref 78.0–100.0)
MPV: 10.5 fL (ref 8.6–12.4)
Platelets: 220 10*3/uL (ref 150–400)
RBC: 3.09 MIL/uL — ABNORMAL LOW (ref 3.87–5.11)
RDW: 18 % — ABNORMAL HIGH (ref 11.5–15.5)
WBC: 8.4 10*3/uL (ref 4.0–10.5)

## 2015-02-13 LAB — MAGNESIUM: MAGNESIUM: 1.6 mg/dL (ref 1.5–2.5)

## 2015-02-13 LAB — PHOSPHORUS: PHOSPHORUS: 1.4 mg/dL — AB (ref 2.3–4.6)

## 2015-02-13 LAB — POCT INR: INR: 1.5

## 2015-02-13 MED ORDER — SPIRONOLACTONE 25 MG PO TABS: 25.0000 mg | ORAL_TABLET | Freq: Every day | ORAL | Status: DC

## 2015-02-13 MED ORDER — FUROSEMIDE 40 MG PO TABS: 40.0000 mg | ORAL_TABLET | Freq: Every day | ORAL | Status: DC

## 2015-02-13 MED ORDER — TRAMADOL HCL 50 MG PO TABS: 50.0000 mg | ORAL_TABLET | Freq: Two times a day (BID) | ORAL | Status: DC | PRN

## 2015-02-13 NOTE — Telephone Encounter (Signed)
Done Thank you  Approval #  [id number]  6-28/7-28-16

## 2015-02-13 NOTE — Assessment & Plan Note (Signed)
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

## 2015-02-13 NOTE — BH Specialist Note (Signed)
Mary Garrison was present today for her scheduled appointment.  Client was oriented times four with flat affect.  Client was angry with counselor and would not answer until some time had passed.  Client was resistant to treatment today as evidenced by little eye contact, avoiding any questions, redirecting counselor, and short with answers she did provide.  Client made a lot of excuses to over shadow the underlying problem of drinking excessively. Client did not look healthy today as her abdomen was terribly swollen, she was jaundice, and very thin elsewhere.  Client was moving slow and not saying much of anything unless spoken too and or asked a question.  Client shared that she was just tired. Counselor asked client why she bolted during the last appointment when asked to take a drug screen.  Client indicated that felt she had been lied too and needed to go.  Counselor asked client to explain how she was lied to yet client communicated that she just feels that she was.  Counselor reviewed the job responsibilities of counselor especially if it has to do with harm to herself and or others such as her 921 year old son.  Client said that she understood and would continue to come to her appointments accordingly.  Counselor expressed to client that she does not look well and recommends her to see a doctor soon.  Client stated that [facility] and was getting lab work done and was prescribed a medication for the fluid build up in abdomen and some pain medication. Counselor encouraged client to be careful with the Tramadol as it is an opiate and she has an addictive personality. Client communicated that she only takes it if she has too but that the pain in her abdomen was painful and she needed something to help her get through it.  Counselor inquired with client about outpatient services to which she said she would think about it.  Client made another appointment with counselor.  Counselor does not  feel that client is making much progress at this time.  Jenel LucksJodi Samiyah Stupka, LPCA, MA Alcohol and Drug Services

## 2015-02-13 NOTE — Assessment & Plan Note (Signed)
A: ongoing abuse in setting of Hep C and chronic hepatitis now with ascites concerning for cirrhosis P: Substance abuse treatment patient is already established with counseling services and has been instructed to have substance abuse treatment

## 2015-02-13 NOTE — Progress Notes (Signed)
   Subjective:    Patient ID: Mary Garrison, female    DOB: 1991-01-21, [age] y.o.   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 Still drinking ETOH so has not been started on Harvoni. Last drink 1 week ago. 3 days ago developed abdominal distension. Mild nausea. No emesis. No fever. Has taking lasix in the past. Not currently taking lasix.   2. ETOH abuse: continue to drink. Having a lot of social problems at home with her mom and now with CPS and the care of her son.   Soc Hx: current smoker  Review of Systems  Constitutional: Positive for fatigue. Negative for fever and chills.  Respiratory: Negative for shortness of breath.   Cardiovascular: Negative for chest pain.  Gastrointestinal: Positive for nausea, abdominal pain and abdominal distention. Negative for vomiting, diarrhea, constipation, blood in stool, anal bleeding and rectal pain.  Genitourinary: Negative for dysuria.       Objective:   Physical Exam BP 89/54 mmHg  Pulse 83  Temp(Src) 98.4 F (36.9 C) (Oral)  Resp 18  Ht 5\' 5"  (1.651 m)  Wt 118 lb (53.524 kg)  BMI 19.64 kg/m2  SpO2 96%  Wt Readings from Last 3 Encounters:  02/13/15 118 lb (53.524 kg)  12/27/14 109 lb (49.442 kg)  12/18/14 113 lb 1.5 oz (51.3 kg)  General appearance: alert, cooperative and no distress Lungs: clear to auscultation bilaterally Heart: regular rate and rhythm, S1, S2 normal, no murmur, click, rub or gallop Abdomen: round, mild TTP, no mass, no rebound or guarding  Extremities: extremities normal, atraumatic, no cyanosis or edema  Skin: mildly jaundice Eyes: mild icterus      Assessment & Plan:

## 2015-02-13 NOTE — Assessment & Plan Note (Addendum)
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   

## 2015-02-13 NOTE — Patient Instructions (Addendum)
[name],  Thank you for coming in today  1. 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

## 2015-02-13 NOTE — Progress Notes (Signed)
Complaining of body swelling since Saturday Abdominal pain

## 2015-02-13 NOTE — Telephone Encounter (Signed)
Mary MessierKathy from pre-service center called requesting authorization for ultrasound, Cpt [id number]

## 2015-02-14 LAB — AMMONIA: Ammonia: 85 umol/L — ABNORMAL HIGH (ref 16–53)

## 2015-02-15 ENCOUNTER — Encounter (HOSPITAL_COMMUNITY): Payer: Self-pay | Admitting: *Deleted

## 2015-02-15 ENCOUNTER — Emergency Department (HOSPITAL_COMMUNITY)
Admission: EM | Admit: 2015-02-15 | Discharge: 2015-02-15 | Disposition: A | Discharge status: Home / Self Care | Payer: Medicaid Other | Source: Home / Self Care | Attending: Emergency Medicine | Admitting: Emergency Medicine

## 2015-02-15 DIAGNOSIS — E861 Hypovolemia: Secondary | ICD-10-CM | POA: Diagnosis present

## 2015-02-15 DIAGNOSIS — F17210 Nicotine dependence, cigarettes, uncomplicated: Secondary | ICD-10-CM | POA: Diagnosis present

## 2015-02-15 DIAGNOSIS — R34 Anuria and oliguria: Secondary | ICD-10-CM | POA: Insufficient documentation

## 2015-02-15 DIAGNOSIS — Z720 Tobacco use: Secondary | ICD-10-CM

## 2015-02-15 DIAGNOSIS — Z8619 Personal history of other infectious and parasitic diseases: Secondary | ICD-10-CM

## 2015-02-15 DIAGNOSIS — E876 Hypokalemia: Secondary | ICD-10-CM

## 2015-02-15 DIAGNOSIS — B182 Chronic viral hepatitis C: Secondary | ICD-10-CM | POA: Diagnosis present

## 2015-02-15 DIAGNOSIS — E440 Moderate protein-calorie malnutrition: Secondary | ICD-10-CM | POA: Diagnosis present

## 2015-02-15 DIAGNOSIS — R188 Other ascites: Secondary | ICD-10-CM

## 2015-02-15 DIAGNOSIS — Z79899 Other long term (current) drug therapy: Secondary | ICD-10-CM | POA: Insufficient documentation

## 2015-02-15 DIAGNOSIS — Z880 Allergy status to penicillin: Secondary | ICD-10-CM | POA: Insufficient documentation

## 2015-02-15 DIAGNOSIS — T39395A Adverse effect of other nonsteroidal anti-inflammatory drugs [NSAID], initial encounter: Secondary | ICD-10-CM | POA: Diagnosis present

## 2015-02-15 DIAGNOSIS — R17 Unspecified jaundice: Secondary | ICD-10-CM | POA: Insufficient documentation

## 2015-02-15 DIAGNOSIS — I959 Hypotension, unspecified: Secondary | ICD-10-CM | POA: Diagnosis present

## 2015-02-15 DIAGNOSIS — N170 Acute kidney failure with tubular necrosis: Secondary | ICD-10-CM | POA: Diagnosis present

## 2015-02-15 DIAGNOSIS — J45909 Unspecified asthma, uncomplicated: Secondary | ICD-10-CM

## 2015-02-15 DIAGNOSIS — D638 Anemia in other chronic diseases classified elsewhere: Secondary | ICD-10-CM | POA: Diagnosis present

## 2015-02-15 DIAGNOSIS — E871 Hypo-osmolality and hyponatremia: Secondary | ICD-10-CM | POA: Diagnosis present

## 2015-02-15 DIAGNOSIS — K7031 Alcoholic cirrhosis of liver with ascites: Secondary | ICD-10-CM | POA: Diagnosis present

## 2015-02-15 DIAGNOSIS — B179 Acute viral hepatitis, unspecified: Secondary | ICD-10-CM | POA: Diagnosis present

## 2015-02-15 DIAGNOSIS — E872 Acidosis: Secondary | ICD-10-CM | POA: Diagnosis present

## 2015-02-15 DIAGNOSIS — R748 Abnormal levels of other serum enzymes: Secondary | ICD-10-CM | POA: Insufficient documentation

## 2015-02-15 DIAGNOSIS — K767 Hepatorenal syndrome: Principal | ICD-10-CM | POA: Diagnosis present

## 2015-02-15 LAB — CBC WITH DIFFERENTIAL/PLATELET
Basophils Absolute: 0 10*3/uL (ref 0.0–0.1)
Basophils Relative: 0 % (ref 0–1)
EOS ABS: 0.1 10*3/uL (ref 0.0–0.7)
EOS PCT: 1 % (ref 0–5)
HCT: 28.4 % — ABNORMAL LOW (ref 36.0–46.0)
Hemoglobin: 9.8 g/dL — ABNORMAL LOW (ref 12.0–15.0)
Lymphocytes Relative: 15 % (ref 12–46)
Lymphs Abs: 1.2 10*3/uL (ref 0.7–4.0)
MCH: 33.7 pg (ref 26.0–34.0)
MCHC: 34.5 g/dL (ref 30.0–36.0)
MCV: 97.6 fL (ref 78.0–100.0)
MONO ABS: 0.9 10*3/uL (ref 0.1–1.0)
MONOS PCT: 12 % (ref 3–12)
NEUTROS PCT: 72 % (ref 43–77)
Neutro Abs: 5.5 10*3/uL (ref 1.7–7.7)
Platelets: 175 10*3/uL (ref 150–400)
RBC: 2.91 MIL/uL — AB (ref 3.87–5.11)
RDW: 18.6 % — ABNORMAL HIGH (ref 11.5–15.5)
WBC: 7.6 10*3/uL (ref 4.0–10.5)

## 2015-02-15 LAB — COMPREHENSIVE METABOLIC PANEL
ALBUMIN: 2.2 g/dL — AB (ref 3.5–5.0)
ALK PHOS: 165 U/L — AB (ref 38–126)
ALT: 67 U/L — AB (ref 14–54)
AST: 240 U/L — ABNORMAL HIGH (ref 15–41)
Anion gap: 16 — ABNORMAL HIGH (ref 5–15)
BILIRUBIN TOTAL: 9.5 mg/dL — AB (ref 0.3–1.2)
BUN: 33 mg/dL — ABNORMAL HIGH (ref 6–20)
CO2: 25 mmol/L (ref 22–32)
Calcium: 7 mg/dL — ABNORMAL LOW (ref 8.9–10.3)
Chloride: 79 mmol/L — ABNORMAL LOW (ref 101–111)
Creatinine, Ser: 4.73 mg/dL — ABNORMAL HIGH (ref 0.44–1.00)
GFR, EST AFRICAN AMERICAN: 14 mL/min — AB (ref >60)
GFR, EST NON AFRICAN AMERICAN: 12 mL/min — AB (ref >60)
GLUCOSE: 94 mg/dL (ref 65–99)
POTASSIUM: 3.2 mmol/L — AB (ref 3.5–5.1)
SODIUM: 120 mmol/L — AB (ref 135–145)
TOTAL PROTEIN: 5.9 g/dL — AB (ref 6.5–8.1)

## 2015-02-15 LAB — PROTIME-INR
INR: 1.49 (ref 0.00–1.49)
Prothrombin Time: 18.1 seconds — ABNORMAL HIGH (ref 11.6–15.2)

## 2015-02-15 LAB — AMMONIA: Ammonia: 64 umol/L — ABNORMAL HIGH (ref 9–35)

## 2015-02-15 LAB — LIPASE, BLOOD: Lipase: 21 U/L — ABNORMAL LOW (ref 22–51)

## 2015-02-15 LAB — ETHANOL: Alcohol, Ethyl (B): <5 mg/dL (ref <5)

## 2015-02-15 MED ORDER — SODIUM CHLORIDE 0.9 % IV SOLN: Freq: Once | INTRAVENOUS | Status: DC

## 2015-02-15 NOTE — ED Notes (Signed)
Unsuccessful IV start x 3. MD informed.

## 2015-02-15 NOTE — ED Notes (Signed)
Pt to ED via [facility] c/o abdominal distention and pain with NV. Hx of ascites and hep C; had an episode of binge drinking on 6/22. On the following Saturday pt noticed distention in abdomen with poor urinary output. Also c/o fever since Monday.  [facility], has an US scheduled for tomorrow on abdomen. Pt has also been taking spiralactone and lasix without improvement

## 2015-02-15 NOTE — ED Notes (Signed)
IV attempt x 1; second RN to attempt

## 2015-02-15 NOTE — ED Provider Notes (Addendum)
CSN: [id number]     Arrival date & time 02/15/15  2054 History   First MD Initiated Contact with Patient 02/15/15 2104     Chief Complaint  Patient presents with  . Bloated  . Abdominal Pain     (Consider location/radiation/quality/duration/timing/severity/associated sxs/prior Treatment) Patient is a 24 y.o. female presenting with abdominal pain. The history is provided by the patient.  Abdominal Pain Pain location:  Generalized Pain quality: fullness and pressure   Pain severity:  Mild Onset quality:  Gradual Timing:  Constant Progression:  Unchanged Chronicity:  Chronic Context: alcohol use   Context: not retching   Relieved by:  Nothing Worsened by:  Nothing tried Ineffective treatments:  None tried Associated symptoms: nausea   Associated symptoms: no constipation, no cough, no diarrhea, no fever, no shortness of breath and no vomiting   Risk factors: alcohol abuse   Risk factors comment:  Hepatitis C   Past Medical History  Diagnosis Date  . Hepatitis C 11/16/2013    suspected she contracted it while getting a tatoo. denies IVDU.   Marland Kitchen Asthma     as a child  . Hypophosphatemia 06/29/2014    Phos of 1.4 > 4.0 with supplementation    . Hypokalemia 06/26/2014  . Alcohol addiction   . Polysubstance dependence   . Heroin addiction   . IV drug user   . C. difficile diarrhea 08/24/2014  . Tachycardia    History reviewed. No pertinent past surgical history. Family History  Problem Relation Age of Onset  . Asthma Mother   . Depression Brother   . Other Brother   . Depression Sister   . Other Sister    History  Substance Use Topics  . Smoking status: Current Every Day Smoker -- 0.50 packs/day for 10 years    Types: Cigarettes  . Smokeless tobacco: Never Used  . Alcohol Use: 0.0 oz/week    0 Standard drinks or equivalent per week     Comment: heavy drinking at times-- last drink last night-- no longer drinking every day   OB History    No data available      Review of Systems  Constitutional: Negative for fever.  Respiratory: Negative for cough and shortness of breath.   Gastrointestinal: Positive for nausea, abdominal pain and abdominal distention. Negative for vomiting, diarrhea and constipation.  Genitourinary: Positive for decreased urine volume.  All other systems reviewed and are negative.     Allergies  Penicillins and Sulfa antibiotics  Home Medications   Prior to Admission medications   Medication Sig Start Date End Date Taking? Authorizing Provider  albuterol (PROVENTIL HFA;VENTOLIN HFA) 108 (90 BASE) MCG/ACT inhaler Inhale 2 puffs into the lungs every 6 (six) hours as needed. For shortness of breath 06/21/14  Yes Josalyn Funches, MD  calcium-vitamin D (OSCAL WITH D) 500-200 MG-UNIT per tablet Take 1 tablet by mouth 2 (two) times daily. 07/26/14  Yes Josalyn Funches, MD  cholecalciferol (VITAMIN D) 1000 UNITS tablet Take 1 tablet (1,000 Units total) by mouth daily. 07/26/14  Yes Josalyn Funches, MD  Cyanocobalamin (B-12 PO) Take 1 tablet by mouth daily.   Yes Historical Provider, MD  Diclofenac Sodium 3 % GEL Place 1 application onto the skin as needed (for muscle pain). 12/27/14  Yes Dessa Phi, MD  folic acid (FOLVITE) 1 MG tablet Take 1 tablet (1 mg total) by mouth daily. 06/28/14  Yes Josalyn Funches, MD  furosemide (LASIX) 40 MG tablet Take 1 tablet (40 mg total) by  mouth daily. 02/13/15  Yes Josalyn Funches, MD  ibuprofen (ADVIL,MOTRIN) 200 MG tablet Take 600 mg by mouth every 6 (six) hours as needed for mild pain or moderate pain.    Yes Historical Provider, MD  levonorgestrel-ethinyl estradiol (AVIANE) 0.1-20 MG-MCG tablet Take 1 tablet by mouth daily.   Yes Historical Provider, MD  metoprolol tartrate (LOPRESSOR) 25 MG tablet Take 0.5 tablets (12.5 mg total) by mouth 2 (two) times daily. 08/24/14  Yes Josalyn Funches, MD  Multiple Vitamins-Minerals (MULTIVITAMIN PO) Take 1 tablet by mouth daily.   Yes Historical Provider,  MD  ondansetron (ZOFRAN ODT) 8 MG disintegrating tablet Take 1 tablet (8 mg total) by mouth every 8 (eight) hours as needed for nausea or vomiting. 12/27/14  Yes Josalyn Funches, MD  potassium & sodium phosphates (PHOS-NAK) 280-160-250 MG PACK Take 1 packet by mouth 4 (four) times daily.   Yes Historical Provider, MD  potassium chloride SA (K-DUR,KLOR-CON) 20 MEQ tablet Take 1 tablet (20 mEq total) by mouth daily as needed. 01/17/15  Yes Josalyn Funches, MD  Pyridoxine HCl (B-6 PO) Take 1 tablet by mouth daily.   Yes Historical Provider, MD  ranitidine (ZANTAC) 150 MG tablet Take 1 tablet (150 mg total) by mouth 2 (two) times daily as needed for heartburn. 10/09/14  Yes Dessa PhiJosalyn Funches, MD  spironolactone (ALDACTONE) 25 MG tablet Take 1 tablet (25 mg total) by mouth daily. 02/13/15  Yes Josalyn Funches, MD  Thiamine HCl (B-1 PO) Take 1 tablet by mouth daily.   Yes Historical Provider, MD  traMADol (ULTRAM) 50 MG tablet Take 1 tablet (50 mg total) by mouth every 12 (twelve) hours as needed. Patient taking differently: Take 50 mg by mouth every 12 (twelve) hours as needed for moderate pain or severe pain.  02/13/15  Yes Dessa PhiJosalyn Funches, MD  chlordiazePOXIDE (LIBRIUM) 25 MG capsule Take two tablets (50 mg) three times daily for 3 days, then twice daily for 3 days, then once daily for 3 days, then one tablet (25 mg) once daily for 4 days then STOP Patient not taking: Reported on 02/15/2015 12/27/14   Josalyn Funches, MD   BP 90/49 mmHg  Pulse 85  Temp(Src) 98.5 F (36.9 C) (Oral)  Resp 18  Ht 5\' 5"  (1.651 m)  Wt 115 lb (52.164 kg)  BMI 19.14 kg/m2  SpO2 93% Physical Exam  Constitutional: She appears well-developed and well-nourished.  HENT:  Head: Normocephalic.  Eyes: Pupils are equal, round, and reactive to light.  Neck: Normal range of motion.  Cardiovascular: Normal rate.   Pulmonary/Chest: Effort normal.  Abdominal: Soft. She exhibits distension. There is no tenderness.  Musculoskeletal:  Normal range of motion.  Neurological: She is alert.  Skin: Skin is warm.  Vitals reviewed.   ED Course  Procedures (including critical care time) Labs Review Labs Reviewed  LIPASE, BLOOD - Abnormal; Notable for the following:    Lipase 21 (*)    All other components within normal limits  CBC WITH DIFFERENTIAL/PLATELET - Abnormal; Notable for the following:    RBC 2.91 (*)    Hemoglobin 9.8 (*)    HCT 28.4 (*)    RDW 18.6 (*)    All other components within normal limits  COMPREHENSIVE METABOLIC PANEL - Abnormal; Notable for the following:    Sodium 120 (*)    Potassium 3.2 (*)    Chloride 79 (*)    BUN 33 (*)    Creatinine, Ser 4.73 (*)    Calcium 7.0 (*)  Total Protein 5.9 (*)    Albumin 2.2 (*)    AST 240 (*)    ALT 67 (*)    Alkaline Phosphatase 165 (*)    Total Bilirubin 9.5 (*)    GFR calc non Af Amer 12 (*)    GFR calc Af Amer 14 (*)    Anion gap 16 (*)    All other components within normal limits  AMMONIA - Abnormal; Notable for the following:    Ammonia 64 (*)    All other components within normal limits  PROTIME-INR - Abnormal; Notable for the following:    Prothrombin Time 18.1 (*)    All other components within normal limits  ETHANOL  URINE RAPID DRUG SCREEN, HOSP PERFORMED    Imaging Review No results found.   EKG Interpretation None     issues.  Labs are essentially unchanged except for the increase in her bilirubin from 7.8-9.5.  She is scheduled for an abdominal ultrasound tomorrow as an outpatient.  She has Ultram at her primary care physician has given her for discomfort.  I've encouraged her to continue taking this  MDM   Final diagnoses:  Ascites  Elevated liver enzymes  Hyperbilirubinemia         Earley Favor, NP 02/15/15 2245  Mancel Bale, MD 02/15/15 2352  Earley Favor, NP 02/23/15 4098  Mancel Bale, MD 03/04/15 1510

## 2015-02-15 NOTE — Discharge Instructions (Signed)
Ascites °Ascites is a gathering of fluid in the belly (abdomen). This is most often caused by liver disease. It may also be caused by a number of other less common problems. It causes a ballooning out (distension) of the abdomen. °CAUSES  °Scarring of the liver (cirrhosis) is the most common cause of ascites. Other causes include: °· Infection or inflammation in the abdomen. °· Cancer in the abdomen. °· Heart failure. °· Certain forms of kidney failure (nephritic syndrome). °· Inflammation of the pancreas. °· Clots in the veins of the liver. °SYMPTOMS  °In the early stages of ascites, you may not have any symptoms. The main symptom of ascites is a sense of abdominal bloating. This is due to the presence of fluid. This may also cause an increase in abdominal or waist size. People with this condition can develop swelling in the legs, and men can develop a swollen scrotum. When there is a lot of fluid, it may be hard to breath. Stretching of the abdomen by fluid can be painful. °DIAGNOSIS  °Certain features of your medical history, such as a history of liver disease and of an enlarging abdomen, can suggest the presence of ascites. The diagnosis of ascites can be made on physical exam by your caregiver. An abdominal ultrasound examination can confirm that ascites is present, and estimate the amount of fluid. °Once ascites is confirmed, it is important to determine its cause. Again, a history of one of the conditions listed in "CAUSES" provides a strong clue. A physical exam is important, and blood and X-ray tests may be needed. During a procedure called paracentesis, a sample of fluid is removed from the abdomen. This can determine certain key features about the fluid, such as whether or not infection or cancer is present. Your caregiver will determine if a paracentesis is necessary. They will describe the procedure to you. °PREVENTION  °Ascites is a complication of other conditions. Therefore to prevent ascites, you  must seek treatment for any significant health conditions you have. Once ascites is present, careful attention to fluid and salt intake may help prevent it from getting worse. If you have ascites, you should not drink alcohol. °PROGNOSIS  °The prognosis of ascites depends on the underlying disease. If the disease is reversible, such as with certain infections or with heart failure, then ascites may improve or disappear. When ascites is caused by cirrhosis, then it indicates that the liver disease has worsened, and further evaluation and treatment of the liver disease is needed. If your ascites is caused by cancer, then the success or failure of the cancer treatment will determine whether your ascites will improve or worsen. °RISKS AND COMPLICATIONS  °Ascites is likely to worsen if it is not properly diagnosed and treated. A large amount of ascites can cause pain and difficulty breathing. The main complication, besides worsening, is infection (called spontaneous bacterial peritonitis). This requires prompt treatment. °TREATMENT  °The treatment of ascites depends on its cause. When liver disease is your cause, medical management using water pills (diuretics) and decreasing salt intake is often effective. Ascites due to peritoneal inflammation or malignancy (cancer) alone does not respond to salt restriction and diuretics. Hospitalization is sometimes required. °If the treatment of ascites cannot be managed with medications, a number of other treatments are available. Your caregivers will help you decide which will work best for you. Some of these are: °· Removal of fluid from the abdomen (paracentesis). °· Fluid from the abdomen is passed into a vein (peritoneovenous shunting). °·   Liver transplantation.  Transjugular intrahepatic portosystemic stent shunt. HOME CARE INSTRUCTIONS  It is important to monitor body weight and the intake and output of fluids. Weigh yourself at the same time every day. Record your  weights. Fluid restriction may be necessary. It is also important to know your salt intake. The more salt you take in, the more fluid you will retain. Ninety percent of people with ascites respond to this approach.  Follow any directions for medicines carefully.  Follow up with your caregiver, as directed.  Report any changes in your health, especially any new or worsening symptoms.  If your ascites is from liver disease, avoid alcohol and other substances toxic to the liver. SEEK MEDICAL CARE IF:   Your weight increases more than a few pounds in a few days.  Your abdominal or waist size increases.  You develop swelling in your legs.  You had swelling and it worsens. SEEK IMMEDIATE MEDICAL CARE IF:   You develop a fever.  You develop new abdominal pain.  You develop difficulty breathing.  You develop confusion.  You have bleeding from the mouth, stomach, or rectum. MAKE SURE YOU:   Understand these instructions.  Will watch your condition.  Will get help right away if you are not doing well or get worse. Document Released: 08/04/2005 Document Revised: 10/27/2011 Document Reviewed: 03/05/2007 [facility] Patient Information 2015 [place]. This information is not intended to replace advice given to you by your health care provider. Make sure you discuss any questions you have with your health care provider. Tonight your labs are stable except for a slightly elevated bilirubin  Please keep your appointment for the Ultrasound as scheduled tomorrow

## 2015-02-16 ENCOUNTER — Encounter: Payer: Self-pay | Admitting: Family Medicine

## 2015-02-16 ENCOUNTER — Ambulatory Visit: Payer: Medicaid Other

## 2015-02-16 ENCOUNTER — Ambulatory Visit (HOSPITAL_COMMUNITY)
Admission: RE | Admit: 2015-02-16 | Discharge: 2015-02-16 | Disposition: A | Discharge status: Home / Self Care | Payer: Medicaid Other | Source: Ambulatory Visit | Attending: Family Medicine | Admitting: Family Medicine

## 2015-02-16 ENCOUNTER — Inpatient Hospital Stay (HOSPITAL_COMMUNITY): Payer: Medicaid Other

## 2015-02-16 ENCOUNTER — Encounter (HOSPITAL_COMMUNITY): Payer: Self-pay | Admitting: Neurology

## 2015-02-16 ENCOUNTER — Inpatient Hospital Stay (HOSPITAL_COMMUNITY)
Admission: EM | Admit: 2015-02-16 | Discharge: 2015-02-19 | DRG: 441 | Disposition: A | Discharge status: Home / Self Care | Payer: Medicaid Other | Attending: Internal Medicine | Admitting: Internal Medicine

## 2015-02-16 ENCOUNTER — Emergency Department (HOSPITAL_COMMUNITY): Payer: Medicaid Other

## 2015-02-16 ENCOUNTER — Ambulatory Visit (HOSPITAL_BASED_OUTPATIENT_CLINIC_OR_DEPARTMENT_OTHER): Payer: Medicaid Other | Admitting: Family Medicine

## 2015-02-16 VITALS — BP 76/40 | HR 78 | Temp 98.2°F | Resp 16 | Ht 65.0 in | Wt 124.0 lb

## 2015-02-16 DIAGNOSIS — K7011 Alcoholic hepatitis with ascites: Secondary | ICD-10-CM | POA: Diagnosis not present

## 2015-02-16 DIAGNOSIS — B182 Chronic viral hepatitis C: Secondary | ICD-10-CM

## 2015-02-16 DIAGNOSIS — I959 Hypotension, unspecified: Secondary | ICD-10-CM | POA: Diagnosis present

## 2015-02-16 DIAGNOSIS — F17210 Nicotine dependence, cigarettes, uncomplicated: Secondary | ICD-10-CM | POA: Diagnosis present

## 2015-02-16 DIAGNOSIS — E861 Hypovolemia: Secondary | ICD-10-CM | POA: Diagnosis present

## 2015-02-16 DIAGNOSIS — R188 Other ascites: Secondary | ICD-10-CM

## 2015-02-16 DIAGNOSIS — D638 Anemia in other chronic diseases classified elsewhere: Secondary | ICD-10-CM | POA: Diagnosis present

## 2015-02-16 DIAGNOSIS — N179 Acute kidney failure, unspecified: Secondary | ICD-10-CM | POA: Diagnosis not present

## 2015-02-16 DIAGNOSIS — B179 Acute viral hepatitis, unspecified: Secondary | ICD-10-CM | POA: Diagnosis present

## 2015-02-16 DIAGNOSIS — R161 Splenomegaly, not elsewhere classified: Secondary | ICD-10-CM | POA: Insufficient documentation

## 2015-02-16 DIAGNOSIS — T39395A Adverse effect of other nonsteroidal anti-inflammatory drugs [NSAID], initial encounter: Secondary | ICD-10-CM | POA: Diagnosis present

## 2015-02-16 DIAGNOSIS — Z8719 Personal history of other diseases of the digestive system: Secondary | ICD-10-CM

## 2015-02-16 DIAGNOSIS — E871 Hypo-osmolality and hyponatremia: Secondary | ICD-10-CM

## 2015-02-16 DIAGNOSIS — F1020 Alcohol dependence, uncomplicated: Secondary | ICD-10-CM

## 2015-02-16 DIAGNOSIS — R109 Unspecified abdominal pain: Secondary | ICD-10-CM

## 2015-02-16 DIAGNOSIS — K769 Liver disease, unspecified: Secondary | ICD-10-CM

## 2015-02-16 DIAGNOSIS — E440 Moderate protein-calorie malnutrition: Secondary | ICD-10-CM | POA: Insufficient documentation

## 2015-02-16 DIAGNOSIS — J45909 Unspecified asthma, uncomplicated: Secondary | ICD-10-CM | POA: Diagnosis present

## 2015-02-16 DIAGNOSIS — R140 Abdominal distension (gaseous): Secondary | ICD-10-CM

## 2015-02-16 DIAGNOSIS — E876 Hypokalemia: Secondary | ICD-10-CM | POA: Diagnosis present

## 2015-02-16 DIAGNOSIS — N170 Acute kidney failure with tubular necrosis: Secondary | ICD-10-CM | POA: Diagnosis present

## 2015-02-16 DIAGNOSIS — K7031 Alcoholic cirrhosis of liver with ascites: Secondary | ICD-10-CM

## 2015-02-16 DIAGNOSIS — R0602 Shortness of breath: Secondary | ICD-10-CM | POA: Diagnosis not present

## 2015-02-16 DIAGNOSIS — K767 Hepatorenal syndrome: Secondary | ICD-10-CM

## 2015-02-16 DIAGNOSIS — D649 Anemia, unspecified: Secondary | ICD-10-CM

## 2015-02-16 DIAGNOSIS — E872 Acidosis: Secondary | ICD-10-CM | POA: Diagnosis present

## 2015-02-16 LAB — LIPASE, BLOOD
LIPASE: 20 U/L — AB (ref 22–51)
Lipase: 21 U/L — ABNORMAL LOW (ref 22–51)

## 2015-02-16 LAB — COMPREHENSIVE METABOLIC PANEL
ALBUMIN: 2.1 g/dL — AB (ref 3.5–5.0)
ALT: 69 U/L — ABNORMAL HIGH (ref 14–54)
AST: 230 U/L — ABNORMAL HIGH (ref 15–41)
Alkaline Phosphatase: 164 U/L — ABNORMAL HIGH (ref 38–126)
Anion gap: 15 (ref 5–15)
BUN: 37 mg/dL — ABNORMAL HIGH (ref 6–20)
CHLORIDE: 78 mmol/L — AB (ref 101–111)
CO2: 27 mmol/L (ref 22–32)
Calcium: 6.8 mg/dL — ABNORMAL LOW (ref 8.9–10.3)
Creatinine, Ser: 5.14 mg/dL — ABNORMAL HIGH (ref 0.44–1.00)
GFR calc Af Amer: 12 mL/min — ABNORMAL LOW (ref >60)
GFR calc non Af Amer: 11 mL/min — ABNORMAL LOW (ref >60)
Glucose, Bld: 82 mg/dL (ref 65–99)
POTASSIUM: 3.3 mmol/L — AB (ref 3.5–5.1)
SODIUM: 120 mmol/L — AB (ref 135–145)
TOTAL PROTEIN: 5.8 g/dL — AB (ref 6.5–8.1)
Total Bilirubin: 9.8 mg/dL — ABNORMAL HIGH (ref 0.3–1.2)

## 2015-02-16 LAB — URINALYSIS, ROUTINE W REFLEX MICROSCOPIC
Glucose, UA: NEGATIVE mg/dL
Ketones, ur: 15 mg/dL — AB
Nitrite: NEGATIVE
PH: 5 (ref 5.0–8.0)
PROTEIN: 30 mg/dL — AB
SPECIFIC GRAVITY, URINE: 1.017 (ref 1.005–1.030)
Urobilinogen, UA: 1 mg/dL (ref 0.0–1.0)

## 2015-02-16 LAB — BLOOD GAS, ARTERIAL
ACID-BASE EXCESS: 2 mmol/L (ref 0.0–2.0)
Bicarbonate: 25.8 mEq/L — ABNORMAL HIGH (ref 20.0–24.0)
Drawn by: 246101
FIO2: 0.21 %
O2 Saturation: 89.4 %
PH ART: 7.435 (ref 7.350–7.450)
Patient temperature: 98.6
TCO2: 27 mmol/L (ref 0–100)
pCO2 arterial: 39.1 mmHg (ref 35.0–45.0)
pO2, Arterial: 60.8 mmHg — ABNORMAL LOW (ref 80.0–100.0)

## 2015-02-16 LAB — ACETAMINOPHEN LEVEL

## 2015-02-16 LAB — CBC WITH DIFFERENTIAL/PLATELET
BASOS ABS: 0 10*3/uL (ref 0.0–0.1)
BASOS PCT: 0 % (ref 0–1)
Eosinophils Absolute: 0.1 10*3/uL (ref 0.0–0.7)
Eosinophils Relative: 1 % (ref 0–5)
HEMATOCRIT: 31.3 % — AB (ref 36.0–46.0)
Hemoglobin: 10.9 g/dL — ABNORMAL LOW (ref 12.0–15.0)
LYMPHS PCT: 17 % (ref 12–46)
Lymphs Abs: 1.2 10*3/uL (ref 0.7–4.0)
MCH: 33.6 pg (ref 26.0–34.0)
MCHC: 34.8 g/dL (ref 30.0–36.0)
MCV: 96.6 fL (ref 78.0–100.0)
MONO ABS: 1.1 10*3/uL — AB (ref 0.1–1.0)
Monocytes Relative: 15 % — ABNORMAL HIGH (ref 3–12)
NEUTROS PCT: 67 % (ref 43–77)
Neutro Abs: 5 10*3/uL (ref 1.7–7.7)
PLATELETS: 152 10*3/uL (ref 150–400)
RBC: 3.24 MIL/uL — ABNORMAL LOW (ref 3.87–5.11)
RDW: 18.6 % — ABNORMAL HIGH (ref 11.5–15.5)
WBC: 7.4 10*3/uL (ref 4.0–10.5)

## 2015-02-16 LAB — URINE MICROSCOPIC-ADD ON

## 2015-02-16 LAB — I-STAT BETA HCG BLOOD, ED (MC, WL, AP ONLY): I-stat hCG, quantitative: <5 m[IU]/mL (ref <5)

## 2015-02-16 LAB — TYPE AND SCREEN
ABO/RH(D): O POS
Antibody Screen: NEGATIVE

## 2015-02-16 LAB — LACTIC ACID, PLASMA: Lactic Acid, Venous: 1.2 mmol/L (ref 0.5–2.0)

## 2015-02-16 LAB — PROTIME-INR
INR: 1.44 (ref 0.00–1.49)
Prothrombin Time: 17.6 seconds — ABNORMAL HIGH (ref 11.6–15.2)

## 2015-02-16 LAB — ABO/RH: ABO/RH(D): O POS

## 2015-02-16 LAB — MRSA PCR SCREENING: MRSA by PCR: NEGATIVE

## 2015-02-16 LAB — LACTATE DEHYDROGENASE: LDH: 275 U/L — ABNORMAL HIGH (ref 98–192)

## 2015-02-16 MED ORDER — LORAZEPAM 1 MG PO TABS: 1.0000 mg | ORAL_TABLET | Freq: Four times a day (QID) | ORAL | Status: DC | PRN

## 2015-02-16 MED ORDER — SODIUM CHLORIDE 0.9 % IJ SOLN
3.0000 mL | Freq: Two times a day (BID) | INTRAMUSCULAR | Status: DC
  Administered 2015-02-16 – 2015-02-19 (×2): 3 mL via INTRAVENOUS

## 2015-02-16 MED ORDER — POTASSIUM CHLORIDE CRYS ER 10 MEQ PO TBCR
10.0000 meq | EXTENDED_RELEASE_TABLET | Freq: Once | ORAL | Status: AC
  Administered 2015-02-16: 10 meq via ORAL
  Filled 2015-02-16: qty 1

## 2015-02-16 MED ORDER — IOHEXOL 300 MG/ML  SOLN
25.0000 mL | INTRAMUSCULAR | Status: AC
  Administered 2015-02-17 (×2): 25 mL via ORAL

## 2015-02-16 MED ORDER — THIAMINE HCL 100 MG/ML IJ SOLN
100.0000 mg | Freq: Every day | INTRAMUSCULAR | Status: DC
  Filled 2015-02-16 (×3): qty 1

## 2015-02-16 MED ORDER — ALBUMIN HUMAN 25 % IV SOLN
50.0000 g | Freq: Once | INTRAVENOUS | Status: AC
  Administered 2015-02-16: 50 g via INTRAVENOUS
  Filled 2015-02-16: qty 200

## 2015-02-16 MED ORDER — SODIUM CHLORIDE 0.9 % IV BOLUS (SEPSIS)
500.0000 mL | Freq: Once | INTRAVENOUS | Status: AC
  Administered 2015-02-16: 500 mL via INTRAVENOUS

## 2015-02-16 MED ORDER — MIDODRINE HCL 5 MG PO TABS
7.5000 mg | ORAL_TABLET | Freq: Three times a day (TID) | ORAL | Status: DC
  Administered 2015-02-16 – 2015-02-18 (×6): 7.5 mg via ORAL
  Filled 2015-02-16 (×9): qty 1

## 2015-02-16 MED ORDER — ONDANSETRON HCL 4 MG PO TABS: 4.0000 mg | ORAL_TABLET | Freq: Four times a day (QID) | ORAL | Status: DC | PRN

## 2015-02-16 MED ORDER — ONDANSETRON HCL 4 MG/2ML IJ SOLN
4.0000 mg | Freq: Four times a day (QID) | INTRAMUSCULAR | Status: DC | PRN
  Administered 2015-02-17: 4 mg via INTRAVENOUS
  Filled 2015-02-16: qty 2

## 2015-02-16 MED ORDER — FOLIC ACID 1 MG PO TABS
1.0000 mg | ORAL_TABLET | Freq: Every day | ORAL | Status: DC
  Administered 2015-02-16 – 2015-02-19 (×4): 1 mg via ORAL
  Filled 2015-02-16 (×4): qty 1

## 2015-02-16 MED ORDER — POTASSIUM CHLORIDE 10 MEQ/100ML IV SOLN: 10.0000 meq | INTRAVENOUS | Status: DC

## 2015-02-16 MED ORDER — HEPARIN SODIUM (PORCINE) 5000 UNIT/ML IJ SOLN
5000.0000 [IU] | Freq: Three times a day (TID) | INTRAMUSCULAR | Status: DC
  Administered 2015-02-16 – 2015-02-18 (×5): 5000 [IU] via SUBCUTANEOUS
  Filled 2015-02-16 (×7): qty 1

## 2015-02-16 MED ORDER — SODIUM CHLORIDE 0.9 % IV BOLUS (SEPSIS)
1000.0000 mL | Freq: Once | INTRAVENOUS | Status: AC
  Administered 2015-02-16: 1000 mL via INTRAVENOUS

## 2015-02-16 MED ORDER — PREDNISOLONE 5 MG PO TABS
40.0000 mg | ORAL_TABLET | Freq: Every day | ORAL | Status: DC
  Administered 2015-02-16: 40 mg via ORAL
  Filled 2015-02-16 (×3): qty 8

## 2015-02-16 MED ORDER — OCTREOTIDE ACETATE 100 MCG/ML IJ SOLN
100.0000 ug | Freq: Three times a day (TID) | INTRAMUSCULAR | Status: DC
  Administered 2015-02-16 – 2015-02-17 (×2): 100 ug via SUBCUTANEOUS
  Filled 2015-02-16 (×4): qty 1

## 2015-02-16 MED ORDER — LEVOFLOXACIN IN D5W 750 MG/150ML IV SOLN
750.0000 mg | Freq: Once | INTRAVENOUS | Status: AC
  Administered 2015-02-16: 750 mg via INTRAVENOUS
  Filled 2015-02-16: qty 150

## 2015-02-16 MED ORDER — SODIUM CHLORIDE 0.9 % IV SOLN: INTRAVENOUS | Status: DC

## 2015-02-16 MED ORDER — LEVOFLOXACIN IN D5W 500 MG/100ML IV SOLN: 500.0000 mg | INTRAVENOUS | Status: DC

## 2015-02-16 MED ORDER — VITAMIN B-1 100 MG PO TABS
100.0000 mg | ORAL_TABLET | Freq: Every day | ORAL | Status: DC
Start: 2015-02-16 — End: 2015-02-19
  Administered 2015-02-16 – 2015-02-19 (×4): 100 mg via ORAL
  Filled 2015-02-16 (×4): qty 1

## 2015-02-16 MED ORDER — LORAZEPAM 2 MG/ML IJ SOLN: 1.0000 mg | Freq: Four times a day (QID) | INTRAMUSCULAR | Status: DC | PRN

## 2015-02-16 MED ORDER — ADULT MULTIVITAMIN W/MINERALS CH
1.0000 | ORAL_TABLET | Freq: Every day | ORAL | Status: DC
Start: 2015-02-16 — End: 2015-02-19
  Administered 2015-02-16 – 2015-02-19 (×4): 1 via ORAL
  Filled 2015-02-16 (×4): qty 1

## 2015-02-16 MED ORDER — MIDODRINE HCL 5 MG PO TABS
7.5000 mg | ORAL_TABLET | Freq: Three times a day (TID) | ORAL | Status: DC
  Filled 2015-02-16: qty 1

## 2015-02-16 MED ORDER — POTASSIUM CHLORIDE CRYS ER 20 MEQ PO TBCR
20.0000 meq | EXTENDED_RELEASE_TABLET | Freq: Once | ORAL | Status: DC
  Filled 2015-02-16: qty 1

## 2015-02-16 NOTE — Progress Notes (Signed)
ANTIBIOTIC CONSULT NOTE - INITIAL  Pharmacy Consult for Levaquin Indication: r/o SBP  Allergies  Allergen Reactions  . Penicillins     unknown  . Sulfa Antibiotics     unknown    Patient Measurements: Height: 5\' 5"  (165.1 cm) Weight: 126 lb 5.2 oz (57.3 kg) IBW/kg (Calculated) : 57  Vital Signs: Temp: 97.7 F (36.5 C) (07/01 1518) Temp Source: Oral (07/01 1518) BP: 84/47 mmHg (07/01 1518) Pulse Rate: 76 (07/01 1450) Intake/Output from previous day:   Intake/Output from this shift:    Labs:  Recent Labs  02/15/15 2120 02/16/15 1322  WBC 7.6 7.4  HGB 9.8* 10.9*  PLT 175 152  CREATININE 4.73* 5.14*   Estimated Creatinine Clearance: 14.9 mL/min (by C-G formula based on Cr of 5.14). No results for input(s): VANCOTROUGH, VANCOPEAK, VANCORANDOM, GENTTROUGH, GENTPEAK, GENTRANDOM, TOBRATROUGH, TOBRAPEAK, TOBRARND, AMIKACINPEAK, AMIKACINTROU, AMIKACIN in the last 72 hours.   Microbiology: No results found for this or any previous visit (from the past 720 hour(s)).  Medical History: Past Medical History  Diagnosis Date  . Hepatitis C 11/2012    she suspects this was from a tattoo as she was dxd with Hep C before starting her IVDA.   Marland Kitchen. Asthma     as a child  . Hypophosphatemia 06/29/2014    Phos of 1.4 > 4.0 with supplementation    . Hypokalemia 06/26/2014  . Alcohol addiction   . Polysubstance dependence     including IV heroin. inpt rehab 01/2012.   . C. difficile diarrhea 08/24/2014  . Tachycardia     Assessment: 89 YOF with hx Hep C, EtOH/heroine abuse - who presented on 7/1 with abdominal pain. Pharmacy consulted to start Levaquin for r/o SBP.   The patient is unable to clarify her allergy to PCN - she stated that she has not taken it since she was an infant. She also does not recall ever taking a cephalosporin. Pt noted to be in AKI - SCr 5.14 (baseline <1 in May '16), CrCl<20 ml/min.   Goal of Therapy:  Proper antibiotics for infection/cultures adjusted for  renal/hepatic function   Plan:  1. Levaquin 750 mg IV x 1 dose now 2. Levaquin 500 mg IV every 48 hours (starting on 7/3) 3. Will continue to follow renal function, culture results, LOT, and antibiotic de-escalation plans   Georgina PillionElizabeth Chauntelle Azpeitia, PharmD, BCPS Clinical Pharmacist Pager: [phone] 02/16/2015 4:43 PM

## 2015-02-16 NOTE — ED Notes (Addendum)
[facility] after ED visit last night. For 1 week generalized abd pain, n/v. BP today 76/40, when EMS arrived 90/66, HR 80, RR 16, 97%RA. Abdomen distended. Pt's eyes noted to have yellow tint.

## 2015-02-16 NOTE — Progress Notes (Signed)
Patient feels really tired today.  Patient reports she is in constant pain in abdomen for the past week.  Nothing seems to relieve pain.  9/10, described as pressure.   Refill on zofran.

## 2015-02-16 NOTE — Progress Notes (Signed)
abd pain stable. 1 view abd review c/w possible obstruction. Hepatorenal syndrome seeming much less likely.   Will go ahead and order CT abd/pelvis If this is mostly SBO-->dehydration + diuretics + NSAIDs = AKI than likely octerotide, midodrine and albumin not needed and IV hydration, bowel rest better course of action. CT scan should be helpful.   Mary MartinetPeter E Lexington Garrison ACNP-BC [facility] Pager # [phone] OR # [phone] if no answer

## 2015-02-16 NOTE — Consult Note (Signed)
[facility] Consult: 2:39 PM 02/16/2015  LOS: 0 days    Referring Provider: Dr Dareen Piano.  Primary Care Physician:  Minerva Ends, MD  Of [place]/welllness.  Primary Gastroenterologist:  unassigned    Reason for Consultation:  Abdominal pain.    HPI: Mary Garrison is a 24 y.o. female.  Hx substance abuse, Hep C dxd 11/2012.  Still drinks ETOH so not on Harvoni.  Cdiff diarrhea 08/2014.  Not known to have cirrhosis. HCV quat [id number] in Jan 2016. Depression.   Started 6/28 on Aldactone and Lasix  for clinical appearance of ascites; planned ultrasound today. Was MIA from lab waiting area and planned tox screen on 01/01/2015 after [facility] visit.   Seen at primary care today with progressive ascites and jaundice.  Diminished urine output despite lasix/aldactone. + anorexia. Weight up at least 6 # in past 3 days. Admits to bingeing on Vodka one day last week, otherwise says sober for ~ 2 years and denies opiate/heroin use since 01/2012. Ultrasound shows gallbladder sludge, 4 mm edematous GB wall. CBD 3.3 mm, fatty liver/?chronic liver disease, splenomegaly. T bil  9.5, up from 1 in 08/2014,  3.4 on 5/3, 7.1 on 6/28. Alk phos 240, was 129 5/3, 222 on 5/3,   AST/ALT 240/67, up from 298/66 on 5/3. 222/58 on 6/28 Coags rising. 18.1 and 1.4 now. BUN/creatinine have been rising in last 2 months, today 33 and 4.7.  Up from  14/1.  3 days previously. GFR is 12.  tox screen + for opiates and benzos, neither on her outpt med list.     Past Medical History  Diagnosis Date  . Hepatitis C 11/2012    she suspects this was from a tattoo as she was dxd with Hep C before starting her IVDA.   Marland Kitchen Asthma     as a child  . Hypophosphatemia 06/29/2014    Phos of 1.4 > 4.0 with supplementation    . Hypokalemia 06/26/2014  . Alcohol  addiction   . Polysubstance dependence     including IV heroin. inpt rehab 01/2012.   . C. difficile diarrhea 08/24/2014  . Tachycardia     History reviewed. No pertinent past surgical history.  Prior to Admission medications   Medication Sig Start Date End Date Taking? Authorizing Provider  albuterol (PROVENTIL HFA;VENTOLIN HFA) 108 (90 BASE) MCG/ACT inhaler Inhale 2 puffs into the lungs every 6 (six) hours as needed. For shortness of breath 06/21/14  Yes Josalyn Funches, MD  calcium-vitamin D (OSCAL WITH D) 500-200 MG-UNIT per tablet Take 1 tablet by mouth 2 (two) times daily. 07/26/14  Yes Josalyn Funches, MD  cholecalciferol (VITAMIN D) 1000 UNITS tablet Take 1 tablet (1,000 Units total) by mouth daily. 07/26/14  Yes Josalyn Funches, MD  Cyanocobalamin (B-12 PO) Take 1 tablet by mouth daily.   Yes Historical Provider, MD  Diclofenac Sodium 3 % GEL Place 1 application onto the skin as needed (for muscle pain). 12/27/14  Yes Josalyn Funches, MD  folic acid (FOLVITE) 1 MG tablet Take 1 tablet (1 mg total)  by mouth daily. 06/28/14  Yes Josalyn Funches, MD  furosemide (LASIX) 40 MG tablet Take 1 tablet (40 mg total) by mouth daily. 02/13/15  Yes Josalyn Funches, MD  ibuprofen (ADVIL,MOTRIN) 200 MG tablet Take 600 mg by mouth every 6 (six) hours as needed for mild pain or moderate pain.    Yes Historical Provider, MD  levonorgestrel-ethinyl estradiol (AVIANE) 0.1-20 MG-MCG tablet Take 1 tablet by mouth daily.   Yes Historical Provider, MD  magnesium oxide (MAG-OX) 400 MG tablet Take 400 mg by mouth daily.   Yes Historical Provider, MD  metoprolol succinate (TOPROL-XL) 25 MG 24 hr tablet Take 25 mg by mouth daily.   Yes Historical Provider, MD  Multiple Vitamins-Minerals (MULTIVITAMIN PO) Take 1 tablet by mouth daily.   Yes Historical Provider, MD  ondansetron (ZOFRAN ODT) 8 MG disintegrating tablet Take 1 tablet (8 mg total) by mouth every 8 (eight) hours as needed for nausea or vomiting. 12/27/14  Yes  Josalyn Funches, MD  potassium & sodium phosphates (PHOS-NAK) 280-160-250 MG PACK Take 1 packet by mouth 4 (four) times daily.   Yes Historical Provider, MD  potassium chloride SA (K-DUR,KLOR-CON) 20 MEQ tablet Take 1 tablet (20 mEq total) by mouth daily as needed. 01/17/15  Yes Josalyn Funches, MD  Pyridoxine HCl (B-6 PO) Take 1 tablet by mouth daily.   Yes Historical Provider, MD  ranitidine (ZANTAC) 150 MG tablet Take 1 tablet (150 mg total) by mouth 2 (two) times daily as needed for heartburn. 10/09/14  Yes Boykin Nearing, MD  spironolactone (ALDACTONE) 25 MG tablet Take 1 tablet (25 mg total) by mouth daily. 02/13/15  Yes Josalyn Funches, MD  Thiamine HCl (B-1 PO) Take 1 tablet by mouth daily.   Yes Historical Provider, MD  traMADol (ULTRAM) 50 MG tablet Take 1 tablet (50 mg total) by mouth every 12 (twelve) hours as needed. Patient taking differently: Take 50 mg by mouth every 12 (twelve) hours as needed for moderate pain or severe pain.  02/13/15  Yes Boykin Nearing, MD    Scheduled Meds:  Infusions:  PRN Meds:    Allergies as of 02/16/2015 - Review Complete 02/16/2015  Allergen Reaction Noted  . Penicillins  02/12/2012  . Sulfa antibiotics  02/12/2012    Family History  Problem Relation Age of Onset  . Asthma Mother   . Depression Brother   . Other Brother   . Depression Sister   . Other Sister     History   Social History  . Marital Status: Single    Spouse Name: N/A  . Number of Children: N/A  . Years of Education: N/A   Occupational History  . Not on file.   Social History Main Topics  . Smoking status: Current Every Day Smoker -- 0.50 packs/day for 10 years    Types: Cigarettes  . Smokeless tobacco: Never Used  . Alcohol Use: No     Comment: heavy drinking at times-- last drink last night-- no longer drinking every day  . Drug Use: No     Comment: none in 2.5 years   was opiates  . Sexual Activity: No   Other Topics Concern  . Not on file   Social  History Narrative   Moved from [place] to [place], [place] in 02/2014.    Lives with grandmother, mother, daughter, sister, brother, mom's boyfriend.        REVIEW OF SYSTEMS: Constitutional:  Weight gain of 10 # in last 2 weeks.  ENT:  No  nose bleeds Pulm:  No cough or DOE CV:  No palpitations, no LE edema.  GU:  No hematuria, no frequency GI:  No dysphagia.  Occasional vomiting if she eats too large a volume Heme:  No unusual bleeding or bruising   Transfusions:  none Neuro:  Grandmother says pt has accused family members of doing things that were never done.  Says pt is argumentative and agitated at times.  Pt also reports disturbance of vision (?palinopsia) Derm:  No itching, no rash or sores.  Endocrine:  No sweats or chills.  + oliguria and dark urine.  Immunization:  Hep B and hep A vaccinated.  Travel:  None beyond local  in last few months.    PHYSICAL EXAM: Vital signs in last 24 hours: Filed Vitals:   02/16/15 1330  BP: 88/45  Pulse: 76  Temp:   Resp: 15   Wt Readings from Last 3 Encounters:  02/16/15 124 lb (56.246 kg)  02/15/15 115 lb (52.164 kg)  02/13/15 118 lb (53.524 kg)    General: sallow, jaundiced, chronically ill looking WF  Head:  No swelling or asymmetry  Eyes:  + icterus Ears:  Not HOH  Nose:  No congestion or discharge Mouth:  Moist, clear oral MM Neck:  No mass or JVD Lungs:  Clear bil.  No rales, no ronchi , no wheezes.  Pitting edema in lower back Heart: RRR.  No MRG Abdomen:  Protuberant, tight, distended. Diffusely mildly tender.  BS present.  No discrete masses. + splemomegaly.   Rectal: deferred   Musc/Skeltl: no joint swellilng, deformity or erythema.  Extremities:  No pedal or LE edema   Neurologic:  Oriented x 3.  No asterixis or tremor.  Skin:  Jaundiced/sallow.  Tattoos:  Yes, on leg, arm Nodes:  No cervical adenopathy.    Psych:  Cooperative, depressed.  No anxious, not somnolent.   Intake/Output from previous day:     Intake/Output this shift:    LAB RESULTS:  Recent Labs  02/15/15 2120 02/16/15 1322  WBC 7.6 7.4  HGB 9.8* 10.9*  HCT 28.4* 31.3*  PLT 175 152   BMET Lab Results  Component Value Date   NA 120* 02/16/2015   NA 120* 02/15/2015   NA 128* 02/13/2015   K 3.3* 02/16/2015   K 3.2* 02/15/2015   K 3.2* 02/13/2015   CL 78* 02/16/2015   CL 79* 02/15/2015   CL 84* 02/13/2015   CO2 27 02/16/2015   CO2 25 02/15/2015   CO2 28 02/13/2015   GLUCOSE 82 02/16/2015   GLUCOSE 94 02/15/2015   GLUCOSE 83 02/13/2015   BUN 37* 02/16/2015   BUN 33* 02/15/2015   BUN 14 02/13/2015   CREATININE 5.14* 02/16/2015   CREATININE 4.73* 02/15/2015   CREATININE 1.38* 02/13/2015   CALCIUM 6.8* 02/16/2015   CALCIUM 7.0* 02/15/2015   CALCIUM 7.6* 02/13/2015   LFT  Recent Labs  02/15/15 2120 02/16/15 1322  PROT 5.9* 5.8*  ALBUMIN 2.2* 2.1*  AST 240* 230*  ALT 67* 69*  ALKPHOS 165* 164*  BILITOT 9.5* 9.8*   PT/INR Lab Results  Component Value Date   INR 1.49 02/15/2015   INR 1.50 02/13/2015   INR 1.23 12/18/2014   Hepatitis Panel No results for input(s): HEPBSAG, HCVAB, HEPAIGM, HEPBIGM in the last 72 hours. C-Diff No components found for: CDIFF Lipase     Component Value Date/Time   LIPASE 21* 02/16/2015 1322    Drugs of Abuse  Component Value Date/Time   LABOPIA POSITIVE* 12/19/2014 0100   COCAINSCRNUR NONE DETECTED 12/19/2014 0100   LABBENZ POSITIVE* 12/19/2014 0100   AMPHETMU NONE DETECTED 12/19/2014 0100   THCU NONE DETECTED 12/19/2014 0100   LABBARB NONE DETECTED 12/19/2014 0100     RADIOLOGY STUDIES: Dg Chest 2 View  02/16/2015   CLINICAL DATA:  24 year old with abdominal pain, nausea and vomiting. History of hepatitis-C and asthma. Ascites.  EXAM: CHEST  2 VIEW  COMPARISON:  None.  FINDINGS: The heart size is at the upper limits of normal, likely exaggerated by a narrow AP diameter of the chest. There is bibasilar atelectasis with central airway thickening, but  no edema or confluent airspace opacity. There is no pleural effusion. The abdomen is distended. No ascites was demonstrated on abdominal ultrasound performed earlier today.  IMPRESSION: Bibasilar atelectasis without focal airspace disease or significant pleural effusion.   Electronically Signed   By: Richardean Sale M.D.   On: 02/16/2015 12:26   US Abdomen Complete  02/16/2015   CLINICAL DATA:  Hepatitis C, ascites  EXAM: ULTRASOUND ABDOMEN COMPLETE  COMPARISON:  None.  FINDINGS: Gallbladder: No shadowing gallstones are noted within gallbladder. Gallbladder sludge is noted. There is mild edema gallbladder wall measures 4.4 mm thickness. No sonographic Murphy's sign.  Common bile duct: Diameter: 3.3 mm in diameter.  Liver: Hepatomegaly is noted. There is diffuse increased echogenicity of the liver consistent with fatty infiltration or chronic hepatitis.  IVC: No abnormality visualized.  Pancreas: Obscured by overlying bowel gas.  Spleen: Size and appearance within normal limits.  Right Kidney: Length: 13 cm in length. Normal echogenicity. Mild lobulated contour. No hydronephrosis or focal mass.  Left Kidney: Length: 13 cm in length. Echogenicity within normal limits. No mass or hydronephrosis visualized.  Abdominal aorta: No aneurysm.  Measures up to 1.7 cm in diameter.  Other findings: None.  IMPRESSION: 1. No shadowing gallstones are noted within gallbladder. Layering gallbladder sludge. There is mild thickening of gallbladder wall with edematous appearance measures 4.4 mm thickness. No sonographic Murphy's sign. 2. Normal CBD. 3. Diffuse increased echogenicity of the liver consistent with fatty infiltration or chronic liver disease. No focal hepatic mass. 4. Splenomegaly is noted.  Spleen measures 15.6 cm in length.   Electronically Signed   By: Lahoma Crocker M.D.   On: 02/16/2015 08:58    ENDOSCOPIC STUDIES: None ever.   IMPRESSION:   *  Worsening of chronic liver disease, likely has cirrhosis given  ultrasound findings.  Transaminase pattern c/w ETOH hepatitis. Suspect she is minimizing her ETOH input. Discriminant function ~ 35, indicating prednisolone may improve survival.   *  Ascites, abdominal pain.  R/o SBP.  Paracentesis ordered.   *  AKI in setting of recently initiated lasix/aldactone.  R/o hepatorenal syndrome.    *  Polysubstance and ETOH abuse. Agree that pt is likely consuming ETOH more frequently than she is fessing up to.   *  Hyponatremia    PLAN:     *  Diagnostic paracentesis.  Hold diuretics as doing. May need to initiate:  octreotide 100-265mg sq tid, midodrine starting 7.5 mg tid working up to 15 mg tid, Albumin boluses for 2 days: 1g/kg per day up to 100 mg daily dose.   *  Check urine NA. Hepatitis acute panel previously ordered.   *  Added prednisolone 40 mg daily.   *  ? Renal consult.     SAzucena Freed 02/16/2015, 2:39 PM Pager: [phone]

## 2015-02-16 NOTE — ED Notes (Signed)
RN attempt IV, unsuccessful. Pt with multiple IV attempts last night.

## 2015-02-16 NOTE — Progress Notes (Signed)
Patient seen and evaluated at start of shift.   She has no acute complaints other than hunger.  She endorses diffuse abdominal pain, unchanged from previous.  She is hungry and wants to eat.  Her BP improved to 104/50 following 500 cc bolus, but then returned to 70s/40s.  PE: Vitals: O2 sats of 92-93% on 2L [place]. BP 76/44 General: Stable appearing female lying in bed. Pulm: Lungs CTAB CV: RRR. Abd: BS present.  Abdomen large, diffuse in all quadrants.  Liver enlarged Ext: No peripheral edema Skin: Jaundiced  A/P: Patient currently stable.  Awaiting follow-up recommendations from PCCM. Will administer another 500 cc bolus to maintain BP. - 500 cc NS bolus

## 2015-02-16 NOTE — ED Provider Notes (Signed)
CSN: [id number]     Arrival date & time 02/16/15  1037 History   First MD Initiated Contact with Patient 02/16/15 1051     Chief Complaint  Patient presents with  . Abdominal Pain     (Consider location/radiation/quality/duration/timing/severity/associated sxs/prior Treatment) HPI Patient followed up with her family physician today after emergency department visit yesterday evening. Her physician has sent her by EMS back to the emergency department for admission and inpatient management. The patient has chronic hep C with liver failure. She has developed hepatorenal syndrome with renal failure over the past 3 days. They have been attempting outpatient diuresis with Lasix and Aldactone, the patient however reports decreasing urine output and increasing ascites. She denies she's had any fever. Her abdomen has been getting increasingly distended. Causing her to feel more short of breath as well. She reports her abdomen has generally been hurting for quite a while and it just feels very tight and uncomfortable. She reports her appetite has decreased a lot and off and she cannot eat or she may vomit if she eats. She does however report yesterday evening she was able to tolerate some hot dog without vomiting. Review of the EMR family physician note indicates that the patient has had chronic hypotension limiting ability to diuresis and reduce her volume. The patient reports that she has stopped using drugs and quit drinking. She is not experiencing any withdrawal this time. She reports she already went through that and that is over. Past Medical History  Diagnosis Date  . Hepatitis C 11/16/2013    suspected she contracted it while getting a tatoo. denies IVDU.   Marland Kitchen Asthma     as a child  . Hypophosphatemia 06/29/2014    Phos of 1.4 > 4.0 with supplementation    . Hypokalemia 06/26/2014  . Alcohol addiction   . Polysubstance dependence   . Heroin addiction   . IV drug user   . C. difficile diarrhea  08/24/2014  . Tachycardia    History reviewed. No pertinent past surgical history. Family History  Problem Relation Age of Onset  . Asthma Mother   . Depression Brother   . Other Brother   . Depression Sister   . Other Sister    History  Substance Use Topics  . Smoking status: Current Every Day Smoker -- 0.50 packs/day for 10 years    Types: Cigarettes  . Smokeless tobacco: Never Used  . Alcohol Use: No     Comment: heavy drinking at times-- last drink last night-- no longer drinking every day   OB History    No data available     Review of Systems 10 Systems reviewed and are negative for acute change except as noted in the HPI.    Allergies  Penicillins and Sulfa antibiotics  Home Medications   Prior to Admission medications   Medication Sig Start Date End Date Taking? Authorizing Provider  albuterol (PROVENTIL HFA;VENTOLIN HFA) 108 (90 BASE) MCG/ACT inhaler Inhale 2 puffs into the lungs every 6 (six) hours as needed. For shortness of breath 06/21/14  Yes Josalyn Funches, MD  calcium-vitamin D (OSCAL WITH D) 500-200 MG-UNIT per tablet Take 1 tablet by mouth 2 (two) times daily. 07/26/14  Yes Josalyn Funches, MD  cholecalciferol (VITAMIN D) 1000 UNITS tablet Take 1 tablet (1,000 Units total) by mouth daily. 07/26/14  Yes Josalyn Funches, MD  Cyanocobalamin (B-12 PO) Take 1 tablet by mouth daily.   Yes Historical Provider, MD  Diclofenac Sodium 3 % GEL  Place 1 application onto the skin as needed (for muscle pain). 12/27/14  Yes Dessa PhiJosalyn Funches, MD  folic acid (FOLVITE) 1 MG tablet Take 1 tablet (1 mg total) by mouth daily. 06/28/14  Yes Josalyn Funches, MD  furosemide (LASIX) 40 MG tablet Take 1 tablet (40 mg total) by mouth daily. 02/13/15  Yes Josalyn Funches, MD  ibuprofen (ADVIL,MOTRIN) 200 MG tablet Take 600 mg by mouth every 6 (six) hours as needed for mild pain or moderate pain.    Yes Historical Provider, MD  levonorgestrel-ethinyl estradiol (AVIANE) 0.1-20 MG-MCG tablet  Take 1 tablet by mouth daily.   Yes Historical Provider, MD  magnesium oxide (MAG-OX) 400 MG tablet Take 400 mg by mouth daily.   Yes Historical Provider, MD  metoprolol succinate (TOPROL-XL) 25 MG 24 hr tablet Take 25 mg by mouth daily.   Yes Historical Provider, MD  Multiple Vitamins-Minerals (MULTIVITAMIN PO) Take 1 tablet by mouth daily.   Yes Historical Provider, MD  ondansetron (ZOFRAN ODT) 8 MG disintegrating tablet Take 1 tablet (8 mg total) by mouth every 8 (eight) hours as needed for nausea or vomiting. 12/27/14  Yes Josalyn Funches, MD  potassium & sodium phosphates (PHOS-NAK) 280-160-250 MG PACK Take 1 packet by mouth 4 (four) times daily.   Yes Historical Provider, MD  potassium chloride SA (K-DUR,KLOR-CON) 20 MEQ tablet Take 1 tablet (20 mEq total) by mouth daily as needed. 01/17/15  Yes Josalyn Funches, MD  Pyridoxine HCl (B-6 PO) Take 1 tablet by mouth daily.   Yes Historical Provider, MD  ranitidine (ZANTAC) 150 MG tablet Take 1 tablet (150 mg total) by mouth 2 (two) times daily as needed for heartburn. 10/09/14  Yes Dessa PhiJosalyn Funches, MD  spironolactone (ALDACTONE) 25 MG tablet Take 1 tablet (25 mg total) by mouth daily. 02/13/15  Yes Josalyn Funches, MD  Thiamine HCl (B-1 PO) Take 1 tablet by mouth daily.   Yes Historical Provider, MD  traMADol (ULTRAM) 50 MG tablet Take 1 tablet (50 mg total) by mouth every 12 (twelve) hours as needed. Patient taking differently: Take 50 mg by mouth every 12 (twelve) hours as needed for moderate pain or severe pain.  02/13/15  Yes Josalyn Funches, MD   BP 91/49 mmHg  Pulse 80  Temp(Src) 98.8 F (37.1 C) (Oral)  Resp 18  SpO2 93%  LMP  (Within Months) Physical Exam  Constitutional: She is oriented to person, place, and time.  Patient is alert and in no distress. Respirations are calm and nonlabored. Clearly jaundiced in appearance. She is nontoxic.  HENT:  Head: Normocephalic and atraumatic.  Mouth/Throat: Oropharynx is clear and moist. No  oropharyngeal exudate.  Eyes: EOM are normal. Pupils are equal, round, and reactive to light. Scleral icterus is present.  Neck: Neck supple.  Cardiovascular: Normal rate, regular rhythm, normal heart sounds and intact distal pulses.   Pulmonary/Chest: Effort normal.  Decreased breath sounds left base.  Abdominal: She exhibits distension. There is tenderness.  Patient's abdomen is firm and distended. She has hepatosplenomegaly. There are no peritoneal signs, she endorses tenderness diffusely. No guarding, positive ascites. Abdominal wall is not erythematous.  Musculoskeletal: Normal range of motion. She exhibits no edema or tenderness.  Neurological: She is alert and oriented to person, place, and time. No cranial nerve deficit. She exhibits normal muscle tone. Coordination normal.  Skin: Skin is warm and dry.  Skin is jaundiced.  Psychiatric: She has a normal mood and affect.    ED Course  Procedures (including critical care  time) Angiocath insertion Performed by: Arby Barrette  Consent: Verbal consent obtained. Risks and benefits: risks, benefits and alternatives were discussed   Preparation: Patient was prepped and draped in the usual sterile fashion.  Vein Location: right basilic  Ultrasound Guided  Gauge: 20  Normal blood return and flush without difficulty Patient tolerance: Patient tolerated the procedure well with no immediate complications.   Labs Review Labs Reviewed  COMPREHENSIVE METABOLIC PANEL  LIPASE, BLOOD  CBC WITH DIFFERENTIAL/PLATELET  PROTIME-INR  URINALYSIS, ROUTINE W REFLEX MICROSCOPIC (NOT AT [facility])  I-STAT BETA HCG BLOOD, ED ([facility], [facility], [facility] ONLY)  TYPE AND SCREEN    Imaging Review Dg Chest 2 View  02/16/2015   CLINICAL DATA:  24 year old with abdominal pain, nausea and vomiting. History of hepatitis-C and asthma. Ascites.  EXAM: CHEST  2 VIEW  COMPARISON:  None.  FINDINGS: The heart size is at the upper limits of normal, likely exaggerated by a  narrow AP diameter of the chest. There is bibasilar atelectasis with central airway thickening, but no edema or confluent airspace opacity. There is no pleural effusion. The abdomen is distended. No ascites was demonstrated on abdominal ultrasound performed earlier today.  IMPRESSION: Bibasilar atelectasis without focal airspace disease or significant pleural effusion.   Electronically Signed   By: Carey Bullocks M.D.   On: 02/16/2015 12:26   US Abdomen Complete  02/16/2015   CLINICAL DATA:  Hepatitis C, ascites  EXAM: ULTRASOUND ABDOMEN COMPLETE  COMPARISON:  None.  FINDINGS: Gallbladder: No shadowing gallstones are noted within gallbladder. Gallbladder sludge is noted. There is mild edema gallbladder wall measures 4.4 mm thickness. No sonographic Murphy's sign.  Common bile duct: Diameter: 3.3 mm in diameter.  Liver: Hepatomegaly is noted. There is diffuse increased echogenicity of the liver consistent with fatty infiltration or chronic hepatitis.  IVC: No abnormality visualized.  Pancreas: Obscured by overlying bowel gas.  Spleen: Size and appearance within normal limits.  Right Kidney: Length: 13 cm in length. Normal echogenicity. Mild lobulated contour. No hydronephrosis or focal mass.  Left Kidney: Length: 13 cm in length. Echogenicity within normal limits. No mass or hydronephrosis visualized.  Abdominal aorta: No aneurysm.  Measures up to 1.7 cm in diameter.  Other findings: None.  IMPRESSION: 1. No shadowing gallstones are noted within gallbladder. Layering gallbladder sludge. There is mild thickening of gallbladder wall with edematous appearance measures 4.4 mm thickness. No sonographic Murphy's sign. 2. Normal CBD. 3. Diffuse increased echogenicity of the liver consistent with fatty infiltration or chronic liver disease. No focal hepatic mass. 4. Splenomegaly is noted.  Spleen measures 15.6 cm in length.   Electronically Signed   By: Natasha Mead M.D.   On: 02/16/2015 08:58     EKG  Interpretation None      MDM   Final diagnoses:  Hepatorenal syndrome  Chronic hepatitis C without hepatic coma  Alcoholic cirrhosis of liver with ascites  Acute renal failure, unspecified acute renal failure type   She has multiple chronic medical problems primarily today hepatorenal failure due to cirrhosis from hepatitis C and alcoholism. Patient does have hypotensive blood pressures, she is however clinically stable with clear mental status and no signs of hypoperfusion. The patient does chronically have low blood pressures. Patient has in the interim past few days develop renal failure with abdominal ascites. She'll be admitted for ongoing management of hepatorenal syndrome without evidence at this point time of SBP or other acute infection.    Arby Barrette, MD 02/16/15 1340

## 2015-02-16 NOTE — ED Notes (Signed)
MD made aware that Admitting stated Central Line would be better than PICC. Verbalized understanding. MD is going to attempt US IV before patient is able to be taken upstairs.

## 2015-02-16 NOTE — ED Notes (Signed)
Admitting MD at the bedside. Made aware of patient's IV situation.

## 2015-02-16 NOTE — ED Notes (Signed)
IV Team at the bedside. 

## 2015-02-16 NOTE — H&P (Signed)
Date: 02/16/2015               Patient Name:  Mary Garrison MRN: [id number]  DOB: April 05, 1991 Age / Sex: 24 y.o., female   PCP: Dessa PhiJosalyn Funches, MD         Medical Service: Internal Medicine Teaching Service         Attending Physician: Dr. Earl LagosNischal Narendra, MD    First Contact: Dr. Isabella BowensKrall Pager: [phone]  Second Contact: Dr. Yetta BarreJones Pager: [phone]       After Hours (After 5p/  First Contact Pager: [phone]  weekends / holidays): Second Contact Pager: [phone]   Chief Complaint: abdominal pain  History of Present Illness: Mary Garrison is a 24 year old woman with history of hepatitis C, alcohol abuse, heroin abuse presenting with abdominal pain. It started 1 week ago. It is epigastric and feels like a contraction. It is constant. Denies relieving factors. Worsening after eating. She has never had this pain before. She has had associated increase in her abdominal girth. She has had intermittent nausea and vomiting but denies hemetemesis. Denies diarrhea or steatorrhea. Her last BM was yesterday. She has some mild dysuria but denies hematuria. She has noted a decrease in her urine output and her urine is darker. She has noticed jaundice for the past 2 days and pruritis. She also has subjective fevers, generalized weakness, cough, shortness of breath and some lightheadedness with standing. Denies vision changes, chest pain, edema, rash.  She is in recovery from her alcohol abuse but had a relapse last week on Wednesday about 3 days before her symptoms started. Her hepatitis C was diagnosed 11/2013 which she attributes to having a tattoo done.  Meds: No current facility-administered medications for this encounter.   Current Outpatient Prescriptions  Medication Sig Dispense Refill  . albuterol (PROVENTIL HFA;VENTOLIN HFA) 108 (90 BASE) MCG/ACT inhaler Inhale 2 puffs into the lungs every 6 (six) hours as needed. For shortness of breath 8 g 5  . calcium-vitamin D (OSCAL WITH D) 500-200 MG-UNIT  per tablet Take 1 tablet by mouth 2 (two) times daily. 180 tablet 1  . cholecalciferol (VITAMIN D) 1000 UNITS tablet Take 1 tablet (1,000 Units total) by mouth daily. 90 tablet 3  . Cyanocobalamin (B-12 PO) Take 1 tablet by mouth daily.    . Diclofenac Sodium 3 % GEL Place 1 application onto the skin as needed (for muscle pain). 100 g 0  . folic acid (FOLVITE) 1 MG tablet Take 1 tablet (1 mg total) by mouth daily. 90 tablet 3  . furosemide (LASIX) 40 MG tablet Take 1 tablet (40 mg total) by mouth daily. 30 tablet 3  . ibuprofen (ADVIL,MOTRIN) 200 MG tablet Take 600 mg by mouth every 6 (six) hours as needed for mild pain or moderate pain.     Marland Kitchen. levonorgestrel-ethinyl estradiol (AVIANE) 0.1-20 MG-MCG tablet Take 1 tablet by mouth daily.    . magnesium oxide (MAG-OX) 400 MG tablet Take 400 mg by mouth daily.    . metoprolol succinate (TOPROL-XL) 25 MG 24 hr tablet Take 25 mg by mouth daily.    . Multiple Vitamins-Minerals (MULTIVITAMIN PO) Take 1 tablet by mouth daily.    . ondansetron (ZOFRAN ODT) 8 MG disintegrating tablet Take 1 tablet (8 mg total) by mouth every 8 (eight) hours as needed for nausea or vomiting. 30 tablet 0  . potassium & sodium phosphates (PHOS-NAK) 280-160-250 MG PACK Take 1 packet by mouth 4 (four) times daily.    . potassium chloride  SA (K-DUR,KLOR-CON) 20 MEQ tablet Take 1 tablet (20 mEq total) by mouth daily as needed. 30 tablet 3  . Pyridoxine HCl (B-6 PO) Take 1 tablet by mouth daily.    . ranitidine (ZANTAC) 150 MG tablet Take 1 tablet (150 mg total) by mouth 2 (two) times daily as needed for heartburn. 60 tablet 0  . spironolactone (ALDACTONE) 25 MG tablet Take 1 tablet (25 mg total) by mouth daily. 30 tablet 3  . Thiamine HCl (B-1 PO) Take 1 tablet by mouth daily.    . traMADol (ULTRAM) 50 MG tablet Take 1 tablet (50 mg total) by mouth every 12 (twelve) hours as needed. (Patient taking differently: Take 50 mg by mouth every 12 (twelve) hours as needed for moderate pain  or severe pain. ) 60 tablet 1    Allergies: Allergies as of 02/16/2015 - Review Complete 02/16/2015  Allergen Reaction Noted  . Penicillins  02/12/2012  . Sulfa antibiotics  02/12/2012   Past Medical History  Diagnosis Date  . Hepatitis C 11/16/2013    suspected she contracted it while getting a tatoo. denies IVDU.   Marland Kitchen Asthma     as a child  . Hypophosphatemia 06/29/2014    Phos of 1.4 > 4.0 with supplementation    . Hypokalemia 06/26/2014  . Alcohol addiction   . Polysubstance dependence   . Heroin addiction   . IV drug user   . C. difficile diarrhea 08/24/2014  . Tachycardia    History reviewed. No pertinent past surgical history. Family History  Problem Relation Age of Onset  . Asthma Mother   . Depression Brother   . Other Brother   . Depression Sister   . Other Sister    History   Social History  . Marital Status: Single    Spouse Name: N/A  . Number of Children: N/A  . Years of Education: N/A   Occupational History  . Not on file.   Social History Main Topics  . Smoking status: Current Every Day Smoker -- 0.50 packs/day for 10 years    Types: Cigarettes  . Smokeless tobacco: Never Used  . Alcohol Use: No     Comment: heavy drinking at times-- last drink last night-- no longer drinking every day  . Drug Use: No     Comment: none in 2.5 years   was opiates  . Sexual Activity: No   Other Topics Concern  . Not on file   Social History Narrative   Moved from [place] to [place], [place] in 02/2014.    Lives with grandmother, mother, daughter, sister, brother, mom's boyfriend.        Review of Systems: Constitutional: +subjective fevers/chills Eyes: no vision changes Ears, nose, mouth, throat, and face: no cough Respiratory: +shortness of breath Cardiovascular: no chest pain Gastrointestinal: +nausea/vomiting, +abdominal pain, no constipation, no diarrhea Genitourinary: +dysuria, no hematuria Integument: no rash Hematologic/lymphatic: no  bleeding/bruising, no edema Musculoskeletal: no arthralgias, no myalgias Neurological: no paresthesias, no weakness  Physical Exam: Blood pressure 91/49, pulse 80, temperature 98.8 F (37.1 C), temperature source Oral, resp. rate 18, SpO2 93 %. General Apperance: NAD Head: Normocephalic, atraumatic Eyes: PERRL, EOMI, +icteric sclera Ears: Normal external ear canal Nose: Nares normal, septum midline, mucosa normal Throat: Lips, mucosa and tongue normal  Neck: Supple, trachea midline Back: No tenderness or bony abnormality  Lungs: Clear to auscultation bilaterally. No wheezes, rhonchi or rales. Breathing comfortably Chest Wall: Nontender, no deformity Heart: Regular rate and rhythm, no murmur/rub/gallop Abdomen:  Moderately tense, tender most in epigastric region, distended, no rebound/guarding Extremities: Normal, atraumatic, warm and well perfused, no edema Pulses: 2+ throughout Skin: No rashes or lesions, +jaundice Neurologic: Alert and oriented x 3. CNII-XII intact. Normal strength and sensation.  Lab results: Basic Metabolic Panel:  Recent Labs  16/10/96 2120  NA 120*  K 3.2*  CL 79*  CO2 25  GLUCOSE 94  BUN 33*  CREATININE 4.73*  CALCIUM 7.0*   Liver Function Tests:  Recent Labs  02/15/15 2120  AST 240*  ALT 67*  ALKPHOS 165*  BILITOT 9.5*  PROT 5.9*  ALBUMIN 2.2*    Recent Labs  02/15/15 2120  LIPASE 21*    Recent Labs  02/15/15 2120  AMMONIA 64*   CBC:  Recent Labs  02/15/15 2120  WBC 7.6  NEUTROABS 5.5  HGB 9.8*  HCT 28.4*  MCV 97.6  PLT 175   Coagulation:  Recent Labs  02/15/15 2120  LABPROT 18.1*  INR 1.49   Urine Drug Screen: Drugs of Abuse     Component Value Date/Time   LABOPIA POSITIVE* 12/19/2014 0100   COCAINSCRNUR NONE DETECTED 12/19/2014 0100   LABBENZ POSITIVE* 12/19/2014 0100   AMPHETMU NONE DETECTED 12/19/2014 0100   THCU NONE DETECTED 12/19/2014 0100   LABBARB NONE DETECTED 12/19/2014 0100    Alcohol  Level:  Recent Labs  02/15/15 2120  ETH <5   Urinalysis: No results for input(s): COLORURINE, LABSPEC, PHURINE, GLUCOSEU, HGBUR, BILIRUBINUR, KETONESUR, PROTEINUR, UROBILINOGEN, NITRITE, LEUKOCYTESUR in the last 72 hours.  Invalid input(s): APPERANCEUR  Misc. Labs: HCG <5  Imaging results:  Dg Chest 2 View  02/16/2015   CLINICAL DATA:  24 year old with abdominal pain, nausea and vomiting. History of hepatitis-C and asthma. Ascites.  EXAM: CHEST  2 VIEW  COMPARISON:  None.  FINDINGS: The heart size is at the upper limits of normal, likely exaggerated by a narrow AP diameter of the chest. There is bibasilar atelectasis with central airway thickening, but no edema or confluent airspace opacity. There is no pleural effusion. The abdomen is distended. No ascites was demonstrated on abdominal ultrasound performed earlier today.  IMPRESSION: Bibasilar atelectasis without focal airspace disease or significant pleural effusion.   Electronically Signed   By: Carey Bullocks M.D.   On: 02/16/2015 12:26   US Abdomen Complete  02/16/2015   CLINICAL DATA:  Hepatitis C, ascites  EXAM: ULTRASOUND ABDOMEN COMPLETE  COMPARISON:  None.  FINDINGS: Gallbladder: No shadowing gallstones are noted within gallbladder. Gallbladder sludge is noted. There is mild edema gallbladder wall measures 4.4 mm thickness. No sonographic Murphy's sign.  Common bile duct: Diameter: 3.3 mm in diameter.  Liver: Hepatomegaly is noted. There is diffuse increased echogenicity of the liver consistent with fatty infiltration or chronic hepatitis.  IVC: No abnormality visualized.  Pancreas: Obscured by overlying bowel gas.  Spleen: Size and appearance within normal limits.  Right Kidney: Length: 13 cm in length. Normal echogenicity. Mild lobulated contour. No hydronephrosis or focal mass.  Left Kidney: Length: 13 cm in length. Echogenicity within normal limits. No mass or hydronephrosis visualized.  Abdominal aorta: No aneurysm.  Measures up to  1.7 cm in diameter.  Other findings: None.  IMPRESSION: 1. No shadowing gallstones are noted within gallbladder. Layering gallbladder sludge. There is mild thickening of gallbladder wall with edematous appearance measures 4.4 mm thickness. No sonographic Murphy's sign. 2. Normal CBD. 3. Diffuse increased echogenicity of the liver consistent with fatty infiltration or chronic liver disease. No focal hepatic mass.  4. Splenomegaly is noted.  Spleen measures 15.6 cm in length.   Electronically Signed   By: Natasha Mead M.D.   On: 02/16/2015 08:58    Assessment & Plan by Problem: Active Problems:   Hepatorenal syndrome  Acute hepatitis: MELD 33, Child-Pugh C. History of heavy alcohol use and AST:ALT ratio of ?2. Also has a history of hepatitis C. Differential includes alcoholic hepatitis, viral hepatitis, hepatotoxicity from drugs or toxins, ischemic hepatopathy, and malignancy. EtOH level negative. Negative serum pregnancy. US abdomen with no cholelithiasis, +layering gallbladder sludge and mild thickening of gallbladder wall, no sonographic Murphy's sign, normal CBD, diffuse increased echogenicity of the liver with no focal hepatic mass, splenomegaly. PT 18.1 and bilirubin 9.8 - discriminant function of 37.4. No signs of encephalopathy. -acetaminophen level -acute hepatitis panel -prednisolone 40 mg daily -GI consulted  Hepatorenal syndrome: +acute hepatic disease. AKI with cr on admission 5.14. Her creatinine was 1.38 on 02/13/2015 and 0.54 12/27/2014. Hypotensive but without tachycardia, making shock less likely etiology of her AKI. Kidneys with no evidence of obstruction or parenchymal renal disease on [place]. Other differential includes nephrotoxins - recently on furosemide and spironolactone. Will need to evaluate for SBP. -Diagnostic paracentesis -IV ceftriaxone 2g/day -Blood culture x 2 -UA -Urine sodium, urine creatinine -May consider midodrine, octreotide, and albumin -Renal  consulted  Hyponatremia: likely 2/2 cirrhosis. May also be related to her renal failure. -Continue to monitor  Hypokalemia: KCl PO x 1  Increased anion gap: Bicarb wnl at 27.  -Continue to monitor  Normocytic anemia: Hgb 10.9 on admission. Baseline is around 11.  Alcohol abuse: -CIWA protocol  FEN: NPO  VTE ppx: SQ Heparin  Dispo: Disposition is deferred at this time, awaiting improvement of current medical problems. Anticipated discharge in approximately 1-2 day(s).   The patient does have a current PCP (Dessa Phi, MD) and does not need an [facility] hospital follow-up appointment after discharge.  The patient does not have transportation limitations that hinder transportation to clinic appointments.  Signed: Lora Paula, MD PGY-1 02/16/2015, 1:32 PM

## 2015-02-16 NOTE — ED Notes (Signed)
Patient is in Xray. When patient returns, MD to attempt IV. If she does not access, patient is to have PICC Line placed.

## 2015-02-16 NOTE — Progress Notes (Signed)
PCCM ordered KUB, which was consistent with possible SBO. They ordered CT abdomen/pelvis for more definitive evaluation.  Per their note, hepatorenal syndrome seeming less likely.    Subjective: Findings of KUB communicated to patient.  Her last BM was 2 days ago.  She endorses mild nausea, which she attributes to not sleeping.  She is passing flatus.  Objective: BP stable with IVF Abdominal exam unchanged from previous, with abdomen full and minimally tender to palpation. BS present.  A/P: Possible SBO: - NPO - F/u CT abd/pelvis

## 2015-02-16 NOTE — Progress Notes (Addendum)
BP low. Concern for hepatorenal syndrome.  Plan: -Started octreotide, albumin, and midodrine. -She is receiving prednisolone 40mg  daily -Given 500cc bolus -Discussed with PCCM who will evaluate patient  Mary BasilJennifer Mackinley Kiehn, MD Internal Medicine Teaching Service

## 2015-02-16 NOTE — Progress Notes (Signed)
Nurse called [employer] for transportation to ED at Dr. Armen PickupFunches request. Ambulance is currently en route.   Nurse called, [facility].

## 2015-02-16 NOTE — Progress Notes (Signed)
UR COMPLETED  

## 2015-02-16 NOTE — Progress Notes (Signed)
Pt refused BMP.  States she has been stuck 34 times today and will not allow another stick tonight.  Dr. Ladona Ridgelaylor notified.

## 2015-02-16 NOTE — Progress Notes (Signed)
   Subjective:    Patient ID: Mary Garrison, female    DOB: [birth year], 24 y.o.   MRN: [id number] CC: worsening ascites, not urinating, went to ED last night  HPI 24 yo F with Hep C and alcohol abuse presented for lab visit, noted to have worsening ascites and jaundice. She reports poor urine output despite lasix and aldactone. She is unable t eat. In ED labs were drawn, she was unable to get an IV placed, no paracentesis done.   ABUS 02/16/2015 IMPRESSION: 1. No shadowing gallstones are noted within gallbladder. Layering gallbladder sludge. There is mild thickening of gallbladder wall with edematous appearance measures 4.4 mm thickness. No sonographic Murphy's sign. 2. Normal CBD. 3. Diffuse increased echogenicity of the liver consistent with fatty infiltration or chronic liver disease. No focal hepatic mass. 4. Splenomegaly is noted. Spleen measures 15.6 cm in length.  Lab Results  Component Value Date   CREATININE 4.73* 02/15/2015   Up from baseline of 0.6-0.7  Soc Hx: no ETOH x one week  Review of Systems  Constitutional: Positive for fatigue. Negative for fever and chills.  Respiratory: Positive for shortness of breath.   Cardiovascular: Negative for chest pain.  Gastrointestinal: Positive for abdominal distention. Negative for nausea, vomiting, abdominal pain and blood in stool.  Skin: Negative for rash.  Psychiatric/Behavioral: Negative for suicidal ideas and dysphoric mood.       Objective:   Physical Exam BP 76/40 mmHg  Pulse 78  Temp(Src) 98.2 F (36.8 C) (Oral)  Resp 16  Wt 124 lb (56.246 kg)  SpO2 98%  Wt Readings from Last 3 Encounters:  02/16/15 124 lb (56.246 kg)  02/15/15 115 lb (52.164 kg)  02/13/15 118 lb (53.524 kg)    General appearance: alert, cooperative and no distress Lungs: clear to auscultation bilaterally Heart: regular rate and rhythm, S1, S2 normal, no murmur, click, rub or gallop Abdomen: abdominal distension, tense,  round Extremities: extremities normal, atraumatic, no cyanosis or edema       Assessment & Plan:   24 yo F with known Hep C and alcoholic cirrhosis intolerant of lasix and aldactone due to low BP with worsening ascites and acute kidney failure over past 3 days.   [facility] called for transfer to hospital for admission and therapeutic paracentesis.

## 2015-02-16 NOTE — Consult Note (Signed)
PULMONARY / CRITICAL CARE MEDICINE   Name: Mary Garrison MRN: [id number] DOB: October 20, 1990    ADMISSION DATE:  02/16/2015 CONSULTATION DATE:  Mary Garrison  REFERRING MD :  Heide Spark  CHIEF COMPLAINT:  Hypotension   INITIAL PRESENTATION:  24 year old female w/ h/o Hep C. Admitted on 7/1 w/ acute ETOH related hepatitis super-imposed on underlying hep C. On evaluation was found to be hypotensive w/ acute kidney injury. She was seen on consult by both GI and Nephrology. Diuretics stopped, started on midodrine and albumin. PCCM asked to see for on-going hypotension.   STUDIES:    SIGNIFICANT EVENTS:    HISTORY OF PRESENT ILLNESS:  This is a 24 year old female w/ h/o Hep C, felt to be due to prior tattoo. Also w/ h/o ETOH and IVDA. Recently seen in clinic on 6/28 w/ cc increased abd pain and distention. Reported she had been drinking approx 3d prior to onset of this. IT was felt by physical exam that this was likely ascites and she was started on diuretics and scheduled for abd [place] as well as f/u labs and appointment. She was seen in f/u on 7/1 reporting pain at 9/10, more tired, and appeared to have worsening abd distention as well as jaundice. She was not voiding much in spite of the addition of diuretics. It was decided to admit her as she was hypotensive w/ SBP in 70s but weight was up from 118 to 124 lbs. Abd [place] was obtained showing minimal ascites. PCCM was asked to see the pt given on-going boarderline hypotension.  PAST MEDICAL HISTORY :   has a past medical history of Hepatitis C (11/2012); Asthma; Hypophosphatemia (06/29/2014); Hypokalemia (06/26/2014); Alcohol addiction; Polysubstance dependence; C. difficile diarrhea (08/24/2014); and Tachycardia.  has no past surgical history on file. Prior to Admission medications   Medication Sig Start Date End Date Taking? Authorizing Provider  albuterol (PROVENTIL HFA;VENTOLIN HFA) 108 (90 BASE) MCG/ACT inhaler Inhale 2 puffs into the lungs every 6 (six) hours  as needed. For shortness of breath 06/21/14  Yes Josalyn Funches, MD  calcium-vitamin D (OSCAL WITH D) 500-200 MG-UNIT per tablet Take 1 tablet by mouth 2 (two) times daily. 07/26/14  Yes Josalyn Funches, MD  cholecalciferol (VITAMIN D) 1000 UNITS tablet Take 1 tablet (1,000 Units total) by mouth daily. 07/26/14  Yes Josalyn Funches, MD  Cyanocobalamin (B-12 PO) Take 1 tablet by mouth daily.   Yes Historical Provider, MD  Diclofenac Sodium 3 % GEL Place 1 application onto the skin as needed (for muscle pain). 12/27/14  Yes Dessa Phi, MD  folic acid (FOLVITE) 1 MG tablet Take 1 tablet (1 mg total) by mouth daily. 06/28/14  Yes Josalyn Funches, MD  furosemide (LASIX) 40 MG tablet Take 1 tablet (40 mg total) by mouth daily. 02/13/15  Yes Josalyn Funches, MD  ibuprofen (ADVIL,MOTRIN) 200 MG tablet Take 600 mg by mouth every 6 (six) hours as needed for mild pain or moderate pain.    Yes Historical Provider, MD  levonorgestrel-ethinyl estradiol (AVIANE) 0.1-20 MG-MCG tablet Take 1 tablet by mouth daily.   Yes Historical Provider, MD  magnesium oxide (MAG-OX) 400 MG tablet Take 400 mg by mouth daily.   Yes Historical Provider, MD  metoprolol succinate (TOPROL-XL) 25 MG 24 hr tablet Take 25 mg by mouth daily.   Yes Historical Provider, MD  Multiple Vitamins-Minerals (MULTIVITAMIN PO) Take 1 tablet by mouth daily.   Yes Historical Provider, MD  ondansetron (ZOFRAN ODT) 8 MG disintegrating tablet Take 1  tablet (8 mg total) by mouth every 8 (eight) hours as needed for nausea or vomiting. 12/27/14  Yes Josalyn Funches, MD  potassium & sodium phosphates (PHOS-NAK) 280-160-250 MG PACK Take 1 packet by mouth 4 (four) times daily.   Yes Historical Provider, MD  potassium chloride SA (K-DUR,KLOR-CON) 20 MEQ tablet Take 1 tablet (20 mEq total) by mouth daily as needed. 01/17/15  Yes Josalyn Funches, MD  Pyridoxine HCl (B-6 PO) Take 1 tablet by mouth daily.   Yes Historical Provider, MD  ranitidine (ZANTAC) 150 MG tablet  Take 1 tablet (150 mg total) by mouth 2 (two) times daily as needed for heartburn. 10/09/14  Yes Dessa Phi, MD  spironolactone (ALDACTONE) 25 MG tablet Take 1 tablet (25 mg total) by mouth daily. 02/13/15  Yes Josalyn Funches, MD  Thiamine HCl (B-1 PO) Take 1 tablet by mouth daily.   Yes Historical Provider, MD  traMADol (ULTRAM) 50 MG tablet Take 1 tablet (50 mg total) by mouth every 12 (twelve) hours as needed. Patient taking differently: Take 50 mg by mouth every 12 (twelve) hours as needed for moderate pain or severe pain.  02/13/15  Yes Dessa Phi, MD   Allergies  Allergen Reactions  . Penicillins     unknown  . Sulfa Antibiotics     unknown    FAMILY HISTORY:  indicated that her mother is alive. She indicated that her brother is alive.  SOCIAL HISTORY:  reports that she has been smoking Cigarettes.  She has a 5 pack-year smoking history. She has never used smokeless tobacco. She reports that she does not drink alcohol or use illicit drugs.  REVIEW OF SYSTEMS:  GEN: no fever or chills. Over all feels weak and tired. HENT: no HA, nasal congestion, sore throat. Pulm: NO SOB, cough or CP. Card: no CP, or palps. Abd: boated, + discomfort + anorexia. GU: not voiding. Neuro: no focal def; ENDO: no hot or cold intol. No weight changes.   SUBJECTIVE:  No distress.  VITAL SIGNS: Temp:  [97.7 F (36.5 C)-98.8 F (37.1 C)] 97.7 F (36.5 C) (07/01 1518) Pulse Rate:  [72-88] 76 (07/01 1450) Resp:  [13-25] 15 (07/01 1518) BP: (76-102)/(40-61) 84/47 mmHg (07/01 1518) SpO2:  [93 %-98 %] 96 % (07/01 1450) Weight:  [52.164 kg (115 lb)-57.3 kg (126 lb 5.2 oz)] 57.3 kg (126 lb 5.2 oz) (07/01 1450) HEMODYNAMICS:   VENTILATOR SETTINGS:   INTAKE / OUTPUT:  Intake/Output Summary (Last 24 hours) at 02/16/15 2001 Last data filed at 02/16/15 1900  Gross per 24 hour  Intake    870 ml  Output      0 ml  Net    870 ml   Filed Weights   02/16/15 1450 02/16/15 1518  Weight: 57.3 kg (126  lb 5.2 oz) 57.3 kg (126 lb 5.2 oz)   PHYSICAL EXAMINATION: General:  Chronically ill appearing white female, resting comfortably and in no distress.  Neuro:  Awake, oriented. No focal def  HEENT:  Lips are dry and cracked. Neck veins flat. MM are dry . Sclera are icteric  Cardiovascular:  rrr Lungs:  Clear and w/out accessory muscle use  Abdomen:  Distended. Tympanic percussion + bowel sounds  Musculoskeletal:  Intact  Skin:  Intact   LABS:  CBC  Recent Labs Lab 02/13/15 0955 02/15/15 2120 02/16/15 1322  WBC 8.4 7.6 7.4  HGB 10.2* 9.8* 10.9*  HCT 30.3* 28.4* 31.3*  PLT 220 175 152   Coag's  Recent Labs Lab  02/13/15 1009 02/15/15 2120 02/16/15 1322  INR 1.50 1.49 1.44   BMET  Recent Labs Lab 02/13/15 0955 02/15/15 2120 02/16/15 1322  NA 128* 120* 120*  K 3.2* 3.2* 3.3*  CL 84* 79* 78*  CO2 28 25 27   BUN 14 33* 37*  CREATININE 1.38* 4.73* 5.14*  GLUCOSE 83 94 82   Electrolytes  Recent Labs Lab 02/13/15 0955 02/15/15 2120 02/16/15 1322  CALCIUM 7.6* 7.0* 6.8*  MG 1.6  --   --   PHOS 1.4*  --   --    Sepsis Markers No results for input(s): LATICACIDVEN, PROCALCITON, O2SATVEN in the last 168 hours. ABG No results for input(s): PHART, PCO2ART, PO2ART in the last 168 hours. Liver Enzymes  Recent Labs Lab 02/13/15 0955 02/15/15 2120 02/16/15 1322  AST 222* 240* 230*  ALT 58* 67* 69*  ALKPHOS 161* 165* 164*  BILITOT 7.1* 9.5* 9.8*  ALBUMIN 2.7* 2.2* 2.1*   Cardiac Enzymes No results for input(s): TROPONINI, PROBNP in the last 168 hours. Glucose No results for input(s): GLUCAP in the last 168 hours.  Imaging Dg Chest 2 View  02/16/2015   CLINICAL DATA:  24 year old with abdominal pain, nausea and vomiting. History of hepatitis-C and asthma. Ascites.  EXAM: CHEST  2 VIEW  COMPARISON:  None.  FINDINGS: The heart size is at the upper limits of normal, likely exaggerated by a narrow AP diameter of the chest. There is bibasilar atelectasis with  central airway thickening, but no edema or confluent airspace opacity. There is no pleural effusion. The abdomen is distended. No ascites was demonstrated on abdominal ultrasound performed earlier today.  IMPRESSION: Bibasilar atelectasis without focal airspace disease or significant pleural effusion.   Electronically Signed   By: Carey Bullocks M.D.   On: 02/16/2015 12:26   US Abdomen Complete  02/16/2015   CLINICAL DATA:  Hepatitis C, ascites  EXAM: ULTRASOUND ABDOMEN COMPLETE  COMPARISON:  None.  FINDINGS: Gallbladder: No shadowing gallstones are noted within gallbladder. Gallbladder sludge is noted. There is mild edema gallbladder wall measures 4.4 mm thickness. No sonographic Murphy's sign.  Common bile duct: Diameter: 3.3 mm in diameter.  Liver: Hepatomegaly is noted. There is diffuse increased echogenicity of the liver consistent with fatty infiltration or chronic hepatitis.  IVC: No abnormality visualized.  Pancreas: Obscured by overlying bowel gas.  Spleen: Size and appearance within normal limits.  Right Kidney: Length: 13 cm in length. Normal echogenicity. Mild lobulated contour. No hydronephrosis or focal mass.  Left Kidney: Length: 13 cm in length. Echogenicity within normal limits. No mass or hydronephrosis visualized.  Abdominal aorta: No aneurysm.  Measures up to 1.7 cm in diameter.  Other findings: None.  IMPRESSION: 1. No shadowing gallstones are noted within gallbladder. Layering gallbladder sludge. There is mild thickening of gallbladder wall with edematous appearance measures 4.4 mm thickness. No sonographic Murphy's sign. 2. Normal CBD. 3. Diffuse increased echogenicity of the liver consistent with fatty infiltration or chronic liver disease. No focal hepatic mass. 4. Splenomegaly is noted.  Spleen measures 15.6 cm in length.   Electronically Signed   By: Natasha Mead M.D.   On: 02/16/2015 08:58     ASSESSMENT / PLAN:  PULMONARY OETT A: Elevated right hemidiaphragm in setting of  hepatomegaly  Mild hypoxemia in setting of decreased abd compliance and hypoventilation  P:   Pulse ox  O2 as needed   CARDIOVASCULAR CVL A:  Hypovolemia  Lactic acid nml, no evidence of shock  P:  Hydration  Midodrine and albumin  Ok to stay in SDU setting   RENAL A:   Acute renal failure: likely this is ATN from hypotension and NSAIDS >> baseline creatinine 0.54 from 12/27/14 Hyponatremia  Hypokalemia  P:   Hold all antihypertensives and diuretics  Volume expand Midodrine and albumin Will go ahead and increase her MIVFs as well.  Repeat Chemistry this evening   GASTROINTESTINAL A:   Acute alcoholic hepatitis superimposed Hep C >> not clear she has cirrhosis abd pain Possible hepatorenal syndrome  P:   Oral pred for ETOH related hepatitis  Midodrine and albumin for possible hepatorenal syndrome Repeat LFTs NPO except meds   HEMATOLOGIC A:   Anemia of chronic disease-->no evidence of acute bleeding  No current coagulopathy  P:  Trend cbc  Cont [place] heparin for DVT prophylaxis   INFECTIOUS A:   R/o SBP P:   BCx2 7/1>>> UC 7/1>>> Abx: levaquin 7/1>>>  ENDOCRINE A:   No acute issues   P:   Trend fasting glucose   NEUROLOGIC A:   Abd pain: currently reports tolerable   P:   RASS goal: 0 Supportive care Careful w/ narcs given liver disease    FAMILY  - Updates: not completed 7/1  - Inter-disciplinary family meet or Palliative Care meeting due by: 7/8    TODAY'S SUMMARY:  Acute ETOH hepatitis with hx of hepatitis C from IVDA, hypovolemia from outpt diuretic therapy +NSAIDS= AKI. Appears clinically dry, no ascites noted on abd US. Covered for SBP. Doubt this is the cause. Her lactic acid is nml and she is in no distress. Started on albumin, octreotide and midodrine. We have added IVFs. Will f/u on chemistry later this evening. For now no need to move to ICU.   Simonne MartinetPeter E Babcock ACNP-BC [facility] Pulmonary/Critical Care Pager # [phone] OR #  [phone] if no answer  Pager: [phone]  02/16/2015, 6:10 PM    Reviewed above, examined.  24 yo female with hx of Hep C after IVDA, and hx of ETOH was started on aldactone and lasix as outpt.  She also had been taking NSAID for abdominal pain.  She had recently started drinking alcohol again.  She was found to have AKI and hypotension.  Her blood pressure normally is 90 to 100 systolic.  She feels bloated.  She denies nausea, chest pain, dizziness, dyspnea, or headache.  Labs, reviewed.  CXR showed ATX.  Abd u/s showed showed hepatosplenomegaly.  Will increase IV fluid.  It is not clear to me that she actually has cirrhosis.  Defer octreotide, midodrine to renal.  Abx per primary team.  Will get abd xray to assess her abdominal distension.  Her heart sounds are regular, lungs clear, alert, normal strength, no tremor.  Okay to keep in SDU.  Coralyn HellingVineet Haivyn Oravec, MD [facility] Pulmonary/Critical Care 02/16/2015, 9:46 PM Pager:  [phone] After 3pm call: [phone]

## 2015-02-16 NOTE — Progress Notes (Signed)
Evaluated patient's abdomen with bedside ultrasound. No fluid appreciated. Discussed with radiology who reviewed images with us. Also did not identify ascites. Enlarged abdomen most likely due to organomegaly. Small volume ascites not excluded. SBP not able to be excluded at this time.   Plan: Based on abdominal pain and acute increase in creatinine, we will presumptively cover for possible SBP. EKG to check QTc. Levaquin per pharmacy as she has a penicillin allergy.  Griffin BasilJennifer Krall, MD Internal Medicine Teaching Service

## 2015-02-16 NOTE — ED Notes (Signed)
IV Team unable to access. US at the bedside for MD. Patient agreed to let MD attempt IV.

## 2015-02-16 NOTE — Consult Note (Signed)
Mary Garrison is a 24 y.o. Female with a hx of substance abuse, Hep C dxd 11/2012 HCV quat 65465 in Jan 2016, alcoholism and Cdiff diarrhea 08/2014.  Mary Garrison was started on Aldactone and Lasix for clinical appearance of ascites on 6/28 with planned ultrasound.   Seen at primary care 6/30 with progressive abdominal discomfort and jaundice. Mary Garrison has been taking ibuprofen over last week daily for the abdominal pain.  Mary Garrison take 678m BID to TID with meals.  Mary Garrison has had diminished urine output.  Mary Garrison still drink secretly.  Ultrasound shows 13cm kidneys bilat., fatty liver/?chronic liver disease, splenomegaly. There is no ascites. LFTS are abnl with a bilirubin > 9. Mary Garrison reports BPs usually low.  Results for Mary Garrison, Mary Garrison(MRN [id number] as of 02/16/2015 16:35  Ref. Range 08/21/2014 06:19 12/18/2014 19:34 12/19/2014 06:07 12/19/2014 11:27 12/27/2014 12:45 02/13/2015 09:55 02/15/2015 21:20 02/16/2015 13:22  Creatinine Latest Ref Range: 0.44-1.00 mg/dL 0.53 1.18 (H) 0.70 0.76 0.54 1.38 (H) 4.73 (H) 5.14 (H)    Past Medical History  Diagnosis Date  . Hepatitis C 11/2012    Mary Garrison suspects this was from a tattoo as Mary Garrison was dxd with Hep C before starting Mary Garrison IVDA.   .Marland KitchenAsthma     as a child  . Hypophosphatemia 06/29/2014    Phos of 1.4 > 4.0 with supplementation    . Hypokalemia 06/26/2014  . Alcohol addiction   . Polysubstance dependence     including IV heroin. inpt rehab 01/2012.   . C. difficile diarrhea 08/24/2014  . Tachycardia    History reviewed. No pertinent past surgical history. Social History:  reports that Mary Garrison has been smoking Cigarettes.  Mary Garrison has a 5 pack-year smoking history. Mary Garrison has never used smokeless tobacco. Mary Garrison reports that Mary Garrison does not drink alcohol or use illicit drugs. Allergies:  Allergies  Allergen Reactions  . Penicillins     unknown  . Sulfa Antibiotics     unknown   Family History  Problem Relation Age of Onset  . Asthma Mother   . Depression Brother   . Other Brother   . Depression  Sister   . Other Sister     Medications:  Scheduled: . folic acid  1 mg Oral Daily  . heparin  5,000 Units Subcutaneous 3 times per day  . levofloxacin (LEVAQUIN) IV  750 mg Intravenous Once   Followed by  . [START ON 02/18/2015] levofloxacin (LEVAQUIN) IV  500 mg Intravenous Q48H  . multivitamin with minerals  1 tablet Oral Daily  . potassium chloride  20 mEq Oral Once  . prednisoLONE  40 mg Oral Daily  . sodium chloride  3 mL Intravenous Q12H  . thiamine  100 mg Oral Daily   Or  . thiamine  100 mg Intravenous Daily   ROS: as per HPI  Blood pressure 84/47, pulse 76, temperature 97.7 F (36.5 C), temperature source Oral, resp. rate 15, height 5' 5"  (1.651 m), weight 57.3 kg (126 lb 5.2 oz), SpO2 96 %.  General appearance: alert and cooperative Head: Normocephalic, without obvious abnormality, atraumatic Eyes: negative sceral icterus Ears: normal TM's and external ear canals both ears Nose: Nares normal. Septum midline. Mucosa normal. No drainage or sinus tenderness. Throat: lips, mucosa, and tongue normal; teeth and gums normal Resp: clear to auscultation bilaterally Chest wall: no tenderness Cardio: regular rate and rhythm, S1, S2 normal, no murmur, click, rub or gallop GI: huge liver 12cm aprox tender; increase venous pattern on abd wall Extremities: extremities normal,  atraumatic, no cyanosis or edema Skin: spider angiomas on ant chest wall Neurologic: Grossly normal no asterixis Results for orders placed or performed during the hospital encounter of 02/16/15 (from the past 48 hour(s))  Comprehensive metabolic panel     Status: Abnormal   Collection Time: 02/16/15  1:22 PM  Result Value Ref Range   Sodium 120 (L) 135 - 145 mmol/L   Potassium 3.3 (L) 3.5 - 5.1 mmol/L   Chloride 78 (L) 101 - 111 mmol/L   CO2 27 22 - 32 mmol/L   Glucose, Bld 82 65 - 99 mg/dL   BUN 37 (H) 6 - 20 mg/dL   Creatinine, Ser 5.14 (H) 0.44 - 1.00 mg/dL   Calcium 6.8 (L) 8.9 - 10.3 mg/dL   Total  Protein 5.8 (L) 6.5 - 8.1 g/dL   Albumin 2.1 (L) 3.5 - 5.0 g/dL   AST 230 (H) 15 - 41 U/L   ALT 69 (H) 14 - 54 U/L   Alkaline Phosphatase 164 (H) 38 - 126 U/L   Total Bilirubin 9.8 (H) 0.3 - 1.2 mg/dL   GFR calc non Af Amer 11 (L) >60 mL/min   GFR calc Af Amer 12 (L) >60 mL/min    Comment: (NOTE) The eGFR has been calculated using the CKD EPI equation. This calculation has not been validated in all clinical situations. eGFR's persistently <60 mL/min signify possible Chronic Kidney Disease.    Anion gap 15 5 - 15  Lipase, blood     Status: Abnormal   Collection Time: 02/16/15  1:22 PM  Result Value Ref Range   Lipase 21 (L) 22 - 51 U/L  CBC with Differential     Status: Abnormal   Collection Time: 02/16/15  1:22 PM  Result Value Ref Range   WBC 7.4 4.0 - 10.5 K/uL   RBC 3.24 (L) 3.87 - 5.11 MIL/uL   Hemoglobin 10.9 (L) 12.0 - 15.0 g/dL   HCT 31.3 (L) 36.0 - 46.0 %   MCV 96.6 78.0 - 100.0 fL   MCH 33.6 26.0 - 34.0 pg   MCHC 34.8 30.0 - 36.0 g/dL   RDW 18.6 (H) 11.5 - 15.5 %   Platelets 152 150 - 400 K/uL   Neutrophils Relative % 67 43 - 77 %   Neutro Abs 5.0 1.7 - 7.7 K/uL   Lymphocytes Relative 17 12 - 46 %   Lymphs Abs 1.2 0.7 - 4.0 K/uL   Monocytes Relative 15 (H) 3 - 12 %   Monocytes Absolute 1.1 (H) 0.1 - 1.0 K/uL   Eosinophils Relative 1 0 - 5 %   Eosinophils Absolute 0.1 0.0 - 0.7 K/uL   Basophils Relative 0 0 - 1 %   Basophils Absolute 0.0 0.0 - 0.1 K/uL  Protime-INR     Status: Abnormal   Collection Time: 02/16/15  1:22 PM  Result Value Ref Range   Prothrombin Time 17.6 (H) 11.6 - 15.2 seconds   INR 1.44 0.00 - 1.49  Type and screen     Status: None   Collection Time: 02/16/15  1:24 PM  Result Value Ref Range   ABO/RH(D) O POS    Antibody Screen NEG    Sample Expiration 02/19/2015   ABO/Rh     Status: None   Collection Time: 02/16/15  1:24 PM  Result Value Ref Range   ABO/RH(D) O POS   I-Stat beta hCG blood, ED     Status: None   Collection Time:  02/16/15  1:46  PM  Result Value Ref Range   I-stat hCG, quantitative <5.0 <5 mIU/mL   Comment 3            Comment:   GEST. AGE      CONC.  (mIU/mL)   <=1 WEEK        5 - 50     2 WEEKS       50 - 500     3 WEEKS       100 - 10,000     4 WEEKS     1,000 - 30,000        FEMALE AND NON-PREGNANT FEMALE:     LESS THAN 5 mIU/mL   Urinalysis, Routine w reflex microscopic (not at [facility])     Status: Abnormal   Collection Time: 02/16/15  2:20 PM  Result Value Ref Range   Color, Urine ORANGE (A) YELLOW    Comment: BIOCHEMICALS MAY BE AFFECTED BY COLOR   APPearance TURBID (A) CLEAR   Specific Gravity, Urine 1.017 1.005 - 1.030   pH 5.0 5.0 - 8.0   Glucose, UA NEGATIVE NEGATIVE mg/dL   Hgb urine dipstick SMALL (A) NEGATIVE   Bilirubin Urine LARGE (A) NEGATIVE   Ketones, ur 15 (A) NEGATIVE mg/dL   Protein, ur 30 (A) NEGATIVE mg/dL   Urobilinogen, UA 1.0 0.0 - 1.0 mg/dL   Nitrite NEGATIVE NEGATIVE   Leukocytes, UA LARGE (A) NEGATIVE  Urine microscopic-add on     Status: Abnormal   Collection Time: 02/16/15  2:20 PM  Result Value Ref Range   Squamous Epithelial / LPF MANY (A) RARE   WBC, UA 21-50 <3 WBC/hpf   RBC / HPF 3-6 <3 RBC/hpf   Bacteria, UA MANY (A) RARE   Dg Chest 2 View  02/16/2015   CLINICAL DATA:  24 year old with abdominal pain, nausea and vomiting. History of hepatitis-C and asthma. Ascites.  EXAM: CHEST  2 VIEW  COMPARISON:  None.  FINDINGS: The heart size is at the upper limits of normal, likely exaggerated by a narrow AP diameter of the chest. There is bibasilar atelectasis with central airway thickening, but no edema or confluent airspace opacity. There is no pleural effusion. The abdomen is distended. No ascites was demonstrated on abdominal ultrasound performed earlier today.  IMPRESSION: Bibasilar atelectasis without focal airspace disease or significant pleural effusion.   Electronically Signed   By: Richardean Sale M.D.   On: 02/16/2015 12:26   US Abdomen  Complete  02/16/2015   CLINICAL DATA:  Hepatitis C, ascites  EXAM: ULTRASOUND ABDOMEN COMPLETE  COMPARISON:  None.  FINDINGS: Gallbladder: No shadowing gallstones are noted within gallbladder. Gallbladder sludge is noted. There is mild edema gallbladder wall measures 4.4 mm thickness. No sonographic Murphy's sign.  Common bile duct: Diameter: 3.3 mm in diameter.  Liver: Hepatomegaly is noted. There is diffuse increased echogenicity of the liver consistent with fatty infiltration or chronic hepatitis.  IVC: No abnormality visualized.  Pancreas: Obscured by overlying bowel gas.  Spleen: Size and appearance within normal limits.  Right Kidney: Length: 13 cm in length. Normal echogenicity. Mild lobulated contour. No hydronephrosis or focal mass.  Left Kidney: Length: 13 cm in length. Echogenicity within normal limits. No mass or hydronephrosis visualized.  Abdominal aorta: No aneurysm.  Measures up to 1.7 cm in diameter.  Other findings: None.  IMPRESSION: 1. No shadowing gallstones are noted within gallbladder. Layering gallbladder sludge. There is mild thickening of gallbladder wall with edematous appearance measures 4.4 mm thickness.  No sonographic Murphy's sign. 2. Normal CBD. 3. Diffuse increased echogenicity of the liver consistent with fatty infiltration or chronic liver disease. No focal hepatic mass. 4. Splenomegaly is noted.  Spleen measures 15.6 cm in length.   Electronically Signed   By: Lahoma Crocker M.D.   On: 02/16/2015 08:58    Assessment:  1 Acute kidney Injury, likely toxic nephropathy due to NSAIDS, low BP, R/O Hepatorenal syndrome 2 Acute alcoholic hepatitis with liver failure 3 Hyponatremia, prob inappropriate ADH in setting of liver failure 4 Hypokalemia  Plan: 1 Volume expand 2 advised to avoid NSAIDs 3 Midodrine trial 4 Urine electrolytes (Na/Cr) 5 K replace  Haruto Demaria C 02/16/2015, 3:42 PM

## 2015-02-16 NOTE — Addendum Note (Signed)
Addended by: Dessa PhiFUNCHES, Maximilian Tallo on: 02/16/2015 03:31 PM   Modules accepted: Orders

## 2015-02-17 ENCOUNTER — Inpatient Hospital Stay (HOSPITAL_COMMUNITY): Payer: Medicaid Other

## 2015-02-17 DIAGNOSIS — E440 Moderate protein-calorie malnutrition: Secondary | ICD-10-CM | POA: Insufficient documentation

## 2015-02-17 DIAGNOSIS — B182 Chronic viral hepatitis C: Secondary | ICD-10-CM

## 2015-02-17 DIAGNOSIS — Z8719 Personal history of other diseases of the digestive system: Secondary | ICD-10-CM

## 2015-02-17 DIAGNOSIS — E872 Acidosis: Secondary | ICD-10-CM

## 2015-02-17 DIAGNOSIS — K7011 Alcoholic hepatitis with ascites: Secondary | ICD-10-CM

## 2015-02-17 LAB — COMPREHENSIVE METABOLIC PANEL
ALT: 62 U/L — AB (ref 14–54)
ANION GAP: 18 — AB (ref 5–15)
AST: 200 U/L — AB (ref 15–41)
Albumin: 2.4 g/dL — ABNORMAL LOW (ref 3.5–5.0)
Alkaline Phosphatase: 154 U/L — ABNORMAL HIGH (ref 38–126)
BUN: 39 mg/dL — AB (ref 6–20)
CO2: 19 mmol/L — ABNORMAL LOW (ref 22–32)
Calcium: 6.4 mg/dL — CL (ref 8.9–10.3)
Chloride: 82 mmol/L — ABNORMAL LOW (ref 101–111)
Creatinine, Ser: 3.92 mg/dL — ABNORMAL HIGH (ref 0.44–1.00)
GFR calc Af Amer: 17 mL/min — ABNORMAL LOW (ref >60)
GFR calc non Af Amer: 15 mL/min — ABNORMAL LOW (ref >60)
GLUCOSE: 84 mg/dL (ref 65–99)
Potassium: 3.9 mmol/L (ref 3.5–5.1)
Sodium: 119 mmol/L — CL (ref 135–145)
Total Bilirubin: 9.7 mg/dL — ABNORMAL HIGH (ref 0.3–1.2)
Total Protein: 5.7 g/dL — ABNORMAL LOW (ref 6.5–8.1)

## 2015-02-17 LAB — CREATININE, URINE, RANDOM: CREATININE, URINE: 60.68 mg/dL

## 2015-02-17 LAB — PROTIME-INR
INR: 1.72 — ABNORMAL HIGH (ref 0.00–1.49)
Prothrombin Time: 20.1 seconds — ABNORMAL HIGH (ref 11.6–15.2)

## 2015-02-17 LAB — CBC
HCT: 29.3 % — ABNORMAL LOW (ref 36.0–46.0)
Hemoglobin: 10.4 g/dL — ABNORMAL LOW (ref 12.0–15.0)
MCH: 34 pg (ref 26.0–34.0)
MCHC: 35.5 g/dL (ref 30.0–36.0)
MCV: 95.8 fL (ref 78.0–100.0)
Platelets: 159 10*3/uL (ref 150–400)
RBC: 3.06 MIL/uL — AB (ref 3.87–5.11)
RDW: 18.6 % — ABNORMAL HIGH (ref 11.5–15.5)
WBC: 9.4 10*3/uL (ref 4.0–10.5)

## 2015-02-17 LAB — HEPATITIS PANEL, ACUTE
Hep A IgM: NEGATIVE
Hep B C IgM: NEGATIVE
Hepatitis B Surface Ag: NEGATIVE

## 2015-02-17 LAB — SODIUM, URINE, RANDOM: Sodium, Ur: 31 mmol/L

## 2015-02-17 LAB — HIV ANTIBODY (ROUTINE TESTING W REFLEX): HIV SCREEN 4TH GENERATION: NONREACTIVE

## 2015-02-17 NOTE — Progress Notes (Signed)
Subjective: She reports she feels better overall. Abdominal pain improved. Denies N/V.  Objective: Vital signs in last 24 hours: Filed Vitals:   02/17/15 0700 02/17/15 0800 02/17/15 0801 02/17/15 0812  BP: 89/46 106/62    Pulse: 80 85 86   Temp:    98.2 F (36.8 C)  TempSrc:    Oral  Resp: 17 24 17    Height:      Weight:      SpO2: 88% 93% 90%    Weight change:   Intake/Output Summary (Last 24 hours) at 02/17/15 1130 Last data filed at 02/17/15 0900  Gross per 24 hour  Intake   1875 ml  Output   1600 ml  Net    275 ml   General Apperance: NAD HEENT: Normocephalic, atraumatic, PERRL, EOMI, +icteric sclera Neck: Supple, trachea midline Lungs: Clear to auscultation bilaterally. No wheezes, rhonchi or rales. Breathing comfortably Heart: Regular rate and rhythm, no murmur/rub/gallop Abdomen: Soft, minimal tenderness, distended, no rebound/guarding Extremities: Normal, atraumatic, warm and well perfused, no edema Pulses: 2+ throughout Skin: No rashes or lesions, +jaundice Neurologic: Alert and oriented x 3. CNII-XII intact. Normal strength and sensation.  Lab Results: Basic Metabolic Panel:  Recent Labs Lab 02/13/15 0955 02/15/15 2120 02/16/15 1322  NA 128* 120* 120*  K 3.2* 3.2* 3.3*  CL 84* 79* 78*  CO2 28 25 27   GLUCOSE 83 94 82  BUN 14 33* 37*  CREATININE 1.38* 4.73* 5.14*  CALCIUM 7.6* 7.0* 6.8*  MG 1.6  --   --   PHOS 1.4*  --   --    Liver Function Tests:  Recent Labs Lab 02/15/15 2120 02/16/15 1322  AST 240* 230*  ALT 67* 69*  ALKPHOS 165* 164*  BILITOT 9.5* 9.8*  PROT 5.9* 5.8*  ALBUMIN 2.2* 2.1*    Recent Labs Lab 02/16/15 1322 02/16/15 1752  LIPASE 21* 20*    Recent Labs Lab 02/15/15 2120  AMMONIA 64*   CBC:  Recent Labs Lab 02/15/15 2120 02/16/15 1322 02/17/15 0945  WBC 7.6 7.4 9.4  NEUTROABS 5.5 5.0  --   HGB 9.8* 10.9* 10.4*  HCT 28.4* 31.3* 29.3*  MCV 97.6 96.6 95.8  PLT 175 152 159   Coagulation:  Recent  Labs Lab 02/13/15 1009 02/15/15 2120 02/16/15 1322  LABPROT  --  18.1* 17.6*  INR 1.50 1.49 1.44   Anemia Panel: No results for input(s): VITAMINB12, FOLATE, FERRITIN, TIBC, IRON, RETICCTPCT in the last 168 hours.   Urine Drug Screen: Drugs of Abuse     Component Value Date/Time   LABOPIA POSITIVE* 12/19/2014 0100   COCAINSCRNUR NONE DETECTED 12/19/2014 0100   LABBENZ POSITIVE* 12/19/2014 0100   AMPHETMU NONE DETECTED 12/19/2014 0100   THCU NONE DETECTED 12/19/2014 0100   LABBARB NONE DETECTED 12/19/2014 0100    Alcohol Level:  Recent Labs Lab 02/15/15 2120  ETH <5   Urinalysis:  Recent Labs Lab 02/16/15 1420  COLORURINE ORANGE*  LABSPEC 1.017  PHURINE 5.0  GLUCOSEU NEGATIVE  HGBUR SMALL*  BILIRUBINUR LARGE*  KETONESUR 15*  PROTEINUR 30*  UROBILINOGEN 1.0  NITRITE NEGATIVE  LEUKOCYTESUR LARGE*    Micro Results: Recent Results (from the past 240 hour(s))  MRSA PCR Screening     Status: None   Collection Time: 02/16/15  3:30 PM  Result Value Ref Range Status   MRSA by PCR NEGATIVE NEGATIVE Final    Comment:        The GeneXpert MRSA Assay (FDA approved for NASAL  specimens only), is one component of a comprehensive MRSA colonization surveillance program. It is not intended to diagnose MRSA infection nor to guide or monitor treatment for MRSA infections.    Studies/Results: Ct Abdomen Pelvis Wo Contrast  02/17/2015   CLINICAL DATA:  Abdominal pain, distension, cirrhosis.  EXAM: CT ABDOMEN AND PELVIS WITHOUT CONTRAST  TECHNIQUE: Multidetector CT imaging of the abdomen and pelvis was performed following the standard protocol without IV contrast.  COMPARISON:  None.  FINDINGS: The liver is markedly enlarged and fatty infiltrated. No focal liver masses are evident. There is mild splenomegaly. There are mildly prominent upper abdominal vascular structures which may represent varices. There is small volume ascites collected dependently. There is mild  gallbladder mural thickening, but this can be seen in the presence of ascites and particularly if there is portal hypertension.  The pancreas and adrenals are grossly normal in appearance. Both kidneys are negative for hydronephrosis. No urinary calculi are evident.  Stomach is unremarkable.  There is slightly prominent caliber of mid abdominal small bowel without abrupt caliber transition. This is likely nonobstructive.  There is no extraluminal air.  Uterus and adnexal structures appear grossly unremarkable.  No acute inflammatory changes are evident in the abdomen or pelvis.  Multifocal patchy airspace opacities are present in both lung bases, likely infectious infiltrate. Aspiration cannot be excluded. There are trace pleural effusions bilaterally.  IMPRESSION: 1. Markedly enlarged liver without focal mass. 2. Findings suggesting portal hypertension, with splenomegaly and mildly prominent upper abdominal vascular structures which may represent varices. 3. Small volume ascites. 4. Nonspecific mild gallbladder mural thickening, which can be seen in the presence of ascites and/or portal hypertension. 5. Small bowel is slightly prominent but appears nonobstructed. 6. No focal inflammatory changes. 7. Multifocal lung base infiltrates, probably infectious. Aspiration cannot be excluded.   Electronically Signed   By: Ellery Plunk M.D.   On: 02/17/2015 05:13   Dg Chest 2 View  02/16/2015   CLINICAL DATA:  24 year old with abdominal pain, nausea and vomiting. History of hepatitis-C and asthma. Ascites.  EXAM: CHEST  2 VIEW  COMPARISON:  None.  FINDINGS: The heart size is at the upper limits of normal, likely exaggerated by a narrow AP diameter of the chest. There is bibasilar atelectasis with central airway thickening, but no edema or confluent airspace opacity. There is no pleural effusion. The abdomen is distended. No ascites was demonstrated on abdominal ultrasound performed earlier today.  IMPRESSION:  Bibasilar atelectasis without focal airspace disease or significant pleural effusion.   Electronically Signed   By: Carey Bullocks M.D.   On: 02/16/2015 12:26   US Abdomen Complete  02/16/2015   CLINICAL DATA:  Hepatitis C, ascites  EXAM: ULTRASOUND ABDOMEN COMPLETE  COMPARISON:  None.  FINDINGS: Gallbladder: No shadowing gallstones are noted within gallbladder. Gallbladder sludge is noted. There is mild edema gallbladder wall measures 4.4 mm thickness. No sonographic Murphy's sign.  Common bile duct: Diameter: 3.3 mm in diameter.  Liver: Hepatomegaly is noted. There is diffuse increased echogenicity of the liver consistent with fatty infiltration or chronic hepatitis.  IVC: No abnormality visualized.  Pancreas: Obscured by overlying bowel gas.  Spleen: Size and appearance within normal limits.  Right Kidney: Length: 13 cm in length. Normal echogenicity. Mild lobulated contour. No hydronephrosis or focal mass.  Left Kidney: Length: 13 cm in length. Echogenicity within normal limits. No mass or hydronephrosis visualized.  Abdominal aorta: No aneurysm.  Measures up to 1.7 cm in diameter.  Other findings:  None.  IMPRESSION: 1. No shadowing gallstones are noted within gallbladder. Layering gallbladder sludge. There is mild thickening of gallbladder wall with edematous appearance measures 4.4 mm thickness. No sonographic Murphy's sign. 2. Normal CBD. 3. Diffuse increased echogenicity of the liver consistent with fatty infiltration or chronic liver disease. No focal hepatic mass. 4. Splenomegaly is noted.  Spleen measures 15.6 cm in length.   Electronically Signed   By: Natasha Mead M.D.   On: 02/16/2015 08:58   Dg Abd Portable 1v  02/16/2015   CLINICAL DATA:  Abdominal distention  EXAM: PORTABLE ABDOMEN - 1 VIEW  COMPARISON:  None.  FINDINGS: There is abnormal dilatation of small bowel in the mid abdomen. Some stool and air is present throughout the colon. No free air is evident on this single supine portable image.  No biliary or urinary calculi are evident.  IMPRESSION: Abnormal dilatation of small bowel. This could be a partial or early obstruction. Reactive small bowel dilatation would also be a possibility.   Electronically Signed   By: Ellery Plunk M.D.   On: 02/16/2015 22:21   Medications: I have reviewed the patient's current medications. Scheduled Meds: . folic acid  1 mg Oral Daily  . heparin  5,000 Units Subcutaneous 3 times per day  . midodrine  7.5 mg Oral TID WC  . multivitamin with minerals  1 tablet Oral Daily  . sodium chloride  3 mL Intravenous Q12H  . thiamine  100 mg Oral Daily   Or  . thiamine  100 mg Intravenous Daily   Continuous Infusions: . sodium chloride 75 mL/hr at 02/17/15 0700   PRN Meds:.LORazepam **OR** LORazepam, ondansetron **OR** ondansetron (ZOFRAN) IV Assessment/Plan:  Acute alcoholic hepatitis: History of heavy alcohol use and AST:ALT ratio of ?2. Also has a history of hepatitis C. PT 20.1 and bilirubin 9.7 - discriminant function of 32.2. Tylenol negative. Acute hepatitis panel with >11 HCV ab, negative hep b, hep a. Abd XR ordered by PCCM with abnormal dilation of small bowel. CT abdomen/pelvis with markedly enlarged liver without focal mass, splenomegaly, midly prominent upper abdominal vascular structures, small volume ascites, nonobstructed small bowel.  -GI following, appreciate recommendations -will hold off on prednisolone 40 mg daily for now  AKI: Multifactorial. She was on furosemide for ascites prior to admission and was also taking a lot of ibuprofen as well. May have hepatorenal syndrome. Evaluated by PCCM as she had hypotension - did not require levophed last night as her pressure responded to IV fluid boluses. Cr on admission 5.14. Initially started on albumin, octreotide and midodrine. Also received IV fluids last night. Improved cr to 3.92 this morning. Unlikely to have SBP. Blood cultures NGTD. UA with many squamous epithelial cells, large  leukocytes, negative nitrite, 30 protein. FENa 1.7%.   -Discontinue IV levofloxacin -Continue midodrine -Discontinue octreotide, and albumin -Continue NS@75ml /hr   Hyponatremia: likely 2/2 cirrhosis. May also be related to her renal failure. -Continue NS@75ml /hr   Hypokalemia: resolved  Metabolic acidosis with anion gap: Likely 2/2 AKI -Continue to monitor  Normocytic anemia: Hgb 10.9 on admission. Baseline is around 11. Hgb stable. -Continue to monitor  Alcohol abuse: CIWA protocol  FEN: Renal  VTE ppx: SQ Heparin  Dispo: Disposition is deferred at this time, awaiting improvement of current medical problems.  Anticipated discharge in approximately 1-2 day(s).   The patient does have a current PCP (Dessa Phi, MD) and does not need an [facility] hospital follow-up appointment after discharge.  The patient does not have  transportation limitations that hinder transportation to clinic appointments.  .Services Needed at time of discharge: Y = Yes, Blank = No PT:   OT:   RN:   Equipment:   Other:     LOS: 1 day   Lora Paula, MD 02/17/2015, 11:30 AM

## 2015-02-17 NOTE — Progress Notes (Signed)
Initial Nutrition Assessment  DOCUMENTATION CODES:  Non-severe (moderate) malnutrition in context of acute illness/injury, Non-severe (moderate) malnutrition in context of social or environmental circumstances  INTERVENTION: - Will monitor for needs with diet advancement  NUTRITION DIAGNOSIS:  Inadequate oral intake related to inability to eat as evidenced by NPO status.  GOAL:  Patient will meet greater than or equal to 90% of their needs  MONITOR:  Diet advancement, Weight trends, Labs, I & O's  REASON FOR ASSESSMENT:  Malnutrition Screening Tool  ASSESSMENT: 24 y/o female with PMH of hepatitis C, ETOH abuse, heroin abuse who p/w worsening abd pain * 1 week. Patient states that approx 1 week ago she developed epigastric pain - contraction like, non radiating, worsened with eating, assoc with intermittent nausea and vomiting and abd distension. She noted darkening urine. No hematuria, no hematemesis, no CP, no palpitations, no diarrhea. Patient also complains of pruritis, subjective fevers and mild SOB. Patient also states she started drinking ETOH again 3 days before her symptoms started.   Pt seen for MST. BMI indicates normal weight status. Pt reports weight gain with abdominal distention and that UBW is 115 lbs. She has been NPO since admission and unable to meet needs. She indicates that for 1 week PTA she had decreased appetite and was mainly snacking on soft, warm foods such as macaroni. She states nausea PTA and she is currently experiencing some pressure on one side of abdomen.  Notes indicate chronic Hepatitis C with liver failure and possible SBO. Will monitor for diet advancement versus need for nutrition support.  Unable to meet needs at this time. Physical assessment indicates moderate muscle and very mild fat wasting. Medications reviewed. Labs reviewed.  Height:  Ht Readings from Last 1 Encounters:  02/16/15 5\' 5"  (1.651 m)    Weight:  Wt Readings from Last  1 Encounters:  02/16/15 126 lb 5.2 oz (57.3 kg)    Ideal Body Weight:  56.8 kg (kg)  Wt Readings from Last 10 Encounters:  02/16/15 126 lb 5.2 oz (57.3 kg)  02/16/15 124 lb (56.246 kg)  02/15/15 115 lb (52.164 kg)  02/13/15 118 lb (53.524 kg)  12/27/14 109 lb (49.442 kg)  12/18/14 113 lb 1.5 oz (51.3 kg)  12/08/14 110 lb (49.896 kg)  12/06/14 115 lb (52.164 kg)  10/11/14 116 lb (52.617 kg)  10/09/14 112 lb (50.803 kg)    BMI:  Body mass index is 21.02 kg/(m^2).  Estimated Nutritional Needs:  Kcal:  1450-1650  Protein:  65-80 grams  Fluid:  2.2 L/day  Skin:  Reviewed, no issues  Diet Order:  Diet NPO time specified  EDUCATION NEEDS:      Intake/Output Summary (Last 24 hours) at 02/17/15 0915 Last data filed at 02/17/15 16100812  Gross per 24 hour  Intake   1875 ml  Output   1600 ml  Net    275 ml    Last BM:  PTA    Trenton GammonJessica Scott Garrison, RD, LDN Inpatient Clinical Dietitian Pager # [phone] After hours/weekend pager # [phone]

## 2015-02-17 NOTE — Progress Notes (Signed)
hemodynimcally improved/ agree with management by primary svc = teaching service so defer to Teaching service when/ if further PCCM help needed.    Sandrea HughsMichael Wert, MD Pulmonary and Critical Care Medicine  Healthcare Cell [phone] After 5:30 PM or weekends, call [phone]

## 2015-02-17 NOTE — Progress Notes (Signed)
Lab unsuccessful in obtaining am labs, 3 phlebotomists attempted.  Dr. Ladona Ridgelaylor notified.  No new orders given.

## 2015-02-17 NOTE — Progress Notes (Signed)
Subjective: Feeling better.  No acute events.  Objective: Vital signs in last 24 hours: Temp:  [97.7 F (36.5 C)-98.8 F (37.1 C)] 98.2 F (36.8 C) (07/02 0812) Pulse Rate:  [72-84] 80 (07/02 0700) Resp:  [13-25] 17 (07/02 0700) BP: (75-104)/(38-59) 89/46 mmHg (07/02 0700) SpO2:  [88 %-99 %] 88 % (07/02 0700) Weight:  [56.246 kg (124 lb)-57.3 kg (126 lb 5.2 oz)] 57.3 kg (126 lb 5.2 oz) (07/01 1518) Last BM Date: 02/16/15  Intake/Output from previous day: 07/01 0701 - 07/02 0700 In: 1875 [P.O.:940; I.V.:85; IV Piggyback:850] Out: 1450 [Urine:1450] Intake/Output this shift: Total I/O In: -  Out: 150 [Urine:150]  General appearance: alert and no distress GI: soft, distended, nontender, tympanic  Lab Results:  Recent Labs  02/15/15 2120 02/16/15 1322  WBC 7.6 7.4  HGB 9.8* 10.9*  HCT 28.4* 31.3*  PLT 175 152   BMET  Recent Labs  02/15/15 2120 02/16/15 1322  NA 120* 120*  K 3.2* 3.3*  CL 79* 78*  CO2 25 27  GLUCOSE 94 82  BUN 33* 37*  CREATININE 4.73* 5.14*  CALCIUM 7.0* 6.8*   LFT  Recent Labs  02/16/15 1322  PROT 5.8*  ALBUMIN 2.1*  AST 230*  ALT 69*  ALKPHOS 164*  BILITOT 9.8*   PT/INR  Recent Labs  02/15/15 2120 02/16/15 1322  LABPROT 18.1* 17.6*  INR 1.49 1.44   Hepatitis Panel  Recent Labs  02/16/15 1600  HEPBSAG Negative  HCVAB >11.0*  HEPAIGM Negative  HEPBIGM Negative   C-Diff No results for input(s): CDIFFTOX in the last 72 hours. Fecal Lactopherrin No results for input(s): FECLLACTOFRN in the last 72 hours.  Studies/Results: Ct Abdomen Pelvis Wo Contrast  02/17/2015   CLINICAL DATA:  Abdominal pain, distension, cirrhosis.  EXAM: CT ABDOMEN AND PELVIS WITHOUT CONTRAST  TECHNIQUE: Multidetector CT imaging of the abdomen and pelvis was performed following the standard protocol without IV contrast.  COMPARISON:  None.  FINDINGS: The liver is markedly enlarged and fatty infiltrated. No focal liver masses are evident. There is  mild splenomegaly. There are mildly prominent upper abdominal vascular structures which may represent varices. There is small volume ascites collected dependently. There is mild gallbladder mural thickening, but this can be seen in the presence of ascites and particularly if there is portal hypertension.  The pancreas and adrenals are grossly normal in appearance. Both kidneys are negative for hydronephrosis. No urinary calculi are evident.  Stomach is unremarkable.  There is slightly prominent caliber of mid abdominal small bowel without abrupt caliber transition. This is likely nonobstructive.  There is no extraluminal air.  Uterus and adnexal structures appear grossly unremarkable.  No acute inflammatory changes are evident in the abdomen or pelvis.  Multifocal patchy airspace opacities are present in both lung bases, likely infectious infiltrate. Aspiration cannot be excluded. There are trace pleural effusions bilaterally.  IMPRESSION: 1. Markedly enlarged liver without focal mass. 2. Findings suggesting portal hypertension, with splenomegaly and mildly prominent upper abdominal vascular structures which may represent varices. 3. Small volume ascites. 4. Nonspecific mild gallbladder mural thickening, which can be seen in the presence of ascites and/or portal hypertension. 5. Small bowel is slightly prominent but appears nonobstructed. 6. No focal inflammatory changes. 7. Multifocal lung base infiltrates, probably infectious. Aspiration cannot be excluded.   Electronically Signed   By: Ellery Plunk M.D.   On: 02/17/2015 05:13   Dg Chest 2 View  02/16/2015   CLINICAL DATA:  24 year old with abdominal pain, nausea  and vomiting. History of hepatitis-C and asthma. Ascites.  EXAM: CHEST  2 VIEW  COMPARISON:  None.  FINDINGS: The heart size is at the upper limits of normal, likely exaggerated by a narrow AP diameter of the chest. There is bibasilar atelectasis with central airway thickening, but no edema or  confluent airspace opacity. There is no pleural effusion. The abdomen is distended. No ascites was demonstrated on abdominal ultrasound performed earlier today.  IMPRESSION: Bibasilar atelectasis without focal airspace disease or significant pleural effusion.   Electronically Signed   By: Carey Bullocks M.D.   On: 02/16/2015 12:26   US Abdomen Complete  02/16/2015   CLINICAL DATA:  Hepatitis C, ascites  EXAM: ULTRASOUND ABDOMEN COMPLETE  COMPARISON:  None.  FINDINGS: Gallbladder: No shadowing gallstones are noted within gallbladder. Gallbladder sludge is noted. There is mild edema gallbladder wall measures 4.4 mm thickness. No sonographic Murphy's sign.  Common bile duct: Diameter: 3.3 mm in diameter.  Liver: Hepatomegaly is noted. There is diffuse increased echogenicity of the liver consistent with fatty infiltration or chronic hepatitis.  IVC: No abnormality visualized.  Pancreas: Obscured by overlying bowel gas.  Spleen: Size and appearance within normal limits.  Right Kidney: Length: 13 cm in length. Normal echogenicity. Mild lobulated contour. No hydronephrosis or focal mass.  Left Kidney: Length: 13 cm in length. Echogenicity within normal limits. No mass or hydronephrosis visualized.  Abdominal aorta: No aneurysm.  Measures up to 1.7 cm in diameter.  Other findings: None.  IMPRESSION: 1. No shadowing gallstones are noted within gallbladder. Layering gallbladder sludge. There is mild thickening of gallbladder wall with edematous appearance measures 4.4 mm thickness. No sonographic Murphy's sign. 2. Normal CBD. 3. Diffuse increased echogenicity of the liver consistent with fatty infiltration or chronic liver disease. No focal hepatic mass. 4. Splenomegaly is noted.  Spleen measures 15.6 cm in length.   Electronically Signed   By: Natasha Mead M.D.   On: 02/16/2015 08:58   Dg Abd Portable 1v  02/16/2015   CLINICAL DATA:  Abdominal distention  EXAM: PORTABLE ABDOMEN - 1 VIEW  COMPARISON:  None.  FINDINGS:  There is abnormal dilatation of small bowel in the mid abdomen. Some stool and air is present throughout the colon. No free air is evident on this single supine portable image. No biliary or urinary calculi are evident.  IMPRESSION: Abnormal dilatation of small bowel. This could be a partial or early obstruction. Reactive small bowel dilatation would also be a possibility.   Electronically Signed   By: Ellery Plunk M.D.   On: 02/16/2015 22:21    Medications:  Scheduled: . folic acid  1 mg Oral Daily  . heparin  5,000 Units Subcutaneous 3 times per day  . [START ON 02/18/2015] levofloxacin (LEVAQUIN) IV  500 mg Intravenous Q48H  . midodrine  7.5 mg Oral TID WC  . multivitamin with minerals  1 tablet Oral Daily  . octreotide  100 mcg Subcutaneous TID  . sodium chloride  3 mL Intravenous Q12H  . thiamine  100 mg Oral Daily   Or  . thiamine  100 mg Intravenous Daily   Continuous: . sodium chloride 75 mL (02/16/15 2015)    Assessment/Plan: 1) ETOH hepatitis. 2) ? HRS. 3) ETOH abuse. 4) HCV.   ETOH hepatitis can result in findings consistent with decompensated cirrhosis.  50% of ETOH hepatitis cases will progress to cirrhosis.   I personally believe she will have a poor prognosis in the longer term as she  has addition issues with ETOH.  She can still have cirrhosis within her current fatty liver, but it is difficult to discern.  She was started on prednisolone, but she does not meet the criteria at this time or at the time of admission.  This is using the upper limit of normal with the laboratory PT calculations.  Plan: 1) Discontinue prednisolone. 2) Monitor for signs/symptoms of ETOH withdrawal. 3) Supportive care with good nutrition. 4) ? HRS to be managed by Renal.  LOS: 1 day   Mary Garrison D 02/17/2015, 8:43 AM

## 2015-02-17 NOTE — Progress Notes (Signed)
IM has asked CCM to see as well. Too many "cooks". Now receiving Octreotide. This is not HRS. I will sign off. Alsie Younes C

## 2015-02-18 LAB — COMPREHENSIVE METABOLIC PANEL
ALBUMIN: 2.3 g/dL — AB (ref 3.5–5.0)
ALT: 58 U/L — ABNORMAL HIGH (ref 14–54)
AST: 167 U/L — ABNORMAL HIGH (ref 15–41)
Alkaline Phosphatase: 146 U/L — ABNORMAL HIGH (ref 38–126)
Anion gap: 14 (ref 5–15)
BUN: 37 mg/dL — ABNORMAL HIGH (ref 6–20)
CALCIUM: 6.6 mg/dL — AB (ref 8.9–10.3)
CO2: 22 mmol/L (ref 22–32)
CREATININE: 2.49 mg/dL — AB (ref 0.44–1.00)
Chloride: 88 mmol/L — ABNORMAL LOW (ref 101–111)
GFR calc Af Amer: 30 mL/min — ABNORMAL LOW (ref >60)
GFR calc non Af Amer: 26 mL/min — ABNORMAL LOW (ref >60)
GLUCOSE: 100 mg/dL — AB (ref 65–99)
Potassium: 3.2 mmol/L — ABNORMAL LOW (ref 3.5–5.1)
Sodium: 124 mmol/L — ABNORMAL LOW (ref 135–145)
Total Bilirubin: 9.9 mg/dL — ABNORMAL HIGH (ref 0.3–1.2)
Total Protein: 5.6 g/dL — ABNORMAL LOW (ref 6.5–8.1)

## 2015-02-18 LAB — CBC
HCT: 27 % — ABNORMAL LOW (ref 36.0–46.0)
Hemoglobin: 9.4 g/dL — ABNORMAL LOW (ref 12.0–15.0)
MCH: 33.2 pg (ref 26.0–34.0)
MCHC: 34.8 g/dL (ref 30.0–36.0)
MCV: 95.4 fL (ref 78.0–100.0)
Platelets: 259 10*3/uL (ref 150–400)
RBC: 2.83 MIL/uL — ABNORMAL LOW (ref 3.87–5.11)
RDW: 18.8 % — ABNORMAL HIGH (ref 11.5–15.5)
WBC: 12 10*3/uL — AB (ref 4.0–10.5)

## 2015-02-18 LAB — MAGNESIUM: Magnesium: 1.9 mg/dL (ref 1.7–2.4)

## 2015-02-18 LAB — PROTIME-INR
INR: 1.69 — AB (ref 0.00–1.49)
PROTHROMBIN TIME: 19.9 s — AB (ref 11.6–15.2)

## 2015-02-18 LAB — PHOSPHORUS: PHOSPHORUS: 3.2 mg/dL (ref 2.5–4.6)

## 2015-02-18 MED ORDER — OXYCODONE HCL 5 MG PO TABS
5.0000 mg | ORAL_TABLET | Freq: Once | ORAL | Status: AC
  Administered 2015-02-18: 5 mg via ORAL
  Filled 2015-02-18: qty 1

## 2015-02-18 MED ORDER — ENOXAPARIN SODIUM 40 MG/0.4ML ~~LOC~~ SOLN
40.0000 mg | SUBCUTANEOUS | Status: DC
  Administered 2015-02-18: 40 mg via SUBCUTANEOUS
  Filled 2015-02-18 (×4): qty 0.4

## 2015-02-18 MED ORDER — POTASSIUM CHLORIDE CRYS ER 20 MEQ PO TBCR
40.0000 meq | EXTENDED_RELEASE_TABLET | Freq: Once | ORAL | Status: AC
  Administered 2015-02-18: 40 meq via ORAL
  Filled 2015-02-18: qty 2

## 2015-02-18 NOTE — Progress Notes (Signed)
Subjective: Feeling better.    Objective: Vital signs in last 24 hours: Temp:  [97.7 F (36.5 C)-98.7 F (37.1 C)] 98.4 F (36.9 C) (07/03 0746) Pulse Rate:  [74-92] 82 (07/03 0747) Resp:  [14-23] 23 (07/03 0747) BP: (92-106)/(49-65) 106/61 mmHg (07/03 0746) SpO2:  [85 %-94 %] 94 % (07/03 0746) Weight:  [59.8 kg (131 lb 13.4 oz)] 59.8 kg (131 lb 13.4 oz) (07/03 0422) Last BM Date: 02/16/15  Intake/Output from previous day: 07/02 0701 - 07/03 0700 In: 1965 [P.O.:840; I.V.:1125] Out: 1300 [Urine:1300] Intake/Output this shift:    General appearance: alert and no distress GI: distended, less tympanic, hepatomegaly - liver maring is 10 cm BCM  Lab Results:  Recent Labs  02/16/15 1322 02/17/15 0945 02/18/15 0230  WBC 7.4 9.4 12.0*  HGB 10.9* 10.4* 9.4*  HCT 31.3* 29.3* 27.0*  PLT 152 159 259   BMET  Recent Labs  02/16/15 1322 02/17/15 0945 02/18/15 0230  NA 120* 119* 124*  K 3.3* 3.9 3.2*  CL 78* 82* 88*  CO2 27 19* 22  GLUCOSE 82 84 100*  BUN 37* 39* 37*  CREATININE 5.14* 3.92* 2.49*  CALCIUM 6.8* 6.4* 6.6*   LFT  Recent Labs  02/18/15 0230  PROT 5.6*  ALBUMIN 2.3*  AST 167*  ALT 58*  ALKPHOS 146*  BILITOT 9.9*   PT/INR  Recent Labs  02/17/15 0945 02/18/15 0230  LABPROT 20.1* 19.9*  INR 1.72* 1.69*   Hepatitis Panel  Recent Labs  02/16/15 1600  HEPBSAG Negative  HCVAB >11.0*  HEPAIGM Negative  HEPBIGM Negative   C-Diff No results for input(s): CDIFFTOX in the last 72 hours. Fecal Lactopherrin No results for input(s): FECLLACTOFRN in the last 72 hours.  Studies/Results: Ct Abdomen Pelvis Wo Contrast  02/17/2015   CLINICAL DATA:  Abdominal pain, distension, cirrhosis.  EXAM: CT ABDOMEN AND PELVIS WITHOUT CONTRAST  TECHNIQUE: Multidetector CT imaging of the abdomen and pelvis was performed following the standard protocol without IV contrast.  COMPARISON:  None.  FINDINGS: The liver is markedly enlarged and fatty infiltrated. No focal  liver masses are evident. There is mild splenomegaly. There are mildly prominent upper abdominal vascular structures which may represent varices. There is small volume ascites collected dependently. There is mild gallbladder mural thickening, but this can be seen in the presence of ascites and particularly if there is portal hypertension.  The pancreas and adrenals are grossly normal in appearance. Both kidneys are negative for hydronephrosis. No urinary calculi are evident.  Stomach is unremarkable.  There is slightly prominent caliber of mid abdominal small bowel without abrupt caliber transition. This is likely nonobstructive.  There is no extraluminal air.  Uterus and adnexal structures appear grossly unremarkable.  No acute inflammatory changes are evident in the abdomen or pelvis.  Multifocal patchy airspace opacities are present in both lung bases, likely infectious infiltrate. Aspiration cannot be excluded. There are trace pleural effusions bilaterally.  IMPRESSION: 1. Markedly enlarged liver without focal mass. 2. Findings suggesting portal hypertension, with splenomegaly and mildly prominent upper abdominal vascular structures which may represent varices. 3. Small volume ascites. 4. Nonspecific mild gallbladder mural thickening, which can be seen in the presence of ascites and/or portal hypertension. 5. Small bowel is slightly prominent but appears nonobstructed. 6. No focal inflammatory changes. 7. Multifocal lung base infiltrates, probably infectious. Aspiration cannot be excluded.   Electronically Signed   By: Ellery Plunkaniel R Mitchell M.D.   On: 02/17/2015 05:13   Dg Chest 2 View  02/16/2015  CLINICAL DATA:  24 year old with abdominal pain, nausea and vomiting. History of hepatitis-C and asthma. Ascites.  EXAM: CHEST  2 VIEW  COMPARISON:  None.  FINDINGS: The heart size is at the upper limits of normal, likely exaggerated by a narrow AP diameter of the chest. There is bibasilar atelectasis with central  airway thickening, but no edema or confluent airspace opacity. There is no pleural effusion. The abdomen is distended. No ascites was demonstrated on abdominal ultrasound performed earlier today.  IMPRESSION: Bibasilar atelectasis without focal airspace disease or significant pleural effusion.   Electronically Signed   By: Carey Bullocks M.D.   On: 02/16/2015 12:26   US Abdomen Complete  02/16/2015   CLINICAL DATA:  Hepatitis C, ascites  EXAM: ULTRASOUND ABDOMEN COMPLETE  COMPARISON:  None.  FINDINGS: Gallbladder: No shadowing gallstones are noted within gallbladder. Gallbladder sludge is noted. There is mild edema gallbladder wall measures 4.4 mm thickness. No sonographic Murphy's sign.  Common bile duct: Diameter: 3.3 mm in diameter.  Liver: Hepatomegaly is noted. There is diffuse increased echogenicity of the liver consistent with fatty infiltration or chronic hepatitis.  IVC: No abnormality visualized.  Pancreas: Obscured by overlying bowel gas.  Spleen: Size and appearance within normal limits.  Right Kidney: Length: 13 cm in length. Normal echogenicity. Mild lobulated contour. No hydronephrosis or focal mass.  Left Kidney: Length: 13 cm in length. Echogenicity within normal limits. No mass or hydronephrosis visualized.  Abdominal aorta: No aneurysm.  Measures up to 1.7 cm in diameter.  Other findings: None.  IMPRESSION: 1. No shadowing gallstones are noted within gallbladder. Layering gallbladder sludge. There is mild thickening of gallbladder wall with edematous appearance measures 4.4 mm thickness. No sonographic Murphy's sign. 2. Normal CBD. 3. Diffuse increased echogenicity of the liver consistent with fatty infiltration or chronic liver disease. No focal hepatic mass. 4. Splenomegaly is noted.  Spleen measures 15.6 cm in length.   Electronically Signed   By: Natasha Mead M.D.   On: 02/16/2015 08:58   Dg Abd Portable 1v  02/16/2015   CLINICAL DATA:  Abdominal distention  EXAM: PORTABLE ABDOMEN - 1 VIEW   COMPARISON:  None.  FINDINGS: There is abnormal dilatation of small bowel in the mid abdomen. Some stool and air is present throughout the colon. No free air is evident on this single supine portable image. No biliary or urinary calculi are evident.  IMPRESSION: Abnormal dilatation of small bowel. This could be a partial or early obstruction. Reactive small bowel dilatation would also be a possibility.   Electronically Signed   By: Ellery Plunk M.D.   On: 02/16/2015 22:21    Medications:  Scheduled: . enoxaparin (LOVENOX) injection  40 mg Subcutaneous Q24H  . folic acid  1 mg Oral Daily  . midodrine  7.5 mg Oral TID WC  . multivitamin with minerals  1 tablet Oral Daily  . sodium chloride  3 mL Intravenous Q12H  . thiamine  100 mg Oral Daily   Continuous:   Assessment/Plan: 1) ETOH hepatitis. 2) Malnutrition. 3) Renal insufficiency. 4) Hyponatremia - improving. 5) HCV.   The patient continues to improve.  No evidence of ETOH withdrawal.    Plan: 1) Okay with transfer out of the unit. 2) Continue with supportive care.      LOS: 2 days   Avilene Marrin D 02/18/2015, 8:14 AM

## 2015-02-18 NOTE — Progress Notes (Signed)
Report called to Inetta Fermoina, RN, all questions answered.  Patient and grandmother aware of transfer.  Patient without complaints.

## 2015-02-18 NOTE — Progress Notes (Signed)
Subjective: Improved this AM, still having abdominal pain, is eating and drinking okay, picky about food. BP stable, no overnight events. Lost IV access.  Objective: Vital signs in last 24 hours: Filed Vitals:   02/17/15 1640 02/17/15 1956 02/17/15 2335 02/18/15 0422  BP: 105/60 92/58 93/65  92/49  Pulse: 75 74 87 92  Temp:  97.7 F (36.5 C) 97.9 F (36.6 C) 98.4 F (36.9 C)  TempSrc:  Oral Oral Oral  Resp: Height:      Weight:    131 lb 13.4 oz (59.8 kg)  SpO2: 87% 92% 94% 92%   Weight change: 5 lb 8.2 oz (2.5 kg)  Intake/Output Summary (Last 24 hours) at 02/18/15 0735 Last data filed at 02/17/15 2200  Gross per 24 hour  Intake   1965 ml  Output   1300 ml  Net    665 ml   Physical Exam: General: White female, alert, cooperative, NAD. HEENT: PERRL, EOMI. Moist mucus membranes. Icterus.  Neck: Full range of motion without pain, supple, no lymphadenopathy or carotid bruits Lungs: Clear to ascultation bilaterally, normal work of respiration, no wheezes, rales, rhonchi Heart: RRR, no murmurs, gallops, or rubs Abdomen: Soft, mild tenderness diffusely, distended, BS +. Significant hepatosplenomegaly. Extremities: No cyanosis, clubbing, or edema Neurologic: Alert & oriented x3, cranial nerves II-XII intact, strength grossly intact, sensation intact to light touch   Lab Results: Basic Metabolic Panel:  Recent Labs Lab 02/13/15 0955  02/17/15 0945 02/18/15 0230  NA 128*  < > 119* 124*  K 3.2*  < > 3.9 3.2*  CL 84*  < > 82* 88*  CO2 28  < > 19* 22  GLUCOSE 83  < > 84 100*  BUN 14  < > 39* 37*  CREATININE 1.38*  < > 3.92* 2.49*  CALCIUM 7.6*  < > 6.4* 6.6*  MG 1.6  --   --  1.9  PHOS 1.4*  --   --  3.2  < > = values in this interval not displayed.   Liver Function Tests:  Recent Labs Lab 02/17/15 0945 02/18/15 0230  AST 200* 167*  ALT 62* 58*  ALKPHOS 154* 146*  BILITOT 9.7* 9.9*  PROT 5.7* 5.6*  ALBUMIN 2.4* 2.3*    Recent Labs Lab  02/16/15 1322 02/16/15 1752  LIPASE 21* 20*    Recent Labs Lab 02/15/15 2120  AMMONIA 64*   CBC:  Recent Labs Lab 02/15/15 2120 02/16/15 1322 02/17/15 0945 02/18/15 0230  WBC 7.6 7.4 9.4 12.0*  NEUTROABS 5.5 5.0  --   --   HGB 9.8* 10.9* 10.4* 9.4*  HCT 28.4* 31.3* 29.3* 27.0*  MCV 97.6 96.6 95.8 95.4  PLT 175 152 159 259   Coagulation:  Recent Labs Lab 02/15/15 2120 02/16/15 1322 02/17/15 0945 02/18/15 0230  LABPROT 18.1* 17.6* 20.1* 19.9*  INR 1.49 1.44 1.72* 1.69*   Alcohol Level:  Recent Labs Lab 02/15/15 2120  ETH <5   Urinalysis:  Recent Labs Lab 02/16/15 1420  COLORURINE ORANGE*  LABSPEC 1.017  PHURINE 5.0  GLUCOSEU NEGATIVE  HGBUR SMALL*  BILIRUBINUR LARGE*  KETONESUR 15*  PROTEINUR 30*  UROBILINOGEN 1.0  NITRITE NEGATIVE  LEUKOCYTESUR LARGE*    Micro Results: Recent Results (from the past 240 hour(s))  Culture, blood (routine x 2)     Status: None (Preliminary result)   Collection Time: 02/16/15  2:52 PM  Result Value Ref Range Status   Specimen Description BLOOD LEFT ANTECUBITAL  Final   Special Requests BOTTLES DRAWN AEROBIC ONLY 1CC  Final   Culture NO GROWTH < 24 HOURS  Final   Report Status PENDING  Incomplete  Culture, blood (routine x 2)     Status: None (Preliminary result)   Collection Time: 02/16/15  3:07 PM  Result Value Ref Range Status   Specimen Description BLOOD LEFT HAND  Final   Special Requests BOTTLES DRAWN AEROBIC ONLY 1CC  Final   Culture NO GROWTH < 24 HOURS  Final   Report Status PENDING  Incomplete  MRSA PCR Screening     Status: None   Collection Time: 02/16/15  3:30 PM  Result Value Ref Range Status   MRSA by PCR NEGATIVE NEGATIVE Final    Comment:        The GeneXpert MRSA Assay (FDA approved for NASAL specimens only), is one component of a comprehensive MRSA colonization surveillance program. It is not intended to diagnose MRSA infection nor to guide or monitor treatment for MRSA  infections.    Studies/Results: Ct Abdomen Pelvis Wo Contrast  02/17/2015   CLINICAL DATA:  Abdominal pain, distension, cirrhosis.  EXAM: CT ABDOMEN AND PELVIS WITHOUT CONTRAST  TECHNIQUE: Multidetector CT imaging of the abdomen and pelvis was performed following the standard protocol without IV contrast.  COMPARISON:  None.  FINDINGS: The liver is markedly enlarged and fatty infiltrated. No focal liver masses are evident. There is mild splenomegaly. There are mildly prominent upper abdominal vascular structures which may represent varices. There is small volume ascites collected dependently. There is mild gallbladder mural thickening, but this can be seen in the presence of ascites and particularly if there is portal hypertension.  The pancreas and adrenals are grossly normal in appearance. Both kidneys are negative for hydronephrosis. No urinary calculi are evident.  Stomach is unremarkable.  There is slightly prominent caliber of mid abdominal small bowel without abrupt caliber transition. This is likely nonobstructive.  There is no extraluminal air.  Uterus and adnexal structures appear grossly unremarkable.  No acute inflammatory changes are evident in the abdomen or pelvis.  Multifocal patchy airspace opacities are present in both lung bases, likely infectious infiltrate. Aspiration cannot be excluded. There are trace pleural effusions bilaterally.  IMPRESSION: 1. Markedly enlarged liver without focal mass. 2. Findings suggesting portal hypertension, with splenomegaly and mildly prominent upper abdominal vascular structures which may represent varices. 3. Small volume ascites. 4. Nonspecific mild gallbladder mural thickening, which can be seen in the presence of ascites and/or portal hypertension. 5. Small bowel is slightly prominent but appears nonobstructed. 6. No focal inflammatory changes. 7. Multifocal lung base infiltrates, probably infectious. Aspiration cannot be excluded.   Electronically Signed    By: Ellery Plunkaniel R Mitchell M.D.   On: 02/17/2015 05:13   Dg Chest 2 View  02/16/2015   CLINICAL DATA:  24 year old with abdominal pain, nausea and vomiting. History of hepatitis-C and asthma. Ascites.  EXAM: CHEST  2 VIEW  COMPARISON:  None.  FINDINGS: The heart size is at the upper limits of normal, likely exaggerated by a narrow AP diameter of the chest. There is bibasilar atelectasis with central airway thickening, but no edema or confluent airspace opacity. There is no pleural effusion. The abdomen is distended. No ascites was demonstrated on abdominal ultrasound performed earlier today.  IMPRESSION: Bibasilar atelectasis without focal airspace disease or significant pleural effusion.   Electronically Signed   By: Carey BullocksWilliam  Veazey M.D.   On: 02/16/2015 12:26   [place] Abdomen Complete  02/16/2015  CLINICAL DATA:  Hepatitis C, ascites  EXAM: ULTRASOUND ABDOMEN COMPLETE  COMPARISON:  None.  FINDINGS: Gallbladder: No shadowing gallstones are noted within gallbladder. Gallbladder sludge is noted. There is mild edema gallbladder wall measures 4.4 mm thickness. No sonographic Murphy's sign.  Common bile duct: Diameter: 3.3 mm in diameter.  Liver: Hepatomegaly is noted. There is diffuse increased echogenicity of the liver consistent with fatty infiltration or chronic hepatitis.  IVC: No abnormality visualized.  Pancreas: Obscured by overlying bowel gas.  Spleen: Size and appearance within normal limits.  Right Kidney: Length: 13 cm in length. Normal echogenicity. Mild lobulated contour. No hydronephrosis or focal mass.  Left Kidney: Length: 13 cm in length. Echogenicity within normal limits. No mass or hydronephrosis visualized.  Abdominal aorta: No aneurysm.  Measures up to 1.7 cm in diameter.  Other findings: None.  IMPRESSION: 1. No shadowing gallstones are noted within gallbladder. Layering gallbladder sludge. There is mild thickening of gallbladder wall with edematous appearance measures 4.4 mm thickness. No sonographic  Murphy's sign. 2. Normal CBD. 3. Diffuse increased echogenicity of the liver consistent with fatty infiltration or chronic liver disease. No focal hepatic mass. 4. Splenomegaly is noted.  Spleen measures 15.6 cm in length.   Electronically Signed   By: Natasha Mead M.D.   On: 02/16/2015 08:58   Dg Abd Portable 1v  02/16/2015   CLINICAL DATA:  Abdominal distention  EXAM: PORTABLE ABDOMEN - 1 VIEW  COMPARISON:  None.  FINDINGS: There is abnormal dilatation of small bowel in the mid abdomen. Some stool and air is present throughout the colon. No free air is evident on this single supine portable image. No biliary or urinary calculi are evident.  IMPRESSION: Abnormal dilatation of small bowel. This could be a partial or early obstruction. Reactive small bowel dilatation would also be a possibility.   Electronically Signed   By: Ellery Plunk M.D.   On: 02/16/2015 22:21   Medications: I have reviewed the patient's current medications. Scheduled Meds: . folic acid  1 mg Oral Daily  . heparin  5,000 Units Subcutaneous 3 times per day  . midodrine  7.5 mg Oral TID WC  . multivitamin with minerals  1 tablet Oral Daily  . sodium chloride  3 mL Intravenous Q12H  . thiamine  100 mg Oral Daily   Or  . thiamine  100 mg Intravenous Daily   Continuous Infusions:  PRN Meds:.LORazepam **OR** LORazepam, ondansetron **OR** ondansetron (ZOFRAN) IV   Assessment/Plan: [name] is a 24 y.o. female w/ PMHx of Chronic Hepatitis C and alcohol abuse, admitted w/ acute alcoholic hepatitis and AKI.   Acute Alcoholic Hepatitis: Clinically improving, LFT's decreasing, bilirubin still 9.9. Patient still w/ some mild abdominal pain, but overall improvement. PT 19.9, platelets 259. Lost IV access overnight, BP stable for now.  -Appreciate GI input -Regular diet; encourage po hydration -Continue Midodrine 7.5 mg tid for now.  -Move out of stepdown -CIWA protocol  AKI: Most likely related to diuretics and NSAID  use prior to admission. Improved w/ hydration. Cr trend as follows:   Recent Labs Lab 02/13/15 0955 02/15/15 2120 02/16/15 1322 02/17/15 0945 02/18/15 0230  CREATININE 1.38* 4.73* 5.14* 3.92* 2.49*  -Repeat BMP in AM  DVT/PE PPx: Change to Lovenox for ppx (40 mg).   Dispo: Disposition is deferred at this time, awaiting improvement of current medical problems.  Anticipated discharge in approximately 1-2 day(s).   The patient does have a current PCP (Dessa Phi, MD) and  does not need an [facility] hospital follow-up appointment after discharge.  The patient does not have transportation limitations that hinder transportation to clinic appointments.  .Services Needed at time of discharge: Y = Yes, Blank = No PT:   OT:   RN:   Equipment:   Other:     LOS: 2 days   Courtney Paris, MD 02/18/2015, 7:35 AM

## 2015-02-18 NOTE — Progress Notes (Signed)
Pt iv infiltrated.  IV removed.  Called IM on call, ok to leave iv out.

## 2015-02-18 NOTE — Progress Notes (Signed)
Pt transferred to room 6E10 without difficulty.  Pt alert and oriented x 4. Denies pain at this time. Pleasant.

## 2015-02-19 LAB — COMPREHENSIVE METABOLIC PANEL
ALBUMIN: 2.2 g/dL — AB (ref 3.5–5.0)
ALT: 54 U/L (ref 14–54)
AST: 151 U/L — ABNORMAL HIGH (ref 15–41)
Alkaline Phosphatase: 141 U/L — ABNORMAL HIGH (ref 38–126)
Anion gap: 12 (ref 5–15)
BUN: 20 mg/dL (ref 6–20)
CALCIUM: 7.7 mg/dL — AB (ref 8.9–10.3)
CHLORIDE: 93 mmol/L — AB (ref 101–111)
CO2: 26 mmol/L (ref 22–32)
Creatinine, Ser: 0.94 mg/dL (ref 0.44–1.00)
GFR calc non Af Amer: >60 mL/min (ref >60)
GLUCOSE: 91 mg/dL (ref 65–99)
Potassium: 3.8 mmol/L (ref 3.5–5.1)
SODIUM: 131 mmol/L — AB (ref 135–145)
Total Bilirubin: 9.7 mg/dL — ABNORMAL HIGH (ref 0.3–1.2)
Total Protein: 5.7 g/dL — ABNORMAL LOW (ref 6.5–8.1)

## 2015-02-19 LAB — CBC
HCT: 26.4 % — ABNORMAL LOW (ref 36.0–46.0)
Hemoglobin: 9.1 g/dL — ABNORMAL LOW (ref 12.0–15.0)
MCH: 33.7 pg (ref 26.0–34.0)
MCHC: 34.5 g/dL (ref 30.0–36.0)
MCV: 97.8 fL (ref 78.0–100.0)
PLATELETS: 208 10*3/uL (ref 150–400)
RBC: 2.7 MIL/uL — ABNORMAL LOW (ref 3.87–5.11)
RDW: 19.4 % — ABNORMAL HIGH (ref 11.5–15.5)
WBC: 9.2 10*3/uL (ref 4.0–10.5)

## 2015-02-19 MED ORDER — TRAMADOL HCL 50 MG PO TABS
50.0000 mg | ORAL_TABLET | Freq: Two times a day (BID) | ORAL | Status: DC | PRN
  Administered 2015-02-19: 50 mg via ORAL
  Filled 2015-02-19: qty 1

## 2015-02-19 NOTE — Care Management Note (Signed)
Case Management Note  Patient Details  Name: Mary Garrison MRN: [id number] Date of Birth: 31-Oct-1990  Subjective/Objective:   24 yo F admitted with abdominal pain. PMH - hepatitis C, ETOH and heroin abuse.                 Action/Plan: received referral for [facility]   Expected Discharge Date: 02/19/15                 Expected Discharge Plan:  Home/Self Care  In-House Referral:     Discharge planning Services  CM Consult  Post Acute Care Choice:    Choice offered to:     DME Arranged:    DME Agency:     HH Arranged:    HH Agency:     Status of Service:  Completed, signed off  Medicare Important Message Given:    Date Medicare IM Given:    Medicare IM give by:    Date Additional Medicare IM Given:    Additional Medicare Important Message give by:     If discussed at [place] of Stay Meetings, dates discussed:    Additional Comments: met with pt to discuss [facility] referral. She lives with her grandmother. She is up ad lib. She denies any d/c needs.  Norina Buzzard, RN 02/19/2015, 11:06 AM

## 2015-02-19 NOTE — Discharge Instructions (Addendum)
Please avoid NSAIDs. This includes ibuprofen, aspirin, naproxen, etc. Taking too much can hurt your kidneys.  Please be careful with acetaminophen (Tylenol). Taking too much can hurt your liver.   Alcoholic Liver Disease Alcoholic liver disease means you have a damaged liver that does not work properly. This disease is caused by drinking too much alcohol. Some people do not have any problems until the disease has become very bad. Problems can be worse after a period of heavy drinking. HOME CARE  Stop drinking alcohol.  Get expert advice or help (counseling) to quit drinking. Join an alchohol support group.  You may need to eat foods that give you energy (carbohydrates), such as:  Milk and yogurt.  Navy, pinto, and white beans.  Applesauce, grapes, dried dates, prunes, and raisins.  Potatoes and rice.  Eat foods that are high in vitamins, especially thiamine and folic acid. These foods include:  Whole-wheat or whole-grain breads and cereals. Look on the package for "added thiamine" or "added folic acid."  Meat, especially pork.  Fresh, raw vegetables.  Fresh fruits and vegetables, such as oranges, orange juice, avocados, beets, and cantaloupe.  Dark green, leafy vegetables, such as romaine lettuce, spinach, and broccoli.  Beans and nuts.  Dairy foods, such as milk, butter, yogurt, cheese, and ice cream. GET HELP RIGHT AWAY IF:   You have bright red blood in your poop (stool).  You cough or throw up (vomit) blood.  Your skin and eyes turn more yellow.  You have a bad headache or problems thinking.  You have trouble walking. MAKE SURE YOU:   Understand these instructions.  Will watch your condition.  Will get help right away if you are not doing well or get worse. Document Released: 06/01/2009 Document Revised: 10/27/2011 Document Reviewed: 06/01/2009 [facility] Patient Information 2015 [place], [place]. This information is not intended to replace advice given to  you by your health care provider. Make sure you discuss any questions you have with your health care provider.

## 2015-02-19 NOTE — Progress Notes (Signed)
Discharged instructions printed,given and explained to the patient and her mother.Questions answered  On their satisfaction.

## 2015-02-19 NOTE — Discharge Summary (Signed)
Name: Mary Garrison MRN: [id number] DOB: 04-Aug-1991 24 y.o. PCP: Dessa Phi, MD  Date of Admission: 02/16/2015 10:37 AM Date of Discharge: 02/19/2015 Attending Physician: Earl Lagos, MD  Discharge Diagnosis: Principal Problem:   Acute alcoholic hepatitis Active Problems:   Malnutrition of moderate degree   Acute renal failure syndrome  Discharge Medications:   Medication List    STOP taking these medications        furosemide 40 MG tablet  Commonly known as:  LASIX     ibuprofen 200 MG tablet  Commonly known as:  ADVIL,MOTRIN     magnesium oxide 400 MG tablet  Commonly known as:  MAG-OX     metoprolol succinate 25 MG 24 hr tablet  Commonly known as:  TOPROL-XL     potassium & sodium phosphates 280-160-250 MG Pack  Commonly known as:  PHOS-NAK     potassium chloride SA 20 MEQ tablet  Commonly known as:  K-DUR,KLOR-CON     spironolactone 25 MG tablet  Commonly known as:  ALDACTONE      TAKE these medications        albuterol 108 (90 BASE) MCG/ACT inhaler  Commonly known as:  PROVENTIL HFA;VENTOLIN HFA  Inhale 2 puffs into the lungs every 6 (six) hours as needed. For shortness of breath     AVIANE 0.1-20 MG-MCG tablet  Generic drug:  levonorgestrel-ethinyl estradiol  Take 1 tablet by mouth daily.     B-1 PO  Take 1 tablet by mouth daily.     B-12 PO  Take 1 tablet by mouth daily.     B-6 PO  Take 1 tablet by mouth daily.     calcium-vitamin D 500-200 MG-UNIT per tablet  Commonly known as:  OSCAL WITH D  Take 1 tablet by mouth 2 (two) times daily.     cholecalciferol 1000 UNITS tablet  Commonly known as:  VITAMIN D  Take 1 tablet (1,000 Units total) by mouth daily.     Diclofenac Sodium 3 % Gel  Place 1 application onto the skin as needed (for muscle pain).     folic acid 1 MG tablet  Commonly known as:  FOLVITE  Take 1 tablet (1 mg total) by mouth daily.     MULTIVITAMIN PO  Take 1 tablet by mouth daily.     ondansetron 8 MG  disintegrating tablet  Commonly known as:  ZOFRAN ODT  Take 1 tablet (8 mg total) by mouth every 8 (eight) hours as needed for nausea or vomiting.     ranitidine 150 MG tablet  Commonly known as:  ZANTAC  Take 1 tablet (150 mg total) by mouth 2 (two) times daily as needed for heartburn.     traMADol 50 MG tablet  Commonly known as:  ULTRAM  Take 1 tablet (50 mg total) by mouth every 12 (twelve) hours as needed.        Disposition and follow-up:   Ms.Tempest Haston was discharged from [facility] in Stable condition.  At the hospital follow up visit please address:  1.  Acute alcoholic hepatitis: She was discharged with instructions to follow-up with Dr. Arlyce Dice of [facility] in 4 weeks. She was encouraged to abstain from alcohol use.  2.  Labs / imaging needed at time of follow-up: CMP  3.  Pending labs/ test needing follow-up: None   Follow-up Appointments: Follow-up Information    Follow up with Lora Paula, MD. Schedule an appointment as soon as possible for  a visit in 1 week.   Specialty:  Family Medicine   Why:  Hospital follow up   Contact information:   [address] [place] [address] [phone]       Follow up with Melvia Heapsobert Kaplan, MD. Schedule an appointment as soon as possible for a visit in 4 weeks.   Specialty:  Gastroenterology   Why:  Follow up of liver disease   Contact information:   [address] [place] [address] [phone]       Discharge Instructions:  Discharge Instructions    Call MD for:  difficulty breathing, headache or visual disturbances    Complete by:  As directed      Call MD for:  persistant dizziness or light-headedness    Complete by:  As directed      Call MD for:  persistant nausea and vomiting    Complete by:  As directed      Call MD for:  severe uncontrolled pain    Complete by:  As directed      Call MD for:  temperature >100.4    Complete by:  As directed      Increase activity  slowly    Complete by:  As directed            Consultations: Treatment Team:  Louis Meckelobert D Kaplan, MD Jeani HawkingPatrick Hung, MD  Procedures Performed:  Ct Abdomen Pelvis Wo Contrast  02/17/2015   CLINICAL DATA:  Abdominal pain, distension, cirrhosis.  EXAM: CT ABDOMEN AND PELVIS WITHOUT CONTRAST  TECHNIQUE: Multidetector CT imaging of the abdomen and pelvis was performed following the standard protocol without IV contrast.  COMPARISON:  None.  FINDINGS: The liver is markedly enlarged and fatty infiltrated. No focal liver masses are evident. There is mild splenomegaly. There are mildly prominent upper abdominal vascular structures which may represent varices. There is small volume ascites collected dependently. There is mild gallbladder mural thickening, but this can be seen in the presence of ascites and particularly if there is portal hypertension.  The pancreas and adrenals are grossly normal in appearance. Both kidneys are negative for hydronephrosis. No urinary calculi are evident.  Stomach is unremarkable.  There is slightly prominent caliber of mid abdominal small bowel without abrupt caliber transition. This is likely nonobstructive.  There is no extraluminal air.  Uterus and adnexal structures appear grossly unremarkable.  No acute inflammatory changes are evident in the abdomen or pelvis.  Multifocal patchy airspace opacities are present in both lung bases, likely infectious infiltrate. Aspiration cannot be excluded. There are trace pleural effusions bilaterally.  IMPRESSION: 1. Markedly enlarged liver without focal mass. 2. Findings suggesting portal hypertension, with splenomegaly and mildly prominent upper abdominal vascular structures which may represent varices. 3. Small volume ascites. 4. Nonspecific mild gallbladder mural thickening, which can be seen in the presence of ascites and/or portal hypertension. 5. Small bowel is slightly prominent but appears nonobstructed. 6. No focal inflammatory changes.  7. Multifocal lung base infiltrates, probably infectious. Aspiration cannot be excluded.   Electronically Signed   By: Ellery Plunkaniel R Mitchell M.D.   On: 02/17/2015 05:13   Dg Chest 2 View  02/16/2015   CLINICAL DATA:  24 year old with abdominal pain, nausea and vomiting. History of hepatitis-C and asthma. Ascites.  EXAM: CHEST  2 VIEW  COMPARISON:  None.  FINDINGS: The heart size is at the upper limits of normal, likely exaggerated by a narrow AP diameter of the chest. There is bibasilar atelectasis with central airway  thickening, but no edema or confluent airspace opacity. There is no pleural effusion. The abdomen is distended. No ascites was demonstrated on abdominal ultrasound performed earlier today.  IMPRESSION: Bibasilar atelectasis without focal airspace disease or significant pleural effusion.   Electronically Signed   By: Carey Bullocks M.D.   On: 02/16/2015 12:26   US Abdomen Complete  02/16/2015   CLINICAL DATA:  Hepatitis C, ascites  EXAM: ULTRASOUND ABDOMEN COMPLETE  COMPARISON:  None.  FINDINGS: Gallbladder: No shadowing gallstones are noted within gallbladder. Gallbladder sludge is noted. There is mild edema gallbladder wall measures 4.4 mm thickness. No sonographic Murphy's sign.  Common bile duct: Diameter: 3.3 mm in diameter.  Liver: Hepatomegaly is noted. There is diffuse increased echogenicity of the liver consistent with fatty infiltration or chronic hepatitis.  IVC: No abnormality visualized.  Pancreas: Obscured by overlying bowel gas.  Spleen: Size and appearance within normal limits.  Right Kidney: Length: 13 cm in length. Normal echogenicity. Mild lobulated contour. No hydronephrosis or focal mass.  Left Kidney: Length: 13 cm in length. Echogenicity within normal limits. No mass or hydronephrosis visualized.  Abdominal aorta: No aneurysm.  Measures up to 1.7 cm in diameter.  Other findings: None.  IMPRESSION: 1. No shadowing gallstones are noted within gallbladder. Layering gallbladder  sludge. There is mild thickening of gallbladder wall with edematous appearance measures 4.4 mm thickness. No sonographic Murphy's sign. 2. Normal CBD. 3. Diffuse increased echogenicity of the liver consistent with fatty infiltration or chronic liver disease. No focal hepatic mass. 4. Splenomegaly is noted.  Spleen measures 15.6 cm in length.   Electronically Signed   By: Natasha Mead M.D.   On: 02/16/2015 08:58   Dg Abd Portable 1v  02/16/2015   CLINICAL DATA:  Abdominal distention  EXAM: PORTABLE ABDOMEN - 1 VIEW  COMPARISON:  None.  FINDINGS: There is abnormal dilatation of small bowel in the mid abdomen. Some stool and air is present throughout the colon. No free air is evident on this single supine portable image. No biliary or urinary calculi are evident.  IMPRESSION: Abnormal dilatation of small bowel. This could be a partial or early obstruction. Reactive small bowel dilatation would also be a possibility.   Electronically Signed   By: Ellery Plunk M.D.   On: 02/16/2015 22:21    Admission HPI: Ms. Mysti Haley is a 24 year old woman with history of hepatitis C, alcohol abuse, heroin abuse presenting with abdominal pain. It started 1 week ago. It is epigastric and feels like a contraction. It is constant. Denies relieving factors. Worsening after eating. She has never had this pain before. She has had associated increase in her abdominal girth. She has had intermittent nausea and vomiting but denies hemetemesis. Denies diarrhea or steatorrhea. Her last BM was yesterday. She has some mild dysuria but denies hematuria. She has noted a decrease in her urine output and her urine is darker. She has noticed jaundice for the past 2 days and pruritis. She also has subjective fevers, generalized weakness, cough, shortness of breath and some lightheadedness with standing. Denies vision changes, chest pain, edema, rash.  She is in recovery from her alcohol abuse but had a relapse last week on Wednesday about  3 days before her symptoms started. Her hepatitis C was diagnosed 11/2013 which she attributes to having a tattoo done.  Hospital Course by problem list:   Acute alcoholic hepatitis: History of heavy alcohol use and AST:ALT ratio of ?2. Also has a history of hepatitis C.  EtOH level negative. Negative serum pregnancy. US abdomen with no cholelithiasis, +layering gallbladder sludge and mild thickening of gallbladder wall, no sonographic Murphy's sign, normal CBD, diffuse increased echogenicity of the liver with no focal hepatic mass, splenomegaly. No signs of encephalopathy. She was admitted to the stepdown unit. Her abdomen was evaluated with bedside ultrasound. No fluid appreciated. Discussed with radiology who reviewed imaging and did not identify ascites. Enlarged abdomen most likely due to organomegaly. Since small volume ascites could not be as excluded she was started on Levaquin to presumptively cover for possible SBP. She had hypotension with concern for hepatorenal syndrome as her creatinine was elevated. She was started on octreotide, albumin, and midodrine. She was seen in consultation by GI and renal. She was also started on prednisolone 40 mg daily by GI for high discriminate function. She was given IV fluid boluses with response in her blood pressure. She was evaluated by PCCM for the possibility that she would need pressors. They ordered a KUB concerning for possible SBO. CT abdomen and pelvis with markedly enlarged liver without focal mass, splenomegaly, mildly prominent upper abdominal vascular structures, small volume ascites, nonobstructive small bowel. Her discriminate function was recalculated and since she was borderline prednisolone 40 mg was held off. Acetaminophen level and acute hepatitis panel were noncontributory. Is later decided that she was unlikely to have SBP so her antibiotics, midodrine, albumin, and octreotide were discontinued. Her diet was advanced to regular and her IV fluids  were discontinued. By discharge she reports improvement in her symptoms and her LFTs were decreasing. She was discharged with instructions to follow-up with Dr. Arlyce Dice of [facility] in 4 weeks. She was encouraged to abstain from alcohol use.  Prerenal azotemia: AKI with cr on admission 5.14. Her creatinine was 1.38 on 02/13/2015 and 0.54 12/27/2014. Kidneys with no evidence of obstruction or parenchymal renal disease on [place]. She was on diuretics prior to her hospitalization as well as heavy NSAID use. This likely led to her prerenal azotemia. Her creatinine dropped and returned to baseline by discharge with IV fluids.  Discharge Vitals:   BP 101/57 mmHg  Pulse 86  Temp(Src) 98.5 F (36.9 C) (Oral)  Resp 18  Ht  (1.651 m)  Wt 131 lb 13.4 oz (59.8 kg)  BMI 21.94 kg/m2  SpO2 94%  LMP  (Within Months)  Discharge Labs:  Results for orders placed or performed during the hospital encounter of 02/16/15 (from the past 24 hour(s))  CBC     Status: Abnormal   Collection Time: 02/19/15  4:16 AM  Result Value Ref Range   WBC 9.2 4.0 - 10.5 K/uL   RBC 2.70 (L) 3.87 - 5.11 MIL/uL   Hemoglobin 9.1 (L) 12.0 - 15.0 g/dL   HCT 16.1 (L) 09.6 - 04.5 %   MCV 97.8 78.0 - 100.0 fL   MCH 33.7 26.0 - 34.0 pg   MCHC 34.5 30.0 - 36.0 g/dL   RDW 40.9 (H) 81.1 - 91.4 %   Platelets 208 150 - 400 K/uL  Comprehensive metabolic panel     Status: Abnormal   Collection Time: 02/19/15  4:16 AM  Result Value Ref Range   Sodium 131 (L) 135 - 145 mmol/L   Potassium 3.8 3.5 - 5.1 mmol/L   Chloride 93 (L) 101 - 111 mmol/L   CO2 26 22 - 32 mmol/L   Glucose, Bld 91 65 - 99 mg/dL   BUN 20 6 - 20 mg/dL   Creatinine, Ser 7.82 0.44 -  1.00 mg/dL   Calcium 7.7 (L) 8.9 - 10.3 mg/dL   Total Protein 5.7 (L) 6.5 - 8.1 g/dL   Albumin 2.2 (L) 3.5 - 5.0 g/dL   AST 284 (H) 15 - 41 U/L   ALT 54 14 - 54 U/L   Alkaline Phosphatase 141 (H) 38 - 126 U/L   Total Bilirubin 9.7 (H) 0.3 - 1.2 mg/dL   GFR calc non Af Amer >60 >60  mL/min   GFR calc Af Amer >60 >60 mL/min   Anion gap 12 5 - 15    Signed: Lora Paula, MD 02/19/2015, 8:44 AM    Services Ordered on Discharge: None Equipment Ordered on Discharge: None

## 2015-02-19 NOTE — Progress Notes (Signed)
Subjective: No complaints.  Feeling well.  Objective: Vital signs in last 24 hours: Temp:  [98.5 F (36.9 C)-99.2 F (37.3 C)] 98.5 F (36.9 C) (07/04 0807) Pulse Rate:  [75-86] 86 (07/04 0807) Resp:  [16-18] 18 (07/04 0807) BP: (101-112)/(48-59) 101/57 mmHg (07/04 0807) SpO2:  [94 %-96 %] 94 % (07/04 0807) Last BM Date: 02/16/15  Intake/Output from previous day: 07/03 0701 - 07/04 0700 In: 680 [P.O.:680] Out: 300 [Urine:300] Intake/Output this shift: Total I/O In: 240 [P.O.:240] Out: -   General appearance: alert and no distress GI: distended secondary to hepatomegaly  Lab Results:  Recent Labs  02/17/15 0945 02/18/15 0230 02/19/15 0416  WBC 9.4 12.0* 9.2  HGB 10.4* 9.4* 9.1*  HCT 29.3* 27.0* 26.4*  PLT 159 259 208   BMET  Recent Labs  02/17/15 0945 02/18/15 0230 02/19/15 0416  NA 119* 124* 131*  K 3.9 3.2* 3.8  CL 82* 88* 93*  CO2 19* 22 26  GLUCOSE 84 100* 91  BUN 39* 37* 20  CREATININE 3.92* 2.49* 0.94  CALCIUM 6.4* 6.6* 7.7*   LFT  Recent Labs  02/19/15 0416  PROT 5.7*  ALBUMIN 2.2*  AST 151*  ALT 54  ALKPHOS 141*  BILITOT 9.7*   PT/INR  Recent Labs  02/17/15 0945 02/18/15 0230  LABPROT 20.1* 19.9*  INR 1.72* 1.69*   Hepatitis Panel  Recent Labs  02/16/15 1600  HEPBSAG Negative  HCVAB >11.0*  HEPAIGM Negative  HEPBIGM Negative   C-Diff No results for input(s): CDIFFTOX in the last 72 hours. Fecal Lactopherrin No results for input(s): FECLLACTOFRN in the last 72 hours.  Studies/Results: No results found.  Medications:  Scheduled: . enoxaparin (LOVENOX) injection  40 mg Subcutaneous Q24H  . folic acid  1 mg Oral Daily  . multivitamin with minerals  1 tablet Oral Daily  . sodium chloride  3 mL Intravenous Q12H  . thiamine  100 mg Oral Daily   Continuous:   Assessment/Plan: 1) ETOH hepatitis. 2) Hyponatremia. 3) Resolved renal failure. 4) ETOH abuse.   She has recovered remarkably well.  Her TB will be the  last to normalize.  She also has HCV and she will require treatment at some point.  I encouraged her to seek professional counseling for her ETOH abuse.  Plan: 1) Follow up with Dr. Arlyce DiceKaplan, [facility] in 4 weeks. 2) Signing off.  LOS: 3 days   Clerance Umland D 02/19/2015, 8:28 AM

## 2015-02-19 NOTE — Progress Notes (Signed)
Subjective: No acute events overnight. Feels better this morning. Abdominal pain has improved. Denies nausea/vomiting  Objective: Vital signs in last 24 hours: Filed Vitals:   02/18/15 1349 02/18/15 1737 02/18/15 2050 02/19/15 0441  BP: 112/59 101/50 105/48 109/57  Pulse: 81 75 81 85  Temp: 98.6 F (37 C) 99.2 F (37.3 C) 99 F (37.2 C) 99.1 F (37.3 C)  TempSrc: Oral Oral Oral Oral  Resp: 17 16 18 16   Height:      Weight:      SpO2: 96% 94% 95% 94%   Weight change:   Intake/Output Summary (Last 24 hours) at 02/19/15 16100807 Last data filed at 02/18/15 1756  Gross per 24 hour  Intake    600 ml  Output    300 ml  Net    300 ml   Physical Exam: General: White female, alert, cooperative, NAD. HEENT: PERRL, EOMI. Moist mucus membranes. Icterus.  Neck: Full range of motion without pain, supple, no lymphadenopathy or carotid bruits Lungs: Clear to ascultation bilaterally, normal work of respiration, no wheezes, rales, rhonchi Heart: RRR, no murmurs, gallops, or rubs Abdomen: Soft, nontender, distended, BS +. Significant hepatosplenomegaly. Extremities: No cyanosis, clubbing, or edema Neurologic: Alert & oriented x3, cranial nerves II-XII intact, strength grossly intact, sensation intact to light touch   Lab Results: Basic Metabolic Panel:  Recent Labs Lab 02/13/15 0955  02/18/15 0230 02/19/15 0416  NA 128*  < > 124* 131*  K 3.2*  < > 3.2* 3.8  CL 84*  < > 88* 93*  CO2 28  < > 22 26  GLUCOSE 83  < > 100* 91  BUN 14  < > 37* 20  CREATININE 1.38*  < > 2.49* 0.94  CALCIUM 7.6*  < > 6.6* 7.7*  MG 1.6  --  1.9  --   PHOS 1.4*  --  3.2  --   < > = values in this interval not displayed.   Liver Function Tests:  Recent Labs Lab 02/18/15 0230 02/19/15 0416  AST 167* 151*  ALT 58* 54  ALKPHOS 146* 141*  BILITOT 9.9* 9.7*  PROT 5.6* 5.7*  ALBUMIN 2.3* 2.2*    Recent Labs Lab 02/16/15 1322 02/16/15 1752  LIPASE 21* 20*    Recent Labs Lab 02/15/15 2120    AMMONIA 64*   CBC:  Recent Labs Lab 02/15/15 2120 02/16/15 1322  02/18/15 0230 02/19/15 0416  WBC 7.6 7.4  < > 12.0* 9.2  NEUTROABS 5.5 5.0  --   --   --   HGB 9.8* 10.9*  < > 9.4* 9.1*  HCT 28.4* 31.3*  < > 27.0* 26.4*  MCV 97.6 96.6  < > 95.4 97.8  PLT 175 152  < > 259 208  < > = values in this interval not displayed. Coagulation:  Recent Labs Lab 02/15/15 2120 02/16/15 1322 02/17/15 0945 02/18/15 0230  LABPROT 18.1* 17.6* 20.1* 19.9*  INR 1.49 1.44 1.72* 1.69*   Alcohol Level:  Recent Labs Lab 02/15/15 2120  ETH <5   Urinalysis:  Recent Labs Lab 02/16/15 1420  COLORURINE ORANGE*  LABSPEC 1.017  PHURINE 5.0  GLUCOSEU NEGATIVE  HGBUR SMALL*  BILIRUBINUR LARGE*  KETONESUR 15*  PROTEINUR 30*  UROBILINOGEN 1.0  NITRITE NEGATIVE  LEUKOCYTESUR LARGE*    Micro Results: Recent Results (from the past 240 hour(s))  Culture, blood (routine x 2)     Status: None (Preliminary result)   Collection Time: 02/16/15  2:52 PM  Result  Value Ref Range Status   Specimen Description BLOOD LEFT ANTECUBITAL  Final   Special Requests BOTTLES DRAWN AEROBIC ONLY 1CC  Final   Culture NO GROWTH 2 DAYS  Final   Report Status PENDING  Incomplete  Culture, blood (routine x 2)     Status: None (Preliminary result)   Collection Time: 02/16/15  3:07 PM  Result Value Ref Range Status   Specimen Description BLOOD LEFT HAND  Final   Special Requests BOTTLES DRAWN AEROBIC ONLY 1CC  Final   Culture NO GROWTH 2 DAYS  Final   Report Status PENDING  Incomplete  MRSA PCR Screening     Status: None   Collection Time: 02/16/15  3:30 PM  Result Value Ref Range Status   MRSA by PCR NEGATIVE NEGATIVE Final    Comment:        The GeneXpert MRSA Assay (FDA approved for NASAL specimens only), is one component of a comprehensive MRSA colonization surveillance program. It is not intended to diagnose MRSA infection nor to guide or monitor treatment for MRSA infections.     Studies/Results: No results found.   Medications: I have reviewed the patient's current medications. Scheduled Meds: . enoxaparin (LOVENOX) injection  40 mg Subcutaneous Q24H  . folic acid  1 mg Oral Daily  . multivitamin with minerals  1 tablet Oral Daily  . sodium chloride  3 mL Intravenous Q12H  . thiamine  100 mg Oral Daily   Continuous Infusions:  PRN Meds:.LORazepam **OR** LORazepam, ondansetron **OR** ondansetron (ZOFRAN) IV   Assessment/Plan: [name] is a 24 y.o. female w/ PMHx of Chronic Hepatitis C and alcohol abuse, admitted w/ acute alcoholic hepatitis and AKI.   Acute Alcoholic Hepatitis: Clinically improving, LFT's decreasing, bilirubin stable.  -Appreciate GI input -Regular diet; encourage po hydration -Continue Midodrine 7.5 mg tid for now.  -CIWA protocol  AKI: Most likely related to diuretics and NSAID use prior to admission. Improved w/ hydration. Cr trend as follows:   Recent Labs Lab 02/15/15 2120 02/16/15 1322 02/17/15 0945 02/18/15 0230 02/19/15 0416  CREATININE 4.73* 5.14* 3.92* 2.49* 0.94   DVT/PE PPx: Lovenox   Dispo: Likely home today.  The patient does have a current PCP (Mary Phi, MD) and does not need an [facility] hospital follow-up appointment after discharge.  The patient does not have transportation limitations that hinder transportation to clinic appointments.  .Services Needed at time of discharge: Y = Yes, Blank = No PT:   OT:   RN:   Equipment:   Other:     LOS: 3 days   Lora Paula, MD 02/19/2015, 8:06 AM

## 2015-02-21 ENCOUNTER — Other Ambulatory Visit: Payer: Self-pay | Admitting: Internal Medicine

## 2015-02-21 DIAGNOSIS — B182 Chronic viral hepatitis C: Secondary | ICD-10-CM

## 2015-02-21 LAB — CULTURE, BLOOD (ROUTINE X 2)
Culture: NO GROWTH
Culture: NO GROWTH

## 2015-02-23 ENCOUNTER — Telehealth: Payer: Self-pay | Admitting: Family Medicine

## 2015-02-23 NOTE — Telephone Encounter (Signed)
-----   Message from Josalyn Funches, MD sent at 02/22/2015  9:15 AM EDT ----- Negative blood cultures 

## 2015-02-23 NOTE — Telephone Encounter (Signed)
Pt has an appt on 7/19 for a hospital f/u. Pt is saying that she is in a lot of pain and is hoping to be seen sooner. Pt was diagnosed with renal failure and cirrhosis of the liver. Please follow up with pt for advise. Thank you.

## 2015-02-23 NOTE — Telephone Encounter (Signed)
Pt aware of results 

## 2015-02-27 ENCOUNTER — Ambulatory Visit: Payer: Medicaid Other | Admitting: *Deleted

## 2015-02-27 DIAGNOSIS — B182 Chronic viral hepatitis C: Secondary | ICD-10-CM

## 2015-02-27 DIAGNOSIS — F1010 Alcohol abuse, uncomplicated: Secondary | ICD-10-CM
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
## 2015-02-28 LAB — DRUG SCREEN, URINE
AMPHETAMINE SCRN UR: NEGATIVE
BENZODIAZEPINES.: POSITIVE — AB
Barbiturate Quant, Ur: NEGATIVE
Cocaine Metabolites: NEGATIVE
Creatinine,U: 165.11 mg/dL
Marijuana Metabolite: NEGATIVE
Methadone: NEGATIVE
Opiates: NEGATIVE
Phencyclidine (PCP): NEGATIVE
Propoxyphene: NEGATIVE

## 2015-03-06 ENCOUNTER — Telehealth: Payer: Self-pay | Admitting: Gastroenterology

## 2015-03-06 ENCOUNTER — Ambulatory Visit (HOSPITAL_BASED_OUTPATIENT_CLINIC_OR_DEPARTMENT_OTHER): Payer: Medicaid Other | Admitting: Family Medicine

## 2015-03-06 ENCOUNTER — Telehealth: Payer: Self-pay | Admitting: Family Medicine

## 2015-03-06 ENCOUNTER — Ambulatory Visit (HOSPITAL_COMMUNITY)
Admission: RE | Admit: 2015-03-06 | Discharge: 2015-03-06 | Disposition: A | Discharge status: Home / Self Care | Payer: Medicaid Other | Source: Ambulatory Visit | Attending: Family Medicine | Admitting: Family Medicine

## 2015-03-06 ENCOUNTER — Encounter: Payer: Self-pay | Admitting: Family Medicine

## 2015-03-06 ENCOUNTER — Encounter: Payer: Self-pay | Admitting: Gastroenterology

## 2015-03-06 VITALS — BP 105/66 | HR 94 | Temp 99.3°F | Resp 16 | Ht 65.0 in | Wt 118.0 lb

## 2015-03-06 DIAGNOSIS — N179 Acute kidney failure, unspecified: Secondary | ICD-10-CM | POA: Diagnosis not present

## 2015-03-06 DIAGNOSIS — G44219 Episodic tension-type headache, not intractable: Secondary | ICD-10-CM

## 2015-03-06 DIAGNOSIS — R059 Cough, unspecified: Secondary | ICD-10-CM | POA: Insufficient documentation

## 2015-03-06 DIAGNOSIS — R05 Cough: Secondary | ICD-10-CM

## 2015-03-06 DIAGNOSIS — F1010 Alcohol abuse, uncomplicated: Secondary | ICD-10-CM

## 2015-03-06 DIAGNOSIS — K7010 Alcoholic hepatitis without ascites: Secondary | ICD-10-CM | POA: Diagnosis not present

## 2015-03-06 DIAGNOSIS — J811 Chronic pulmonary edema: Secondary | ICD-10-CM | POA: Insufficient documentation

## 2015-03-06 DIAGNOSIS — I517 Cardiomegaly: Secondary | ICD-10-CM | POA: Diagnosis not present

## 2015-03-06 DIAGNOSIS — J4520 Mild intermittent asthma, uncomplicated: Secondary | ICD-10-CM

## 2015-03-06 DIAGNOSIS — R51 Headache: Secondary | ICD-10-CM

## 2015-03-06 DIAGNOSIS — R634 Abnormal weight loss: Secondary | ICD-10-CM | POA: Diagnosis not present

## 2015-03-06 DIAGNOSIS — E876 Hypokalemia: Secondary | ICD-10-CM

## 2015-03-06 DIAGNOSIS — R519 Headache, unspecified: Secondary | ICD-10-CM | POA: Insufficient documentation

## 2015-03-06 DIAGNOSIS — R300 Dysuria: Secondary | ICD-10-CM | POA: Insufficient documentation

## 2015-03-06 LAB — COMPLETE METABOLIC PANEL WITH GFR
ALT: 35 U/L (ref 0–35)
AST: 217 U/L — ABNORMAL HIGH (ref 0–37)
Albumin: 2.4 g/dL — ABNORMAL LOW (ref 3.5–5.2)
Alkaline Phosphatase: 121 U/L — ABNORMAL HIGH (ref 39–117)
BUN: 4 mg/dL — AB (ref 6–23)
CALCIUM: 8.3 mg/dL — AB (ref 8.4–10.5)
CHLORIDE: 97 meq/L (ref 96–112)
CO2: 23 mEq/L (ref 19–32)
CREATININE: 0.31 mg/dL — AB (ref 0.50–1.10)
GFR, Est Non African American: >89 mL/min
GLUCOSE: 81 mg/dL (ref 70–99)
Potassium: 3.3 mEq/L — ABNORMAL LOW (ref 3.5–5.3)
SODIUM: 131 meq/L — AB (ref 135–145)
Total Bilirubin: 10.3 mg/dL — ABNORMAL HIGH (ref 0.2–1.2)
Total Protein: 6.1 g/dL (ref 6.0–8.3)

## 2015-03-06 LAB — POCT URINALYSIS DIPSTICK
Glucose, UA: 100
NITRITE UA: POSITIVE
PROTEIN UA: 30
RBC UA: NEGATIVE
Spec Grav, UA: 1.02
Urobilinogen, UA: 4
pH, UA: 6.5

## 2015-03-06 LAB — POCT INR: INR: 1.5

## 2015-03-06 LAB — AMMONIA: AMMONIA: 60 umol/L — AB (ref 16–53)

## 2015-03-06 MED ORDER — NITROFURANTOIN MONOHYD MACRO 100 MG PO CAPS: 100.0000 mg | ORAL_CAPSULE | Freq: Two times a day (BID) | ORAL | Status: DC

## 2015-03-06 MED ORDER — ALBUTEROL SULFATE HFA 108 (90 BASE) MCG/ACT IN AERS: 2.0000 | INHALATION_SPRAY | Freq: Four times a day (QID) | RESPIRATORY_TRACT | Status: DC | PRN

## 2015-03-06 MED ORDER — ONDANSETRON 8 MG PO TBDP: 8.0000 mg | ORAL_TABLET | Freq: Three times a day (TID) | ORAL | Status: DC | PRN

## 2015-03-06 MED ORDER — TRAMADOL HCL 50 MG PO TABS: 50.0000 mg | ORAL_TABLET | Freq: Two times a day (BID) | ORAL | Status: DC | PRN

## 2015-03-06 MED ORDER — LEVOFLOXACIN 500 MG PO TABS: 500.0000 mg | ORAL_TABLET | Freq: Every day | ORAL | Status: DC

## 2015-03-06 MED ORDER — GUAIFENESIN ER 600 MG PO TB12: 600.0000 mg | ORAL_TABLET | Freq: Two times a day (BID) | ORAL | Status: DC

## 2015-03-06 NOTE — Assessment & Plan Note (Signed)
A: persistent stigmata of hepatitis and likely cirrhosis P: CMP GI f/u appt scheduled

## 2015-03-06 NOTE — Patient Instructions (Addendum)
[name],  Thank you for coming in today  1. Gi f/u: I called [facility], you will receive a call to schedule an appt with one of the PAs soon Follow up labs today Drink nutrition supplements to get protein in your diet  2. Cough: Clear lungs CXR ordered since you were recently hospitalized Use albuterol and take mucinex for cough  3. Tramadol refilled, zofran refilled  4. Headache: be sure to take tramadol with a little food or nutrition supplement.  Unfortunately, muscle relaxers are not recommended in the setting of liver impairment   5. Dysuria: There is evidence of possible UTI, urine sent for culture Take macrobid for next 3 days   F/u with me in 4 weeks for hepatitis   Dr. Armen PickupFunches

## 2015-03-06 NOTE — Telephone Encounter (Signed)
I have her scheduled with you. Very complicated patient. She is on the schedule for 03/22/15

## 2015-03-06 NOTE — Telephone Encounter (Signed)
  Patient called  Spoke to patient  CXR revealed pnuemonitis with is inflammation of the lungs. This can be due to viral or bacterial infection Since she was recently hospitalized, patient to take levaquin for 7 days  Patient to not take macrobid since levaquin should also cover UTI if present   Patient voiced understanding and agreed with plan   [facility]

## 2015-03-06 NOTE — Assessment & Plan Note (Signed)
A; cough with pnuemonitis on CXR recently hospitalized x 3 days  P: Albuterol mucinex levaquin x 7 days

## 2015-03-06 NOTE — Progress Notes (Signed)
   Subjective:    Patient ID: Mary Garrison, female    DOB: 1990-09-12, 24 y.o.   MRN: [id number] CC: HFU alcoholic hepatitis and acute renal failure  HPI 24 yo F presents for f/u:   1. Alcoholic hepatitis: no ETOH x one month. Having abdominal distension, pain and nausea. Avoiding tylenol and NSAIDs.   2. Acute renal failure: not longer taking NSAIDs or diruetics. Having some LE edema. Having and darker urine.   3, cough: x one week. Productive of clear sputum. Has SOB with exertion. No fever or chills.   Soc Hx: current smoker  Review of Systems  Constitutional: Negative for fever and chills.  Respiratory: Negative for shortness of breath.   Cardiovascular: Negative for chest pain.  Gastrointestinal: Positive for nausea, abdominal pain and abdominal distention. Negative for vomiting and blood in stool.  Musculoskeletal: Positive for back pain.  Skin: Positive for color change. Negative for rash.  Hematological: Bruises/bleeds easily.  Psychiatric/Behavioral: Negative for suicidal ideas and dysphoric mood.      Objective:   Physical Exam BP 105/66 mmHg  Pulse 94  Temp(Src) 99.3 F (37.4 C) (Oral)  Resp 16  Ht 5\' 5"  (1.651 m)  Wt 118 lb (53.524 kg)  BMI 19.64 kg/m2  SpO2 99%  LMP  (Within Months)  Wt Readings from Last 3 Encounters:  03/06/15 118 lb (53.524 kg)  02/18/15 131 lb 13.4 oz (59.8 kg)  02/16/15 124 lb (56.246 kg)  General appearance: alert, cooperative and no distress, thin limbs  Eyes: icterus Nose: normal turbinates, no swelling or discharge Throat: normal oropharynx  Lungs: clear to auscultation bilaterally Heart: regular rate and rhythm, S1, S2 normal, no murmur, click, rub or gallop Abdomen: distended, round, hepatomegaly, mild TTP,  Skin: jaundice with multiple punctuate bruises  UA: brown, 100 glucose, trace ketones, small LE, positive nitrites  INR: 1.5  Treated with albuterol neb x one which helped SOB     Assessment & Plan:

## 2015-03-06 NOTE — Assessment & Plan Note (Addendum)
Dysuria: There is evidence of possible UTI, urine sent for culture Take macrobid for next 3 days   E coli in urine resistant to levaquin macrobid re ordered

## 2015-03-06 NOTE — Telephone Encounter (Signed)
Dr. Armen PickupFunches called to scheduled hospital follow up.  Patient was treated by Dr. Elnoria HowardHung at hospital, but it states to follow up with Dr. Arlyce DiceKaplan within 4 weeks.  [facility].  Reason for hospital follow up is for Hep C and Alcoholic cirrhosis.  Can you advise if Dr. Arlyce DiceKaplan will treat these issues and advise for scheduling.

## 2015-03-06 NOTE — Assessment & Plan Note (Signed)
A; headache with tramadol P: be sure to take tramadol with a little food or nutrition supplement.  Unfortunately, muscle relaxers are not recommended in the setting of liver impairment

## 2015-03-06 NOTE — Progress Notes (Signed)
Requesting GI Referral

## 2015-03-07 ENCOUNTER — Telehealth: Payer: Self-pay | Admitting: *Deleted

## 2015-03-07 MED ORDER — POTASSIUM CHLORIDE CRYS ER 20 MEQ PO TBCR: 20.0000 meq | EXTENDED_RELEASE_TABLET | Freq: Every day | ORAL | Status: DC

## 2015-03-07 NOTE — Telephone Encounter (Signed)
Pt aware of results 

## 2015-03-07 NOTE — Addendum Note (Signed)
Addended by: Dessa PhiFUNCHES, Doristine Shehan on: 03/07/2015 09:05 AM   Modules accepted: Orders

## 2015-03-07 NOTE — Telephone Encounter (Signed)
LVM to return call.

## 2015-03-07 NOTE — Telephone Encounter (Signed)
-----   Message from Dessa PhiJosalyn Funches, MD sent at 03/06/2015  1:57 PM EDT ----- Patient called  CXR revealed pneumonitis with is inflammation of the lungs. This can be due to viral or bacterial infection Since she was recently hospitalized, patient to take levaquin for 7 days

## 2015-03-07 NOTE — Telephone Encounter (Signed)
-----   Message from Dessa PhiJosalyn Funches, MD sent at 03/07/2015  9:03 AM EDT ----- Patient with low potassium will supplement Cr is low Liver enzymes are still elevated  Stable slightly elevated ammonia level

## 2015-03-07 NOTE — Assessment & Plan Note (Signed)
Persistent hypokalemia or recent CMP

## 2015-03-09 ENCOUNTER — Telehealth: Payer: Self-pay | Admitting: *Deleted

## 2015-03-09 LAB — URINE CULTURE

## 2015-03-09 MED ORDER — NITROFURANTOIN MONOHYD MACRO 100 MG PO CAPS: 100.0000 mg | ORAL_CAPSULE | Freq: Two times a day (BID) | ORAL | Status: DC

## 2015-03-09 NOTE — Telephone Encounter (Signed)
Pt aware.

## 2015-03-09 NOTE — Telephone Encounter (Signed)
-----   Message from Dessa Phi, MD sent at 03/09/2015  1:04 PM EDT ----- E coli in urine is resistant to levaquin macrobid re-ordered

## 2015-03-09 NOTE — Addendum Note (Signed)
Addended by: Dessa Phi on: 03/09/2015 01:05 PM   Modules accepted: Orders

## 2015-03-20 ENCOUNTER — Encounter: Payer: Self-pay | Admitting: Internal Medicine

## 2015-03-20 ENCOUNTER — Ambulatory Visit (INDEPENDENT_AMBULATORY_CARE_PROVIDER_SITE_OTHER): Payer: Medicaid Other | Admitting: Internal Medicine

## 2015-03-20 VITALS — BP 116/69 | HR 83 | Temp 98.6°F | Ht 65.0 in | Wt 107.0 lb

## 2015-03-20 DIAGNOSIS — K7460 Unspecified cirrhosis of liver: Secondary | ICD-10-CM

## 2015-03-20 DIAGNOSIS — B182 Chronic viral hepatitis C: Secondary | ICD-10-CM

## 2015-03-20 DIAGNOSIS — Z23 Encounter for immunization: Secondary | ICD-10-CM | POA: Diagnosis not present

## 2015-03-20 DIAGNOSIS — R188 Other ascites: Secondary | ICD-10-CM

## 2015-03-20 LAB — CBC WITH DIFFERENTIAL/PLATELET
BASOS ABS: 0 10*3/uL (ref 0.0–0.1)
BASOS PCT: 0 % (ref 0–1)
EOS ABS: 0.1 10*3/uL (ref 0.0–0.7)
Eosinophils Relative: 1 % (ref 0–5)
HCT: 27.4 % — ABNORMAL LOW (ref 36.0–46.0)
Hemoglobin: 9.3 g/dL — ABNORMAL LOW (ref 12.0–15.0)
LYMPHS ABS: 1.5 10*3/uL (ref 0.7–4.0)
Lymphocytes Relative: 17 % (ref 12–46)
MCH: 32.7 pg (ref 26.0–34.0)
MCHC: 33.9 g/dL (ref 30.0–36.0)
MCV: 96.5 fL (ref 78.0–100.0)
MONO ABS: 0.4 10*3/uL (ref 0.1–1.0)
MPV: 9.7 fL (ref 8.6–12.4)
Monocytes Relative: 4 % (ref 3–12)
NEUTROS PCT: 78 % — AB (ref 43–77)
Neutro Abs: 7.1 10*3/uL (ref 1.7–7.7)
Platelets: 265 10*3/uL (ref 150–400)
RBC: 2.84 MIL/uL — ABNORMAL LOW (ref 3.87–5.11)
RDW: 15 % (ref 11.5–15.5)
WBC: 9.1 10*3/uL (ref 4.0–10.5)

## 2015-03-20 LAB — COMPLETE METABOLIC PANEL WITH GFR
ALT: 40 U/L — AB (ref 6–29)
AST: 240 U/L — ABNORMAL HIGH (ref 10–30)
Albumin: 2.8 g/dL — ABNORMAL LOW (ref 3.6–5.1)
Alkaline Phosphatase: 147 U/L — ABNORMAL HIGH (ref 33–115)
BUN: 6 mg/dL — AB (ref 7–25)
CO2: 25 mmol/L (ref 20–31)
CREATININE: 0.41 mg/dL — AB (ref 0.50–1.10)
Calcium: 8.9 mg/dL (ref 8.6–10.2)
Chloride: 101 mmol/L (ref 98–110)
GFR, Est Non African American: >89 mL/min (ref >=60)
Glucose, Bld: 82 mg/dL (ref 65–99)
POTASSIUM: 3.8 mmol/L (ref 3.5–5.3)
SODIUM: 134 mmol/L — AB (ref 135–146)
TOTAL PROTEIN: 7.2 g/dL (ref 6.1–8.1)
Total Bilirubin: 7.1 mg/dL — ABNORMAL HIGH (ref 0.2–1.2)

## 2015-03-20 MED ORDER — LEDIPASVIR-SOFOSBUVIR 90-400 MG PO TABS
1.0000 | ORAL_TABLET | Freq: Every day | ORAL | Status: DC
Start: 2015-03-20 — End: 2015-03-26

## 2015-03-20 MED ORDER — RIBAVIRIN 200 MG PO TABS: ORAL_TABLET | ORAL | Status: DC

## 2015-03-20 NOTE — Progress Notes (Addendum)
Patient ID: Mary Garrison, female   DOB: 05/17/1991, 24 y.o.   MRN: [id number] +Mary Garrison is a 24 y.o. female who presents for follow up for hepatitis C.  She has a history of alcohol abuse and alcoholic hepatitis with acute renal failure and hospitalized in July for hepatorenal syndrome.  I saw her initially in February of 2016 for hepatitis C and she was still actively drinking, struggling with cessation.  I had her see our substance abuse counselor whom she has seen her several times.  She states she is now drug and alcohol free.  She is jaundiced with severe ascites.  Last drug and alcohol screen negative. She is Child-Pugh C, decompensated cirrhosis.  Has had pneumovax.  Here with her grandmother.    Patient does not have documented immunity to Hepatitis A. Patient does have documented immunity to Hepatitis B.     Review of Systems A comprehensive review of systems was negative.  No headaches  Past Medical History  Diagnosis Date  . Hepatitis C 11/2012    she suspects this was from a tattoo as she was dxd with Hep C before starting her IVDA.   Marland Kitchen Asthma     as a child  . Hypophosphatemia 06/29/2014    Phos of 1.4 > 4.0 with supplementation    . Hypokalemia 06/26/2014  . Alcohol addiction   . Polysubstance dependence     including IV heroin. inpt rehab 01/2012.   . C. difficile diarrhea 08/24/2014  . Tachycardia     Prior to Admission medications   Medication Sig Start Date End Date Taking? Authorizing Provider  albuterol (PROVENTIL HFA;VENTOLIN HFA) 108 (90 BASE) MCG/ACT inhaler Inhale 2 puffs into the lungs every 6 (six) hours as needed. For shortness of breath 06/21/14   Lora Paula, MD  ALPRAZolam (XANAX) 0.5 MG tablet Take 1 tablet (0.5 mg total) by mouth 2 (two) times daily as needed for anxiety. 10/09/14   Lora Paula, MD  calcium-vitamin D (OSCAL WITH D) 500-200 MG-UNIT per tablet Take 1 tablet by mouth 2 (two) times daily. 07/26/14   Lora Paula, MD   cholecalciferol (VITAMIN D) 1000 UNITS tablet Take 1 tablet (1,000 Units total) by mouth daily. 07/26/14   Josalyn C Funches, MD  Cyanocobalamin (B-12 PO) Take 1 tablet by mouth daily.    Historical Provider, MD  escitalopram (LEXAPRO) 20 MG tablet Take 1 tablet (20 mg total) by mouth daily. 10/09/14   Lora Paula, MD  folic acid (FOLVITE) 1 MG tablet Take 1 tablet (1 mg total) by mouth daily. 06/28/14   Lora Paula, MD  metoprolol tartrate (LOPRESSOR) 25 MG tablet Take 0.5 tablets (12.5 mg total) by mouth 2 (two) times daily. 08/24/14   Lora Paula, MD  Multiple Vitamins-Minerals (MULTIVITAMIN PO) Take 1 tablet by mouth daily.    Historical Provider, MD  potassium & sodium phosphates (PHOS-NAK) 280-160-250 MG PACK Take 1 packet by mouth 4 (four) times daily.    Historical Provider, MD  Pyridoxine HCl (B-6 PO) Take 1 tablet by mouth daily.    Historical Provider, MD  ranitidine (ZANTAC) 150 MG tablet Take 1 tablet (150 mg total) by mouth 2 (two) times daily as needed for heartburn. 10/09/14   Lora Paula, MD  Thiamine HCl (B-1 PO) Take 1 tablet by mouth daily.    Historical Provider, MD  traMADol (ULTRAM) 50 MG tablet Take 1 tablet (50 mg total) by mouth every 12 (twelve) hours as needed.  10/09/14   Lora Paula, MD  traZODone (DESYREL) 50 MG tablet Take 0.5-1 tablets (25-50 mg total) by mouth at bedtime as needed for sleep. 08/24/14   Lora Paula, MD    Allergies  Allergen Reactions  . Penicillins     unknown  . Sulfa Antibiotics     unknown    History  Substance Use Topics  . Smoking status: Current Every Day Smoker -- 0.50 packs/day for 10 years    Types: Cigarettes  . Smokeless tobacco: Never Used  . Alcohol Use: No     Comment: heavy drinking at times-- last drink last night-- no longer drinking every day    Family History  Problem Relation Age of Onset  . Asthma Mother   . Depression Brother   . Other Brother   . Depression Sister   . Other  Sister       Objective:   Filed Vitals:   03/20/15 1412  BP: 116/69  Pulse: 83  Temp: 98.6 F (37 C)   in no apparent distress and alert HEENT: anicteric Cor RRR and No murmurs clear Bowel sounds are normal, liver is not enlarged, spleen is not enlarged peripheral pulses normal, no pedal edema, no clubbing or cyanosis negative for - jaundice, spider hemangioma, telangiectasia, palmar erythema, ecchymosis and atrophy  Laboratory Genotype:  Lab Results  Component Value Date   HCVGENOTYPE 1b 09/06/2014   HCV viral load:  Lab Results  Component Value Date   HCVQUANT 80528* 09/06/2014   Lab Results  Component Value Date   WBC 9.2 02/19/2015   HGB 9.1* 02/19/2015   HCT 26.4* 02/19/2015   MCV 97.8 02/19/2015   PLT 208 02/19/2015    Lab Results  Component Value Date   CREATININE 0.31* 03/06/2015   BUN 4* 03/06/2015   NA 131* 03/06/2015   K 3.3* 03/06/2015   CL 97 03/06/2015   CO2 23 03/06/2015    Lab Results  Component Value Date   ALT 35 03/06/2015   AST 217* 03/06/2015   ALKPHOS 121* 03/06/2015   BILITOT 10.3* 03/06/2015   INR 1.50 03/06/2015      Assessment: Chronic Hepatitis C genotype 1b.  History of drug and alcohol abuse.  Seeing our counselor.  Recently hospitalized.  Recent drug screen and alcohol level negative.   Plan: -had her sign readiness form now that she is alcohol and drug free. -will prescribe Harvoni and 600 mg ribavirin -will discuss with GI if hepatology referral appropriate -Has GI follow up -seen by pharmacist as well -will need cbc 2 weeks after starting meds and cbc, hcv viral load and cmp after 4 weeks and follow up with me 5 weeks after starting.    ADDENDUM 03/23/2015: Reviewed recent labs. She has cleared her HCV virus spontaneously and no indication for treatment.  Patient was made aware and will continue to see our substance abuse counselor, otherwise can follow up with me PRN.

## 2015-03-20 NOTE — Progress Notes (Signed)
Patient ID: Gianina Olinde, female   DOB: 26-Jun-1991, 26 y.o.   MRN: [id number]  HPI: Jamile Rekowski is a 24 y.o. female who is here for her Hep C f/u.   Lab Results  Component Value Date   HCVGENOTYPE 1b 09/06/2014    Allergies: Allergies  Allergen Reactions  . Penicillins     unknown  . Sulfa Antibiotics     unknown    Vitals: Temp: 98.6 F (37 C) (08/02 1412) Temp Source: Oral (08/02 1412) BP: 116/69 mmHg (08/02 1412) Pulse Rate: 83 (08/02 1412)  Past Medical History: Past Medical History  Diagnosis Date  . Hepatitis C 11/2012    she suspects this was from a tattoo as she was dxd with Hep C before starting her IVDA.   Marland Kitchen Asthma     as a child  . Hypophosphatemia 06/29/2014    Phos of 1.4 > 4.0 with supplementation    . Hypokalemia 06/26/2014  . Alcohol addiction   . Polysubstance dependence     including IV heroin. inpt rehab 01/2012.   . C. difficile diarrhea 08/24/2014  . Tachycardia     Social History: History   Social History  . Marital Status: Single    Spouse Name: N/A  . Number of Children: N/A  . Years of Education: N/A   Social History Main Topics  . Smoking status: Current Every Day Smoker -- 0.50 packs/day for 10 years    Types: Cigarettes  . Smokeless tobacco: Never Used  . Alcohol Use: No     Comment: heavy drinking at times-- last drink last night-- no longer drinking every day  . Drug Use: No     Comment: none in 2.5 years   was opiates  . Sexual Activity: Not Currently    Birth Control/ Protection: None   Other Topics Concern  . None   Social History Narrative   Moved from [place] to [place], [place] in 02/2014.    Lives with grandmother, mother, daughter, sister, brother, mom's boyfriend.        Labs: HEP B S AB (no units)  Date Value  09/06/2014 POS*   HEPATITIS B SURFACE AG (no units)  Date Value  02/16/2015 Negative   HCV AB (s/co ratio)  Date Value  02/16/2015 >11.0*    Lab Results  Component Value Date   HCVGENOTYPE  1b 09/06/2014    Hepatitis C RNA quantitative Latest Ref Rng 09/06/2014 06/21/2014  HCV Quantitative <15 IU/mL 80528(H) <15  HCV Quantitative Log <1.18 log 10 4.91(H) <1.18    AST (U/L)  Date Value  03/06/2015 217*  02/19/2015 151*  02/18/2015 167*   ALT (U/L)  Date Value  03/06/2015 35  02/19/2015 54  02/18/2015 58*   INR (no units)  Date Value  03/06/2015 1.50  02/18/2015 1.69*  02/17/2015 1.72*  02/16/2015 1.44  02/13/2015 1.50    CrCl: Estimated Creatinine Clearance: 81.6 mL/min (by C-G formula based on Cr of 0.31).  Fibrosis Score: F4   Child-Pugh Score: Class C  Previous Treatment Regimen: None  Assessment: 24 yo who has had a hx of ETOH abuse and hep C. She was seen in 2/16 for an initial evaluation for treatment. She has had an extensive hx of ETOH abuse and has decompensated cirrhosis at this point. She has been seeing Jody for abuse counseling. She told me that she has not had ETOH for the past 3 months. She has 1b virus. Since she has decompensated cirrhosis and medicaid, she will  probably be approved for Harvoni. We'd then use it with ribavirin. Since her last VL was >6 mo, we'll need to get a recent one before submission to medicaid.  Recommendations: Harvoni 1 POqday Ribavirin  PO qday Waiting for VL before submission  Ulyses Southward [place], [place].D., BCPS, AAHIVP Clinical Infectious Disease Pharmacist [facility] 03/20/2015, 2:49 PM

## 2015-03-21 LAB — HCV RNA QUANT RFLX ULTRA OR GENOTYP: HCV Quantitative: NOT DETECTED IU/mL (ref <15)

## 2015-03-22 ENCOUNTER — Ambulatory Visit: Payer: Medicaid Other | Admitting: *Deleted

## 2015-03-22 ENCOUNTER — Encounter: Payer: Self-pay | Admitting: Gastroenterology

## 2015-03-22 ENCOUNTER — Ambulatory Visit (INDEPENDENT_AMBULATORY_CARE_PROVIDER_SITE_OTHER): Payer: Medicaid Other | Admitting: Gastroenterology

## 2015-03-22 VITALS — BP 84/50 | HR 80 | Ht 65.35 in | Wt 108.0 lb

## 2015-03-22 DIAGNOSIS — K7031 Alcoholic cirrhosis of liver with ascites: Secondary | ICD-10-CM | POA: Diagnosis not present

## 2015-03-22 DIAGNOSIS — K7010 Alcoholic hepatitis without ascites: Secondary | ICD-10-CM

## 2015-03-22 DIAGNOSIS — F1010 Alcohol abuse, uncomplicated: Secondary | ICD-10-CM

## 2015-03-22 DIAGNOSIS — B182 Chronic viral hepatitis C: Secondary | ICD-10-CM | POA: Diagnosis not present

## 2015-03-22 NOTE — Progress Notes (Signed)
_                                                                                                                History of Present Illness:  Mary Garrison is a 24 year old white female hospitalized in early July for acute alcoholic hepatitis complicated by renal insufficiency.  She has a history of IV drug abuse, alcoholism and hepatitis C.  She has started therapy for hepatitis C.  Since discharge she has been alcohol-free.  She complains of fatigue and persistent right upper quadrant discomfort.  CT scan demonstrated massive hepatomegaly with fatty infiltration and prominent vascular structures in the upper abdomen suggestive of varices.   Past Medical History  Diagnosis Date  . Hepatitis C 11/2012    she suspects this was from a tattoo as she was dxd with Hep C before starting her IVDA.   Marland Kitchen Asthma     as a child  . Hypophosphatemia 06/29/2014    Phos of 1.4 > 4.0 with supplementation    . Hypokalemia 06/26/2014  . Alcohol addiction   . Polysubstance dependence     including IV heroin. inpt rehab 01/2012.   . C. difficile diarrhea 08/24/2014  . Tachycardia   . Hemorrhoids    History reviewed. No pertinent past surgical history. family history includes Asthma in her mother; Depression in her brother and sister; Heart disease in her father and maternal grandmother; Liver cancer in her maternal grandfather; Lung cancer in her maternal grandfather; Other in her brother and sister; Pancreatic cancer in her maternal grandfather. Current Outpatient Prescriptions  Medication Sig Dispense Refill  . albuterol (PROVENTIL HFA;VENTOLIN HFA) 108 (90 BASE) MCG/ACT inhaler Inhale 2 puffs into the lungs every 6 (six) hours as needed. For shortness of breath 8 g 5  . calcium-vitamin D (OSCAL WITH D) 500-200 MG-UNIT per tablet Take 1 tablet by mouth 2 (two) times daily. 180 tablet 1  . cholecalciferol (VITAMIN D) 1000 UNITS tablet Take 1 tablet (1,000 Units total) by mouth daily. 90 tablet 3  .  Cyanocobalamin (B-12 PO) Take 1 tablet by mouth daily.    . Diclofenac Sodium 3 % GEL Place 1 application onto the skin as needed (for muscle pain). 100 g 0  . folic acid (FOLVITE) 1 MG tablet Take 1 tablet (1 mg total) by mouth daily. 90 tablet 3  . Ledipasvir-Sofosbuvir (HARVONI) 90-400 MG TABS Take 1 tablet by mouth daily. 28 tablet 2  . ondansetron (ZOFRAN ODT) 8 MG disintegrating tablet Take 1 tablet (8 mg total) by mouth every 8 (eight) hours as needed for nausea or vomiting. 30 tablet 2  . potassium chloride SA (K-DUR,KLOR-CON) 20 MEQ tablet Take 1 tablet (20 mEq total) by mouth daily. 30 tablet 0  . Pyridoxine HCl (B-6 PO) Take 1 tablet by mouth daily.    . ribavirin (COPEGUS) 200 MG tablet 600 mg daily 90 tablet 2  . Thiamine HCl (B-1 PO) Take 1 tablet by mouth daily.    . traMADol (ULTRAM) 50 MG  tablet Take 1 tablet (50 mg total) by mouth every 12 (twelve) hours as needed for moderate pain or severe pain. 60 tablet 1   No current facility-administered medications for this visit.   Allergies as of 03/22/2015 - Review Complete 03/22/2015  Allergen Reaction Noted  . Penicillins  02/12/2012  . Sulfa antibiotics  02/12/2012    reports that she has been smoking Cigarettes.  She has a 5 pack-year smoking history. She has never used smokeless tobacco. She reports that she does not drink alcohol or use illicit drugs.   Review of Systems: Pertinent positive and negative review of systems were noted in the above HPI section. All other review of systems were otherwise negative.  Vital signs were reviewed in today's medical record Physical Exam: General: Thin female in no acute distress Skin: anicteric Head: Normocephalic and atraumatic Eyes:  sclerae icteric, EOMI Ears: Normal auditory acuity Mouth: No deformity or lesions Neck: Supple, no masses or thyromegaly Lymph Nodes: no lymphadenopathy Lungs: Clear throughout to auscultation Heart: Regular rate and rhythm; no murmurs, rubs or  bruits Gastroinestinal: Liver is palpable 2 fingerbreadths below the right costal margin abdomen is protuberant Rectal:deferred Musculoskeletal: Symmetrical with no gross deformities  Skin: No lesions on visible extremities Pulses:  Normal pulses noted Extremities: No clubbing, cyanosis, edema or deformities noted Neurological: Alert oriented x 4, grossly nonfocal Cervical Nodes:  No significant cervical adenopathy Inguinal Nodes: No significant inguinal adenopathy Psychological:  Alert and cooperative. Normal mood and affect  See Assessment and Plan under Problem List

## 2015-03-22 NOTE — Patient Instructions (Signed)
You have been scheduled for an endoscopy. Please follow written instructions given to you at your visit today. If you use inhalers (even only as needed), please bring them with you on the day of your procedure. Your physician has requested that you go to www.startemmi.com and enter the access code given to you at your visit today. This web site gives a general overview about your procedure. However, you should still follow specific instructions given to you by our office regarding your preparation for the procedure.  You will follow up with labs and an office appointment in 3 months (CBC,INR,CMET)

## 2015-03-22 NOTE — Assessment & Plan Note (Signed)
Per Dr. Luciana Axe she is starting therapy with harvoni and ribavirin

## 2015-03-22 NOTE — Assessment & Plan Note (Signed)
LFTs are still abnormal.  She is in the recovery phase.

## 2015-03-22 NOTE — BH Specialist Note (Signed)
Mary Garrison was present for her scheduled appointment today with counselor.  Client brought her 26 year old son with her and it was slightly difficult to share openly and freely with client while her son was present.  Client was oriented times four with good affect and dress.  Client was alert but not very talkative.  Client was very pale but otherwise looked healthier than last appointment.  Client's tummy had gone down some.  Client shared that she is not using any substances and had remained clean since last appointment together. Client indicated that everything was going ok in her life and had no pressing issues other than not getting along with her son's father. Client did not provide much feedback but did answer questions accordingly.  Counselor encouraged client to stat the course and avoid any alcohol consumption.  Client made another appointment to check in with counselor in a month.   [name], LPCA, MA Alcohol and Drug Services

## 2015-03-22 NOTE — Assessment & Plan Note (Signed)
She is recovering from daily alcoholic hepatitis.  She likely has underlying cirrhosis as evidenced by hypoalbuminemia and small amount of ascites.  Prominent vascular structures suggest varices.  Recommendations #1 upper endoscopy #2 we had a 25 minute discussion regarding facts of alcohol on the liver, common a patient of alcohol and hepatitis C, and the state of her liver disease.  I explained that she has a well-established cirrhosis which will inevitably worsen if she continues to drink alcohol. #3 she was encouraged to maintain her nutrition

## 2015-03-26 ENCOUNTER — Telehealth: Payer: Self-pay | Admitting: *Deleted

## 2015-03-26 NOTE — Telephone Encounter (Signed)
-----   Message from Gardiner Barefoot, MD sent at 03/23/2015 11:13 AM EDT ----- Mary Garrison, can you let her know her HCV virus spontaneously cleared on its own and she is now HCV negative, so no indication for treatment.  It clears within the first year in about 20% of people acutely infected.  Thanks Rob

## 2015-03-26 NOTE — Telephone Encounter (Signed)
Relayed information to patient. She will continue to pursue treatment for cirrhosis. RN counseled and educated patient on risk reduction to avoid being reinfected.  She verbalized understanding and agreement.  Contacted [facility] to discontinue the medication request, removed Harvoni and Ribivirin from her medication list. Andree Coss, RN

## 2015-04-06 ENCOUNTER — Encounter (HOSPITAL_COMMUNITY): Payer: Self-pay | Admitting: *Deleted

## 2015-04-06 ENCOUNTER — Inpatient Hospital Stay (HOSPITAL_COMMUNITY)
Admission: EM | Admit: 2015-04-06 | Discharge: 2015-04-09 | DRG: 368 | Disposition: A | Discharge status: Home / Self Care | Payer: Medicaid Other | Attending: Internal Medicine | Admitting: Internal Medicine

## 2015-04-06 DIAGNOSIS — K7011 Alcoholic hepatitis with ascites: Secondary | ICD-10-CM | POA: Diagnosis present

## 2015-04-06 DIAGNOSIS — I9589 Other hypotension: Secondary | ICD-10-CM | POA: Diagnosis present

## 2015-04-06 DIAGNOSIS — R110 Nausea: Secondary | ICD-10-CM | POA: Diagnosis not present

## 2015-04-06 DIAGNOSIS — K7030 Alcoholic cirrhosis of liver without ascites: Secondary | ICD-10-CM | POA: Diagnosis not present

## 2015-04-06 DIAGNOSIS — K226 Gastro-esophageal laceration-hemorrhage syndrome: Principal | ICD-10-CM | POA: Diagnosis present

## 2015-04-06 DIAGNOSIS — K920 Hematemesis: Secondary | ICD-10-CM

## 2015-04-06 DIAGNOSIS — K859 Acute pancreatitis, unspecified: Secondary | ICD-10-CM | POA: Diagnosis present

## 2015-04-06 DIAGNOSIS — R070 Pain in throat: Secondary | ICD-10-CM | POA: Diagnosis present

## 2015-04-06 DIAGNOSIS — D62 Acute posthemorrhagic anemia: Secondary | ICD-10-CM | POA: Insufficient documentation

## 2015-04-06 DIAGNOSIS — K922 Gastrointestinal hemorrhage, unspecified: Secondary | ICD-10-CM | POA: Diagnosis present

## 2015-04-06 DIAGNOSIS — D689 Coagulation defect, unspecified: Secondary | ICD-10-CM | POA: Diagnosis present

## 2015-04-06 DIAGNOSIS — F17210 Nicotine dependence, cigarettes, uncomplicated: Secondary | ICD-10-CM | POA: Diagnosis present

## 2015-04-06 DIAGNOSIS — K7010 Alcoholic hepatitis without ascites: Secondary | ICD-10-CM | POA: Insufficient documentation

## 2015-04-06 DIAGNOSIS — Z8619 Personal history of other infectious and parasitic diseases: Secondary | ICD-10-CM

## 2015-04-06 DIAGNOSIS — F1020 Alcohol dependence, uncomplicated: Secondary | ICD-10-CM | POA: Diagnosis present

## 2015-04-06 DIAGNOSIS — Z880 Allergy status to penicillin: Secondary | ICD-10-CM

## 2015-04-06 DIAGNOSIS — R000 Tachycardia, unspecified: Secondary | ICD-10-CM | POA: Diagnosis present

## 2015-04-06 DIAGNOSIS — E876 Hypokalemia: Secondary | ICD-10-CM | POA: Diagnosis not present

## 2015-04-06 DIAGNOSIS — J45909 Unspecified asthma, uncomplicated: Secondary | ICD-10-CM | POA: Diagnosis present

## 2015-04-06 DIAGNOSIS — Z79899 Other long term (current) drug therapy: Secondary | ICD-10-CM

## 2015-04-06 DIAGNOSIS — D61818 Other pancytopenia: Secondary | ICD-10-CM | POA: Diagnosis present

## 2015-04-06 DIAGNOSIS — F10239 Alcohol dependence with withdrawal, unspecified: Secondary | ICD-10-CM | POA: Diagnosis present

## 2015-04-06 DIAGNOSIS — Z8719 Personal history of other diseases of the digestive system: Secondary | ICD-10-CM

## 2015-04-06 DIAGNOSIS — F1011 Alcohol abuse, in remission: Secondary | ICD-10-CM | POA: Diagnosis present

## 2015-04-06 DIAGNOSIS — Z882 Allergy status to sulfonamides status: Secondary | ICD-10-CM

## 2015-04-06 DIAGNOSIS — R748 Abnormal levels of other serum enzymes: Secondary | ICD-10-CM | POA: Diagnosis present

## 2015-04-06 DIAGNOSIS — K7031 Alcoholic cirrhosis of liver with ascites: Secondary | ICD-10-CM | POA: Diagnosis present

## 2015-04-06 LAB — COMPREHENSIVE METABOLIC PANEL
ALBUMIN: 3.7 g/dL (ref 3.5–5.0)
ALT: 86 U/L — AB (ref 14–54)
AST: 367 U/L — AB (ref 15–41)
Alkaline Phosphatase: 143 U/L — ABNORMAL HIGH (ref 38–126)
Anion gap: 16 — ABNORMAL HIGH (ref 5–15)
BUN: 7 mg/dL (ref 6–20)
CHLORIDE: 99 mmol/L — AB (ref 101–111)
CO2: 23 mmol/L (ref 22–32)
CREATININE: 0.52 mg/dL (ref 0.44–1.00)
Calcium: 9 mg/dL (ref 8.9–10.3)
GFR calc Af Amer: >60 mL/min (ref >60)
Glucose, Bld: 101 mg/dL — ABNORMAL HIGH (ref 65–99)
POTASSIUM: 3.2 mmol/L — AB (ref 3.5–5.1)
SODIUM: 138 mmol/L (ref 135–145)
Total Bilirubin: 8.6 mg/dL — ABNORMAL HIGH (ref 0.3–1.2)
Total Protein: 8.9 g/dL — ABNORMAL HIGH (ref 6.5–8.1)

## 2015-04-06 LAB — CBC WITH DIFFERENTIAL/PLATELET
BASOS PCT: 0 % (ref 0–1)
Basophils Absolute: 0 10*3/uL (ref 0.0–0.1)
EOS ABS: 0.2 10*3/uL (ref 0.0–0.7)
EOS PCT: 2 % (ref 0–5)
HCT: 27.1 % — ABNORMAL LOW (ref 36.0–46.0)
Hemoglobin: 9.1 g/dL — ABNORMAL LOW (ref 12.0–15.0)
Lymphocytes Relative: 28 % (ref 12–46)
Lymphs Abs: 1.7 10*3/uL (ref 0.7–4.0)
MCH: 32.2 pg (ref 26.0–34.0)
MCHC: 33.6 g/dL (ref 30.0–36.0)
MCV: 95.8 fL (ref 78.0–100.0)
MONO ABS: 0.2 10*3/uL (ref 0.1–1.0)
Monocytes Relative: 4 % (ref 3–12)
Neutro Abs: 4.1 10*3/uL (ref 1.7–7.7)
Neutrophils Relative %: 66 % (ref 43–77)
Platelets: 97 10*3/uL — ABNORMAL LOW (ref 150–400)
RBC: 2.83 MIL/uL — AB (ref 3.87–5.11)
RDW: 13.7 % (ref 11.5–15.5)
WBC: 6.2 10*3/uL (ref 4.0–10.5)

## 2015-04-06 LAB — URINALYSIS, ROUTINE W REFLEX MICROSCOPIC
Glucose, UA: NEGATIVE mg/dL
Hgb urine dipstick: NEGATIVE
Ketones, ur: NEGATIVE mg/dL
NITRITE: POSITIVE — AB
Protein, ur: 30 mg/dL — AB
SPECIFIC GRAVITY, URINE: 1.021 (ref 1.005–1.030)
UROBILINOGEN UA: 4 mg/dL — AB (ref 0.0–1.0)
pH: 7 (ref 5.0–8.0)

## 2015-04-06 LAB — LIPASE, BLOOD: Lipase: 108 U/L — ABNORMAL HIGH (ref 22–51)

## 2015-04-06 LAB — RAPID URINE DRUG SCREEN, HOSP PERFORMED
Amphetamines: NOT DETECTED
BARBITURATES: NOT DETECTED
Benzodiazepines: NOT DETECTED
COCAINE: NOT DETECTED
OPIATES: NOT DETECTED
Tetrahydrocannabinol: NOT DETECTED

## 2015-04-06 LAB — URINE MICROSCOPIC-ADD ON

## 2015-04-06 LAB — I-STAT BETA HCG BLOOD, ED (MC, WL, AP ONLY): I-stat hCG, quantitative: <5 m[IU]/mL (ref <5)

## 2015-04-06 LAB — ETHANOL: Alcohol, Ethyl (B): 422 mg/dL (ref <5)

## 2015-04-06 LAB — TYPE AND SCREEN
ABO/RH(D): O POS
ANTIBODY SCREEN: NEGATIVE

## 2015-04-06 LAB — AMMONIA: AMMONIA: 53 umol/L — AB (ref 9–35)

## 2015-04-06 LAB — PROTIME-INR
INR: 1.65 — ABNORMAL HIGH (ref 0.00–1.49)
Prothrombin Time: 19.5 seconds — ABNORMAL HIGH (ref 11.6–15.2)

## 2015-04-06 LAB — APTT: aPTT: 47 seconds — ABNORMAL HIGH (ref 24–37)

## 2015-04-06 MED ORDER — SODIUM CHLORIDE 0.9 % IV SOLN
INTRAVENOUS | Status: DC
  Administered 2015-04-06: 21:00:00 via INTRAVENOUS
  Administered 2015-04-07: 125 mL via INTRAVENOUS

## 2015-04-06 NOTE — ED Provider Notes (Signed)
CSN: [id number]     Arrival date & time 04/06/15  1946 History   First MD Initiated Contact with Patient 04/06/15 2014     Chief Complaint  Patient presents with  . Hematemesis  . Cirrhosis  . Alcohol Problem  . Abdominal Pain     (Consider location/radiation/quality/duration/timing/severity/associated sxs/prior Treatment) HPI Comments: Patient here complaining of hematemesis 1 day. Admits to alcohol abuse and drank 8 shots of liquor today. 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 She notes dark stools as well 2. Admits to weakness with near syncope. Denies any severe abdominal distention but does note some dyspnea. No recent heroin use. Symptoms have been progressively worse nothing makes them better. No treatment use prior to arrival  Patient is a 24 y.o. female presenting with alcohol problem and abdominal pain. The history is provided by the patient.  Alcohol Problem Associated symptoms include abdominal pain.  Abdominal Pain   Past Medical History  Diagnosis Date  . Hepatitis C 11/2012    she suspects this was from a tattoo as she was dxd with Hep C before starting her IVDA.   Marland Kitchen Asthma     as a child  . Hypophosphatemia 06/29/2014    Phos of 1.4 > 4.0 with supplementation    . Hypokalemia 06/26/2014  . Alcohol addiction   . Polysubstance dependence     including IV heroin. inpt rehab 01/2012.   . C. difficile diarrhea 08/24/2014  . Tachycardia   . Hemorrhoids    History reviewed. No pertinent past surgical history. Family History  Problem Relation Age of Onset  . Asthma Mother   . Depression Brother   . Other Brother   . Depression Sister   . Other Sister   . Liver cancer Maternal Grandfather   . Pancreatic cancer Maternal Grandfather   . Lung cancer Maternal Grandfather   . Heart disease Maternal Grandmother   . Heart disease Father    Social History  Substance Use Topics  . Smoking status: Current Every Day Smoker -- 0.50 packs/day for 10  years    Types: Cigarettes  . Smokeless tobacco: Never Used  . Alcohol Use: 0.0 oz/week    0 Standard drinks or equivalent per week     Comment: heavy drinking at times-- last drink last night-- no longer drinking every day   OB History    No data available     Review of Systems  Gastrointestinal: Positive for abdominal pain.  All other systems reviewed and are negative.     Allergies  Penicillins and Sulfa antibiotics  Home Medications   Prior to Admission medications   Medication Sig Start Date End Date Taking? Authorizing Provider  albuterol (PROVENTIL HFA;VENTOLIN HFA) 108 (90 BASE) MCG/ACT inhaler Inhale 2 puffs into the lungs every 6 (six) hours as needed. For shortness of breath 03/06/15   Dessa Phi, MD  calcium-vitamin D (OSCAL WITH D) 500-200 MG-UNIT per tablet Take 1 tablet by mouth 2 (two) times daily. 07/26/14   Dessa Phi, MD  cholecalciferol (VITAMIN D) 1000 UNITS tablet Take 1 tablet (1,000 Units total) by mouth daily. 07/26/14   Josalyn Funches, MD  Cyanocobalamin (B-12 PO) Take 1 tablet by mouth daily.    Historical Provider, MD  Diclofenac Sodium 3 % GEL Place 1 application onto the skin as needed (for muscle pain). 12/27/14   Dessa Phi, MD  folic acid (FOLVITE) 1 MG tablet Take 1 tablet (1 mg total) by mouth daily.  06/28/14   Josalyn Funches, MD  ondansetron (ZOFRAN ODT) 8 MG disintegrating tablet Take 1 tablet (8 mg total) by mouth every 8 (eight) hours as needed for nausea or vomiting. 03/06/15   Dessa Phi, MD  potassium chloride SA (K-DUR,KLOR-CON) 20 MEQ tablet Take 1 tablet (20 mEq total) by mouth daily. 03/07/15   Josalyn Funches, MD  Pyridoxine HCl (B-6 PO) Take 1 tablet by mouth daily.    Historical Provider, MD  Thiamine HCl (B-1 PO) Take 1 tablet by mouth daily.    Historical Provider, MD  traMADol (ULTRAM) 50 MG tablet Take 1 tablet (50 mg total) by mouth every 12 (twelve) hours as needed for moderate pain or severe pain. 03/06/15    Josalyn Funches, MD   BP 110/63 mmHg  Pulse 112  Temp(Src) 98.4 F (36.9 C) (Oral)  Resp 20  SpO2 96%  LMP 11/30/2014 (Within Days) Physical Exam  Constitutional: She is oriented to person, place, and time. She appears well-developed and well-nourished.  Non-toxic appearance. No distress.  HENT:  Head: Normocephalic and atraumatic.  Eyes: Conjunctivae and EOM are normal. Pupils are equal, round, and reactive to light. Scleral icterus is present.  Neck: Normal range of motion. Neck supple. No tracheal deviation present. No thyroid mass present.  Cardiovascular: Normal rate, regular rhythm and normal heart sounds.  Exam reveals no gallop.   No murmur heard. Pulmonary/Chest: Effort normal and breath sounds normal. No stridor. No respiratory distress. She has no decreased breath sounds. She has no wheezes. She has no rhonchi. She has no rales.  Abdominal: Soft. Normal appearance and bowel sounds are normal. She exhibits no distension. There is no tenderness. There is no rebound and no CVA tenderness.  Musculoskeletal: Normal range of motion. She exhibits no edema or tenderness.  Neurological: She is alert and oriented to person, place, and time. She has normal strength. No cranial nerve deficit or sensory deficit. GCS eye subscore is 4. GCS verbal subscore is 5. GCS motor subscore is 6.  Skin: No abrasion and no rash noted.  Jaundice noted  Psychiatric: She has a normal mood and affect. Her speech is normal and behavior is normal.  Nursing note and vitals reviewed.   ED Course  Procedures (including critical care time) Labs Review Labs Reviewed  CBC WITH DIFFERENTIAL/PLATELET  COMPREHENSIVE METABOLIC PANEL  PROTIME-INR  APTT  AMMONIA  ETHANOL  LIPASE, BLOOD  URINALYSIS, ROUTINE W REFLEX MICROSCOPIC (NOT AT [facility])  I-STAT BETA HCG BLOOD, ED ([facility], [facility], [facility] ONLY)    Imaging Review No results found. I have personally reviewed and evaluated these images and lab results as part of my  medical decision-making.   EKG Interpretation None      MDM   Final diagnoses:  None   Patient given IV fluids here Patient will admitted for treatment of her pancreatitis as well as worsening cirrhosis    Lorre Nick, MD 04/06/15 2149

## 2015-04-06 NOTE — ED Notes (Signed)
Bed: ZO10 Expected date:  Expected time:  Means of arrival:  Comments: EMS 46F emesis/hx of cirrhosis No IV/tachy

## 2015-04-06 NOTE — H&P (Signed)
PCP:  Lora Paula, MD    Referring provider Freida Busman   Chief Complaint: Vomiting of blood abdominal pain  HPI: Mary Garrison is a 24 y.o. female   has a past medical history of Hepatitis C (11/2012); Asthma; Hypophosphatemia (06/29/2014); Hypokalemia (06/26/2014); Alcohol addiction; Polysubstance dependence; C. difficile diarrhea (08/24/2014); Tachycardia; and Hemorrhoids.   Presented with a 24-hour history of intermittently vomiting blood. Patient has history of some alcoholic cirrhosis. In the past was told that she has hepatitis C but on recent testing apparently had spontaneously cleared it per infectious disease note. Patient was continued to drink. States that the only time she stopped drinking was during her prior hospitalization. Patient at this point is interested in quitting states she 'does not want to die".  Patient denies any blood in stool no melanoma. Reports right upper quadrant pain and tenderness. Reports easy bruising and bleeding. Patient is currently tremulous. Patient states that she never had history of seizures. She is followed by Dr. Arlyce Dice with GI In emergency department patient was noted to have mildly elevated lipase up to 108 INR 1.65 hemoglobin at  9.1 which is her baseline vital signs patient was transcended tachycardic up to 112 but currently down to 80s blood pressure stable 102/52 patient's reports chronic history of low blood pressures.   Hospitalist was called for admission for Hematemesis  Review of Systems:    Pertinent positives include:  Bright red blood per rectuM, hematemesis, abdominal pain, nausea, vomiting, chills,  Constitutional:  No weight loss, night sweats, Fevers,  fatigue, weight loss  HEENT:  No headaches, Difficulty swallowing,Tooth/dental problems,Sore throat,  No sneezing, itching, ear ache, nasal congestion, post nasal drip,  Cardio-vascular:  No chest pain, Orthopnea, PND, anasarca, dizziness, palpitations.no Bilateral lower  extremity swelling  GI:  No heartburn, indigestion, diarrhea, change in bowel habits, loss of appetite, melena, blood in stool, hematemesis Resp:  no shortness of breath at rest. No dyspnea on exertion, No excess mucus, no productive cough, No non-productive cough, No coughing up of blood.No change in color of mucus.No wheezing. Skin:  no rash or lesions. No jaundice GU:  no dysuria, change in color of urine, no urgency or frequency. No straining to urinate.  No flank pain.  Musculoskeletal:  No joint pain or no joint swelling. No decreased range of motion. No back pain.  Psych:  No change in mood or affect. No depression or anxiety. No memory loss.  Neuro: no localizing neurological complaints, no tingling, no weakness, no double vision, no gait abnormality, no slurred speech, no confusion  Otherwise ROS are negative except for above, 10 systems were reviewed  Past Medical History: Past Medical History  Diagnosis Date  . Hepatitis C 11/2012    she suspects this was from a tattoo as she was dxd with Hep C before starting her IVDA.   Marland Kitchen Asthma     as a child  . Hypophosphatemia 06/29/2014    Phos of 1.4 > 4.0 with supplementation    . Hypokalemia 06/26/2014  . Alcohol addiction   . Polysubstance dependence     including IV heroin. inpt rehab 01/2012.   . C. difficile diarrhea 08/24/2014  . Tachycardia   . Hemorrhoids    History reviewed. No pertinent past surgical history.   Medications: Prior to Admission medications   Medication Sig Start Date End Date Taking? Authorizing Provider  albuterol (PROVENTIL HFA;VENTOLIN HFA) 108 (90 BASE) MCG/ACT inhaler Inhale 2 puffs into the lungs every 6 (six) hours as  needed. For shortness of breath 03/06/15  Yes Josalyn Funches, MD  calcium-vitamin D (OSCAL WITH D) 500-200 MG-UNIT per tablet Take 1 tablet by mouth 2 (two) times daily. 07/26/14  Yes Josalyn Funches, MD  Cyanocobalamin (B-12 PO) Take 1 tablet by mouth daily.   Yes Historical  Provider, MD  folic acid (FOLVITE) 1 MG tablet Take 1 tablet (1 mg total) by mouth daily. 06/28/14  Yes Josalyn Funches, MD  potassium chloride SA (K-DUR,KLOR-CON) 20 MEQ tablet Take 1 tablet (20 mEq total) by mouth daily. 03/07/15  Yes Josalyn Funches, MD  Pyridoxine HCl (B-6 PO) Take 1 tablet by mouth daily.   Yes Historical Provider, MD  Thiamine HCl (B-1 PO) Take 1 tablet by mouth daily.   Yes Historical Provider, MD  traMADol (ULTRAM) 50 MG tablet Take 1 tablet (50 mg total) by mouth every 12 (twelve) hours as needed for moderate pain or severe pain. 03/06/15  Yes Josalyn Funches, MD  cholecalciferol (VITAMIN D) 1000 UNITS tablet Take 1 tablet (1,000 Units total) by mouth daily. Patient not taking: Reported on 04/06/2015 07/26/14   Dessa Phi, MD  Diclofenac Sodium 3 % GEL Place 1 application onto the skin as needed (for muscle pain). Patient not taking: Reported on 04/06/2015 12/27/14   Dessa Phi, MD  ondansetron (ZOFRAN ODT) 8 MG disintegrating tablet Take 1 tablet (8 mg total) by mouth every 8 (eight) hours as needed for nausea or vomiting. Patient not taking: Reported on 04/06/2015 03/06/15   Dessa Phi, MD    Allergies:   Allergies  Allergen Reactions  . Penicillins     unknown  . Sulfa Antibiotics     unknown    Social History:  Ambulatory   independently   Lives at home  With family     reports that she has been smoking Cigarettes.  She has a 5 pack-year smoking history. She has never used smokeless tobacco. She reports that she drinks alcohol. She reports that she does not use illicit drugs.    Family History: family history includes Asthma in her mother; Depression in her brother and sister; Heart disease in her father and maternal grandmother; Liver cancer in her maternal grandfather; Lung cancer in her maternal grandfather; Other in her brother and sister; Pancreatic cancer in her maternal grandfather.    Physical Exam: Patient Vitals for the past 24  hrs:  BP Temp Temp src Pulse Resp SpO2  04/06/15 2247 123/63 mmHg - - 99 20 97 %  04/06/15 2151 96/55 mmHg - - 88 20 96 %  04/06/15 2007 110/63 mmHg 98.4 F (36.9 C) Oral 112 20 96 %    1. General:  in No Acute distress 2. Psychological: Alert and  Oriented 3. Head/ENT:   Dry Mucous Membranes                          Head Non traumatic, neck supple                          Normal  Dentition 4. SKIN: decreased Skin turgor,  Skin clean Dry and intact no rash icteric 5. Heart: Regular rate and rhythm no Murmur, Rub or gallop 6. Lungs: Clear to auscultation bilaterally, no wheezes or crackles   7. Abdomen: Soft, right upper quadrant tenderness no epigastric tenderness, Non distended 8. Lower extremities: no clubbing, cyanosis, or edema 9. Neurologically Grossly intact, moving all 4 extremities equally 10. MSK:  Normal range of motion  body mass index is unknown because there is no weight on file.   Labs on Admission:   Results for orders placed or performed during the hospital encounter of 04/06/15 (from the past 24 hour(s))  I-Stat beta hCG blood, ED ([facility], [facility], [facility] only)     Status: None   Collection Time: 04/06/15  8:57 PM  Result Value Ref Range   I-stat hCG, quantitative <5.0 <5 mIU/mL   Comment 3          Type and screen     Status: None   Collection Time: 04/06/15  9:01 PM  Result Value Ref Range   ABO/RH(D) O POS    Antibody Screen NEG    Sample Expiration 04/09/2015   Ammonia     Status: Abnormal   Collection Time: 04/06/15  9:11 PM  Result Value Ref Range   Ammonia 53 (H) 9 - 35 umol/L  Ethanol     Status: Abnormal   Collection Time: 04/06/15  9:11 PM  Result Value Ref Range   Alcohol, Ethyl (B) 422 (HH) <5 mg/dL  CBC with Differential/Platelet     Status: Abnormal   Collection Time: 04/06/15  9:12 PM  Result Value Ref Range   WBC 6.2 4.0 - 10.5 K/uL   RBC 2.83 (L) 3.87 - 5.11 MIL/uL   Hemoglobin 9.1 (L) 12.0 - 15.0 g/dL   HCT 16.1 (L) 09.6 - 04.5 %   MCV 95.8  78.0 - 100.0 fL   MCH 32.2 26.0 - 34.0 pg   MCHC 33.6 30.0 - 36.0 g/dL   RDW 40.9 81.1 - 91.4 %   Platelets 97 (L) 150 - 400 K/uL   Neutrophils Relative % 66 43 - 77 %   Neutro Abs 4.1 1.7 - 7.7 K/uL   Lymphocytes Relative 28 12 - 46 %   Lymphs Abs 1.7 0.7 - 4.0 K/uL   Monocytes Relative 4 3 - 12 %   Monocytes Absolute 0.2 0.1 - 1.0 K/uL   Eosinophils Relative 2 0 - 5 %   Eosinophils Absolute 0.2 0.0 - 0.7 K/uL   Basophils Relative 0 0 - 1 %   Basophils Absolute 0.0 0.0 - 0.1 K/uL  Comprehensive metabolic panel     Status: Abnormal   Collection Time: 04/06/15  9:12 PM  Result Value Ref Range   Sodium 138 135 - 145 mmol/L   Potassium 3.2 (L) 3.5 - 5.1 mmol/L   Chloride 99 (L) 101 - 111 mmol/L   CO2 23 22 - 32 mmol/L   Glucose, Bld 101 (H) 65 - 99 mg/dL   BUN 7 6 - 20 mg/dL   Creatinine, Ser 7.82 0.44 - 1.00 mg/dL   Calcium 9.0 8.9 - 95.6 mg/dL   Total Protein 8.9 (H) 6.5 - 8.1 g/dL   Albumin 3.7 3.5 - 5.0 g/dL   AST 213 (H) 15 - 41 U/L   ALT 86 (H) 14 - 54 U/L   Alkaline Phosphatase 143 (H) 38 - 126 U/L   Total Bilirubin 8.6 (H) 0.3 - 1.2 mg/dL   GFR calc non Af Amer >60 >60 mL/min   GFR calc Af Amer >60 >60 mL/min   Anion gap 16 (H) 5 - 15  Protime-INR     Status: Abnormal   Collection Time: 04/06/15  9:12 PM  Result Value Ref Range   Prothrombin Time 19.5 (H) 11.6 - 15.2 seconds   INR 1.65 (H) 0.00 - 1.49  APTT  Status: Abnormal   Collection Time: 04/06/15  9:12 PM  Result Value Ref Range   aPTT 47 (H) 24 - 37 seconds  Lipase, blood     Status: Abnormal   Collection Time: 04/06/15  9:12 PM  Result Value Ref Range   Lipase 108 (H) 22 - 51 U/L  Urinalysis, Routine w reflex microscopic (not at [facility])     Status: Abnormal   Collection Time: 04/06/15 10:42 PM  Result Value Ref Range   Color, Urine ORANGE (A) YELLOW   APPearance TURBID (A) CLEAR   Specific Gravity, Urine 1.021 1.005 - 1.030   pH 7.0 5.0 - 8.0   Glucose, UA NEGATIVE NEGATIVE mg/dL   Hgb urine  dipstick NEGATIVE NEGATIVE   Bilirubin Urine MODERATE (A) NEGATIVE   Ketones, ur NEGATIVE NEGATIVE mg/dL   Protein, ur 30 (A) NEGATIVE mg/dL   Urobilinogen, UA 4.0 (H) 0.0 - 1.0 mg/dL   Nitrite POSITIVE (A) NEGATIVE   Leukocytes, UA SMALL (A) NEGATIVE  Urine microscopic-add on     Status: Abnormal   Collection Time: 04/06/15 10:42 PM  Result Value Ref Range   Squamous Epithelial / LPF MANY (A) RARE   WBC, UA 0-2 <3 WBC/hpf   Bacteria, UA MANY (A) RARE   Casts HYALINE CASTS (A) NEGATIVE   Urine-Other AMORPHOUS URATES/PHOSPHATES     UA bacteria but no WBC  No results found for: HGBA1C  CrCl cannot be calculated (Unknown ideal weight.).  BNP (last 3 results) No results for input(s): PROBNP in the last 8760 hours.  Other results:  I have pearsonaly reviewed this: ECG REPORT not obtained   There were no vitals filed for this visit.   Cultures:    Component Value Date/Time   SDES BLOOD LEFT HAND 02/16/2015 1507   SPECREQUEST BOTTLES DRAWN AEROBIC ONLY 1CC 02/16/2015 1507   CULT NO GROWTH 5 DAYS 02/16/2015 1507   REPTSTATUS 02/21/2015 FINAL 02/16/2015 1507     Radiological Exams on Admission: No results found.  Chart has been reviewed  Family  at  Bedside  plan of care was discussed with Armen Pickup [phone] Assessment/Plan  24 year old female history of alcohol abuse with alcoholic cirrhosis admitted with hematemesis   Present on Admission:  . Alcohol abuse - CIWA protocol discussed with patient the significance of her liver disease. Patient strongly encouraged to discontinue his drinking under medical supervision  . Asthma continue albuterol as needed  . GI bleed hematemesis. Patient was supposed to undergo endoscopy but I believe it hasn't been done yet. In the past though suspicion that she may have very ceased. We'll start on Protonix and octreotide drip. Administer IV fluids monitor and step down discussed patient with GI also recommended  addition of Fortaz. We will monitor CBC  . Hypokalemia mild will replace Elevated lipase in the setting of ongoing alcohol abuse. - We will repeat in the a.m. At this point no epigastric tenderness to suggest pancreatitis. Will keep nothing by mouth and if continues to have rising lipase consider CT image of abdomen to evaluate further  Prophylaxis: SCD   CODE STATUS:  FULL CODE  as per patient    Disposition:  To home once workup is complete and patient is stable  Other plan as per orders.  I have spent a total of 65 min on this admission extra time was taken to discuss case with Dr. Marijean Niemann 04/06/2015, 11:15 PM  [facility]  Pager [phone]   after 2  AM please page floor coverage PA If 7AM-7PM, please contact the day team taking care of the patient  [email or web address]  Password [email or web address]

## 2015-04-06 NOTE — ED Notes (Signed)
Pt states she has been vomiting small amounts of blood today since 10 am,  History of alcohol abuse which she admits to drinking vodka today,  Hx of cirrhosis and heroin abuse,  Hep C

## 2015-04-06 NOTE — ED Notes (Signed)
IV team at bedside 

## 2015-04-06 NOTE — ED Notes (Signed)
Pt attempting to go outside to smoke. Pt told she couldn't go out with an IV.

## 2015-04-07 ENCOUNTER — Encounter (HOSPITAL_COMMUNITY): Payer: Self-pay | Admitting: Gastroenterology

## 2015-04-07 ENCOUNTER — Inpatient Hospital Stay (HOSPITAL_COMMUNITY): Payer: Medicaid Other | Admitting: Anesthesiology

## 2015-04-07 ENCOUNTER — Encounter (HOSPITAL_COMMUNITY)
Admission: EM | Disposition: A | Discharge status: Home / Self Care | Payer: Self-pay | Source: Home / Self Care | Attending: Internal Medicine

## 2015-04-07 DIAGNOSIS — E876 Hypokalemia: Secondary | ICD-10-CM | POA: Diagnosis present

## 2015-04-07 DIAGNOSIS — K7030 Alcoholic cirrhosis of liver without ascites: Secondary | ICD-10-CM | POA: Insufficient documentation

## 2015-04-07 DIAGNOSIS — K7011 Alcoholic hepatitis with ascites: Secondary | ICD-10-CM | POA: Diagnosis present

## 2015-04-07 DIAGNOSIS — F1020 Alcohol dependence, uncomplicated: Secondary | ICD-10-CM | POA: Diagnosis present

## 2015-04-07 DIAGNOSIS — K920 Hematemesis: Secondary | ICD-10-CM | POA: Insufficient documentation

## 2015-04-07 DIAGNOSIS — D689 Coagulation defect, unspecified: Secondary | ICD-10-CM | POA: Diagnosis present

## 2015-04-07 DIAGNOSIS — Z880 Allergy status to penicillin: Secondary | ICD-10-CM | POA: Diagnosis not present

## 2015-04-07 DIAGNOSIS — F1010 Alcohol abuse, uncomplicated: Secondary | ICD-10-CM | POA: Diagnosis not present

## 2015-04-07 DIAGNOSIS — F17210 Nicotine dependence, cigarettes, uncomplicated: Secondary | ICD-10-CM | POA: Diagnosis present

## 2015-04-07 DIAGNOSIS — K922 Gastrointestinal hemorrhage, unspecified: Secondary | ICD-10-CM | POA: Diagnosis not present

## 2015-04-07 DIAGNOSIS — J45909 Unspecified asthma, uncomplicated: Secondary | ICD-10-CM | POA: Diagnosis present

## 2015-04-07 DIAGNOSIS — R000 Tachycardia, unspecified: Secondary | ICD-10-CM | POA: Diagnosis present

## 2015-04-07 DIAGNOSIS — K7010 Alcoholic hepatitis without ascites: Secondary | ICD-10-CM | POA: Diagnosis not present

## 2015-04-07 DIAGNOSIS — D62 Acute posthemorrhagic anemia: Secondary | ICD-10-CM | POA: Diagnosis present

## 2015-04-07 DIAGNOSIS — I9589 Other hypotension: Secondary | ICD-10-CM | POA: Diagnosis present

## 2015-04-07 DIAGNOSIS — Z79899 Other long term (current) drug therapy: Secondary | ICD-10-CM | POA: Diagnosis not present

## 2015-04-07 DIAGNOSIS — R070 Pain in throat: Secondary | ICD-10-CM | POA: Diagnosis present

## 2015-04-07 DIAGNOSIS — K859 Acute pancreatitis, unspecified: Secondary | ICD-10-CM | POA: Diagnosis present

## 2015-04-07 DIAGNOSIS — K226 Gastro-esophageal laceration-hemorrhage syndrome: Secondary | ICD-10-CM | POA: Diagnosis present

## 2015-04-07 DIAGNOSIS — R748 Abnormal levels of other serum enzymes: Secondary | ICD-10-CM | POA: Diagnosis present

## 2015-04-07 DIAGNOSIS — Z882 Allergy status to sulfonamides status: Secondary | ICD-10-CM | POA: Diagnosis not present

## 2015-04-07 DIAGNOSIS — F10239 Alcohol dependence with withdrawal, unspecified: Secondary | ICD-10-CM | POA: Diagnosis present

## 2015-04-07 DIAGNOSIS — D61818 Other pancytopenia: Secondary | ICD-10-CM | POA: Diagnosis present

## 2015-04-07 DIAGNOSIS — K7031 Alcoholic cirrhosis of liver with ascites: Secondary | ICD-10-CM | POA: Diagnosis present

## 2015-04-07 DIAGNOSIS — Z8619 Personal history of other infectious and parasitic diseases: Secondary | ICD-10-CM | POA: Diagnosis not present

## 2015-04-07 DIAGNOSIS — Z8719 Personal history of other diseases of the digestive system: Secondary | ICD-10-CM

## 2015-04-07 HISTORY — PX: ESOPHAGOGASTRODUODENOSCOPY: SHX5428

## 2015-04-07 LAB — COMPREHENSIVE METABOLIC PANEL
ALBUMIN: 3.2 g/dL — AB (ref 3.5–5.0)
ALK PHOS: 125 U/L (ref 38–126)
ALT: 72 U/L — ABNORMAL HIGH (ref 14–54)
ALT: 77 U/L — AB (ref 14–54)
ANION GAP: 15 (ref 5–15)
AST: 292 U/L — AB (ref 15–41)
AST: 301 U/L — AB (ref 15–41)
Albumin: 3.5 g/dL (ref 3.5–5.0)
Alkaline Phosphatase: 113 U/L (ref 38–126)
Anion gap: 14 (ref 5–15)
BILIRUBIN TOTAL: 7.6 mg/dL — AB (ref 0.3–1.2)
BUN: 7 mg/dL (ref 6–20)
BUN: 6 mg/dL (ref 6–20)
CALCIUM: 8.2 mg/dL — AB (ref 8.9–10.3)
CHLORIDE: 100 mmol/L — AB (ref 101–111)
CO2: 18 mmol/L — AB (ref 22–32)
CO2: 19 mmol/L — ABNORMAL LOW (ref 22–32)
CREATININE: 0.48 mg/dL (ref 0.44–1.00)
Calcium: 8.3 mg/dL — ABNORMAL LOW (ref 8.9–10.3)
Chloride: 102 mmol/L (ref 101–111)
Creatinine, Ser: 0.41 mg/dL — ABNORMAL LOW (ref 0.44–1.00)
GFR calc Af Amer: >60 mL/min (ref >60)
GFR calc Af Amer: >60 mL/min (ref >60)
GFR calc non Af Amer: >60 mL/min (ref >60)
GLUCOSE: 64 mg/dL — AB (ref 65–99)
Glucose, Bld: 77 mg/dL (ref 65–99)
POTASSIUM: 4.4 mmol/L (ref 3.5–5.1)
Potassium: 3.6 mmol/L (ref 3.5–5.1)
SODIUM: 135 mmol/L (ref 135–145)
Sodium: 133 mmol/L — ABNORMAL LOW (ref 135–145)
Total Bilirubin: 7.3 mg/dL — ABNORMAL HIGH (ref 0.3–1.2)
Total Protein: 7.7 g/dL (ref 6.5–8.1)
Total Protein: 8.3 g/dL — ABNORMAL HIGH (ref 6.5–8.1)

## 2015-04-07 LAB — CBC
HCT: 25.2 % — ABNORMAL LOW (ref 36.0–46.0)
HEMATOCRIT: 23.9 % — AB (ref 36.0–46.0)
HEMOGLOBIN: 8 g/dL — AB (ref 12.0–15.0)
Hemoglobin: 8.1 g/dL — ABNORMAL LOW (ref 12.0–15.0)
MCH: 31.9 pg (ref 26.0–34.0)
MCH: 31.2 pg (ref 26.0–34.0)
MCHC: 33.5 g/dL (ref 30.0–36.0)
MCHC: 32.1 g/dL (ref 30.0–36.0)
MCV: 95.2 fL (ref 78.0–100.0)
MCV: 96.9 fL (ref 78.0–100.0)
PLATELETS: 75 10*3/uL — AB (ref 150–400)
PLATELETS: 79 10*3/uL — AB (ref 150–400)
RBC: 2.51 MIL/uL — AB (ref 3.87–5.11)
RBC: 2.6 MIL/uL — ABNORMAL LOW (ref 3.87–5.11)
RDW: 13.9 % (ref 11.5–15.5)
RDW: 13.9 % (ref 11.5–15.5)
WBC: 3.8 10*3/uL — AB (ref 4.0–10.5)
WBC: 4 10*3/uL (ref 4.0–10.5)

## 2015-04-07 LAB — PROTIME-INR
INR: 1.8 — ABNORMAL HIGH (ref 0.00–1.49)
Prothrombin Time: 20.9 seconds — ABNORMAL HIGH (ref 11.6–15.2)

## 2015-04-07 LAB — LIPASE, BLOOD: LIPASE: 62 U/L — AB (ref 22–51)

## 2015-04-07 LAB — TSH: TSH: 0.903 u[IU]/mL (ref 0.350–4.500)

## 2015-04-07 LAB — ABO/RH: ABO/RH(D): O POS

## 2015-04-07 LAB — PHOSPHORUS: Phosphorus: 3.8 mg/dL (ref 2.5–4.6)

## 2015-04-07 LAB — MAGNESIUM: Magnesium: 1.4 mg/dL — ABNORMAL LOW (ref 1.7–2.4)

## 2015-04-07 LAB — MRSA PCR SCREENING: MRSA BY PCR: NEGATIVE

## 2015-04-07 LAB — PREGNANCY, URINE: PREG TEST UR: NEGATIVE

## 2015-04-07 SURGERY — EGD (ESOPHAGOGASTRODUODENOSCOPY)
Anesthesia: General

## 2015-04-07 SURGERY — EGD (ESOPHAGOGASTRODUODENOSCOPY)
Anesthesia: Moderate Sedation

## 2015-04-07 MED ORDER — DEXTROSE 5 % IV SOLN
1.0000 g | Freq: Three times a day (TID) | INTRAVENOUS | Status: DC
  Administered 2015-04-07 – 2015-04-09 (×8): 1 g via INTRAVENOUS
  Filled 2015-04-07 (×11): qty 1

## 2015-04-07 MED ORDER — SODIUM CHLORIDE 0.9 % IV SOLN
8.0000 mg/h | INTRAVENOUS | Status: DC
  Administered 2015-04-07 – 2015-04-09 (×5): 8 mg/h via INTRAVENOUS
  Filled 2015-04-07 (×11): qty 80

## 2015-04-07 MED ORDER — ALBUTEROL SULFATE HFA 108 (90 BASE) MCG/ACT IN AERS: 2.0000 | INHALATION_SPRAY | Freq: Four times a day (QID) | RESPIRATORY_TRACT | Status: DC | PRN

## 2015-04-07 MED ORDER — SODIUM CHLORIDE 0.9 % IV SOLN: INTRAVENOUS | Status: DC

## 2015-04-07 MED ORDER — PROPOFOL 10 MG/ML IV BOLUS
INTRAVENOUS | Status: AC
  Filled 2015-04-07: qty 20

## 2015-04-07 MED ORDER — ALBUTEROL SULFATE (2.5 MG/3ML) 0.083% IN NEBU: 2.5000 mg | INHALATION_SOLUTION | Freq: Four times a day (QID) | RESPIRATORY_TRACT | Status: DC | PRN

## 2015-04-07 MED ORDER — SODIUM CHLORIDE 0.9 % IV SOLN
80.0000 mg | Freq: Once | INTRAVENOUS | Status: AC
  Administered 2015-04-07: 80 mg via INTRAVENOUS
  Filled 2015-04-07: qty 80

## 2015-04-07 MED ORDER — LACTATED RINGERS IV SOLN
INTRAVENOUS | Status: DC | PRN
  Administered 2015-04-07 (×2): via INTRAVENOUS

## 2015-04-07 MED ORDER — PANTOPRAZOLE SODIUM 40 MG IV SOLR: 40.0000 mg | Freq: Two times a day (BID) | INTRAVENOUS | Status: DC

## 2015-04-07 MED ORDER — FENTANYL CITRATE (PF) 100 MCG/2ML IJ SOLN
25.0000 ug | INTRAMUSCULAR | Status: DC | PRN
  Administered 2015-04-09: 50 ug via INTRAVENOUS
  Filled 2015-04-07: qty 2

## 2015-04-07 MED ORDER — SUCCINYLCHOLINE CHLORIDE 20 MG/ML IJ SOLN
INTRAMUSCULAR | Status: DC | PRN
  Administered 2015-04-07: 80 mg via INTRAVENOUS

## 2015-04-07 MED ORDER — NICOTINE 21 MG/24HR TD PT24: 21.0000 mg | MEDICATED_PATCH | Freq: Every day | TRANSDERMAL | Status: DC

## 2015-04-07 MED ORDER — ONDANSETRON HCL 4 MG/2ML IJ SOLN
4.0000 mg | Freq: Four times a day (QID) | INTRAMUSCULAR | Status: DC | PRN
  Administered 2015-04-07 (×2): 4 mg via INTRAVENOUS
  Filled 2015-04-07: qty 2

## 2015-04-07 MED ORDER — FOLIC ACID 5 MG/ML IJ SOLN
1.0000 mg | Freq: Every day | INTRAMUSCULAR | Status: DC
  Administered 2015-04-07 – 2015-04-09 (×3): 1 mg via INTRAVENOUS
  Filled 2015-04-07 (×4): qty 0.2

## 2015-04-07 MED ORDER — FENTANYL CITRATE (PF) 100 MCG/2ML IJ SOLN
INTRAMUSCULAR | Status: AC
  Filled 2015-04-07: qty 4

## 2015-04-07 MED ORDER — ONDANSETRON HCL 4 MG PO TABS: 4.0000 mg | ORAL_TABLET | Freq: Four times a day (QID) | ORAL | Status: DC | PRN

## 2015-04-07 MED ORDER — MIDAZOLAM HCL 2 MG/2ML IJ SOLN
INTRAMUSCULAR | Status: AC
  Filled 2015-04-07: qty 4

## 2015-04-07 MED ORDER — LACTATED RINGERS IV SOLN
INTRAVENOUS | Status: DC
  Administered 2015-04-07: 17:00:00 via INTRAVENOUS

## 2015-04-07 MED ORDER — FENTANYL CITRATE (PF) 100 MCG/2ML IJ SOLN
INTRAMUSCULAR | Status: DC | PRN
  Administered 2015-04-07: 100 ug via INTRAVENOUS

## 2015-04-07 MED ORDER — OCTREOTIDE LOAD VIA INFUSION
50.0000 ug | Freq: Once | INTRAVENOUS | Status: AC
  Administered 2015-04-07: 50 ug via INTRAVENOUS
  Filled 2015-04-07: qty 25

## 2015-04-07 MED ORDER — SODIUM CHLORIDE 0.9 % IV SOLN
50.0000 ug/h | INTRAVENOUS | Status: DC
  Administered 2015-04-07 (×2): 50 ug/h via INTRAVENOUS
  Filled 2015-04-07 (×4): qty 1

## 2015-04-07 MED ORDER — LORAZEPAM 2 MG/ML IJ SOLN
2.0000 mg | INTRAMUSCULAR | Status: DC | PRN
  Administered 2015-04-07 – 2015-04-09 (×9): 2 mg via INTRAVENOUS
  Filled 2015-04-07 (×9): qty 1

## 2015-04-07 MED ORDER — PHENYLEPHRINE HCL 10 MG/ML IJ SOLN
INTRAMUSCULAR | Status: DC | PRN
  Administered 2015-04-07 (×2): 120 ug via INTRAVENOUS

## 2015-04-07 MED ORDER — NICOTINE 7 MG/24HR TD PT24
7.0000 mg | MEDICATED_PATCH | Freq: Every day | TRANSDERMAL | Status: DC
  Administered 2015-04-07 – 2015-04-09 (×3): 7 mg via TRANSDERMAL
  Filled 2015-04-07 (×3): qty 1

## 2015-04-07 MED ORDER — MIDAZOLAM HCL 5 MG/5ML IJ SOLN
INTRAMUSCULAR | Status: DC | PRN
  Administered 2015-04-07: 2 mg via INTRAVENOUS

## 2015-04-07 MED ORDER — SODIUM CHLORIDE 0.9 % IJ SOLN
3.0000 mL | Freq: Two times a day (BID) | INTRAMUSCULAR | Status: DC
  Administered 2015-04-07 – 2015-04-08 (×2): 3 mL via INTRAVENOUS

## 2015-04-07 MED ORDER — PROPOFOL 500 MG/50ML IV EMUL
INTRAVENOUS | Status: DC | PRN
  Administered 2015-04-07: 120 mg via INTRAVENOUS

## 2015-04-07 MED ORDER — THIAMINE HCL 100 MG/ML IJ SOLN
100.0000 mg | Freq: Every day | INTRAMUSCULAR | Status: DC
  Administered 2015-04-07 – 2015-04-09 (×3): 100 mg via INTRAVENOUS
  Filled 2015-04-07 (×3): qty 2

## 2015-04-07 MED ORDER — POTASSIUM CHLORIDE CRYS ER 20 MEQ PO TBCR
20.0000 meq | EXTENDED_RELEASE_TABLET | Freq: Every day | ORAL | Status: DC
  Administered 2015-04-07 – 2015-04-09 (×3): 20 meq via ORAL
  Filled 2015-04-07 (×3): qty 1

## 2015-04-07 NOTE — Progress Notes (Signed)
PROGRESS NOTE  Mary Garrison WUJ:[id number] DOB: 09/16/1990 DOA: 04/06/2015 PCP: Lora Paula, MD  HPI/Recap of past 24 hours:  C/o shakiness, mild epigastric pain, mild nausea, no active vomiting, no hematochezia, grandmother in room.  Assessment/Plan: Active Problems:   Asthma   Alcohol abuse   GI bleed   Hypokalemia  Hematemesis: on ppi/octreotide drip, npo, gi consulted, EGD pending,   Acute on chronic anemia: likely blood loss anemia, patient agreed with blood transfusion if needed.   Elevation of lipase and lft: likely from alcohol hepatitis/pancreatitis. Currently npo/ivf/prn pain meds/prn antiemetics. Trend labs  Cirrhosis: with alcohol dependent, h/o hepatitis c (spontaneously cleared in 2016 without treatment).    Code Status: full  Family Communication: patient   Disposition Plan: remain in stepdown   Consultants:  [facility]  Procedures:  EGD 8/20 Dr. Arlyce Dice  Antibiotics:  fortaz   Objective: BP 103/43 mmHg  Pulse 84  Temp(Src) 98.7 F (37.1 C) (Oral)  Resp 16  SpO2 100%  LMP 11/30/2014 (Within Days)  Intake/Output Summary (Last 24 hours) at 04/07/15 1442 Last data filed at 04/07/15 1023  Gross per 24 hour  Intake 2705.42 ml  Output    250 ml  Net 2455.42 ml   There were no vitals filed for this visit.  Exam:   General:  drowsy  Cardiovascular: sinus tachycardia  Respiratory: CTABL  Abdomen: mild epigastric tenderness, no rebound, no guarding, Soft/NT, positive BS  Musculoskeletal: No Edema  Neuro: aaox3  Data Reviewed: Basic Metabolic Panel:  Recent Labs Lab 04/06/15 2112 04/07/15 0712 04/07/15 0957  NA 138 135 133*  K 3.2* 4.4 3.6  CL 99* 102 100*  CO2 23 18* 19*  GLUCOSE 101* 64* 77  BUN CREATININE 0.52 0.41* 0.48  CALCIUM 9.0 8.3* 8.2*  MG  --  1.4*  --   PHOS  --  3.8  --    Liver Function Tests:  Recent Labs Lab 04/06/15 2112 04/07/15 0712 04/07/15 0957  AST 367* 292* 301*  ALT 86* 72*  77*  ALKPHOS 143* 113 125  BILITOT 8.6* 7.3* 7.6*  PROT 8.9* 7.7 8.3*  ALBUMIN 3.7 3.2* 3.5    Recent Labs Lab 04/06/15 2112 04/07/15 0957  LIPASE 108* 62*    Recent Labs Lab 04/06/15 2111  AMMONIA 53*   CBC:  Recent Labs Lab 04/06/15 2112 04/07/15 0658 04/07/15 0957  WBC 6.2 3.8* 4.0  NEUTROABS 4.1  --   --   HGB 9.1* 8.0* 8.1*  HCT 27.1* 23.9* 25.2*  MCV 95.8 95.2 96.9  PLT 97* 75* 79*   Cardiac Enzymes:   No results for input(s): CKTOTAL, CKMB, CKMBINDEX, TROPONINI in the last 168 hours. BNP (last 3 results) No results for input(s): BNP in the last 8760 hours.  ProBNP (last 3 results) No results for input(s): PROBNP in the last 8760 hours.  CBG: No results for input(s): GLUCAP in the last 168 hours.  Recent Results (from the past 240 hour(s))  MRSA PCR Screening     Status: None   Collection Time: 04/07/15  7:54 AM  Result Value Ref Range Status   MRSA by PCR NEGATIVE NEGATIVE Final    Comment:        The GeneXpert MRSA Assay (FDA approved for NASAL specimens only), is one component of a comprehensive MRSA colonization surveillance program. It is not intended to diagnose MRSA infection nor to guide or monitor treatment for MRSA infections.      Studies: No  results found.  Scheduled Meds: . cefTAZidime (FORTAZ)  IV  1 g Intravenous 3 times per day  . folic acid  1 mg Intravenous Daily  . nicotine  7 mg Transdermal Daily  . [START ON 04/10/2015] pantoprazole (PROTONIX) IV  40 mg Intravenous Q12H  . potassium chloride SA  20 mEq Oral Daily  . sodium chloride  3 mL Intravenous Q12H  . thiamine  100 mg Intravenous Daily    Continuous Infusions: . sodium chloride 125 mL/hr at 04/06/15 2054  . octreotide  (SANDOSTATIN)    IV infusion 50 mcg/hr (04/07/15 1246)  . pantoprozole (PROTONIX) infusion 8 mg/hr (04/07/15 1251)     Time spent:  Anthoni Geerts MD, PhD  [facility] Pager [phone]. If 7PM-7AM, please contact night-coverage at  [email or web address], password [facility] 04/07/2015, 2:42 PM  LOS: 0 days

## 2015-04-07 NOTE — ED Notes (Signed)
Per admitting MD  Pt refuses to transfer to [facility] and this writer spoke with pt she is aware she will be holding in our ED due to no bed availability here

## 2015-04-07 NOTE — Progress Notes (Signed)
EGD shows a clean-based Mallory-Weiss tear. Octreotide was d/ced. Continue PPI drip. Begin full liquids.

## 2015-04-07 NOTE — Anesthesia Procedure Notes (Signed)
Procedure Name: Intubation Performed by: Jenna Ardoin J Pre-anesthesia Checklist: Patient identified, Emergency Drugs available, Suction available, Patient being monitored and Timeout performed Patient Re-evaluated:Patient Re-evaluated prior to inductionOxygen Delivery Method: Circle system utilized Preoxygenation: Pre-oxygenation with 100% oxygen Intubation Type: IV induction Ventilation: Mask ventilation without difficulty Laryngoscope Size: Mac and 3 Grade View: Grade I Tube type: Oral Tube size: 7.0 mm Number of attempts: 1 Airway Equipment and Method: Stylet Placement Confirmation: ETT inserted through vocal cords under direct vision,  positive ETCO2,  CO2 detector and breath sounds checked- equal and bilateral Secured at: 21 cm Tube secured with: Tape Dental Injury: Teeth and Oropharynx as per pre-operative assessment        

## 2015-04-07 NOTE — ED Notes (Signed)
Danny with IV team attempted IV he said there was no way to get a second IV,  Admitting MD Doutova aware and no new orders given

## 2015-04-07 NOTE — Consult Note (Signed)
Referring Provider: [facility] Primary Care Physician:  Lora Paula, MD Primary Gastroenterologist:  Dr. Arlyce Dice  Reason for Consultation: GIB, hematemesis      HPI: Mary Garrison is a 24 y.o. female who is status post a recent admission on 02/16/2015 with acute alcohol related hepatitis amongst underlying hepatitis C. She has an extensive history of alcohol abuse and decompensated cirrhosis. On admission evaluation in July  she was noted to be hypotensive with acute kidney injury. Her diuretics were discontinued and she was started on Midodrine and albumin for hepatorenal syndrome.. She has a history of substance abuse, alcohol abuse, and was diagnosed with hepatitis C in April 2014. Her HCV quantitative was 80,528 in January 2016. She had an episode of C. difficile diarrhea in January 2016. She had anticipated starting treatment for her hepatitis C with [facility] this summer however 2 days before she was scheduled to start she had a negative viral load and was told she cleared the virus and did not need treatment. She was evaluated by Dr. Arlyce Dice in the office on August 4 and was scheduled for an upper endoscopy. It was also explained to her at length that she has well established cirrhosis which would inevitably worsen if she continues to drink alcohol.  Patient presented to the emergency room last night with hematemesis. She states she had been abstinent from alcohol for several weeks until  about a week ago when she began drinking 750 mL vodka per day. She reports that she has been having black stools for about a week, and 2 days ago started vomiting blood. Initially it was small streaks of blood but yesterday became large volume bloody emesis. She has some mild right upper quadrant pain. She states she has not vomited since last evening. She reports that she has been bruising easily.LMP April--pt says had always been very irregular.   Past Medical History  Diagnosis Date  .  Hepatitis C 11/2012    she suspects this was from a tattoo as she was dxd with Hep C before starting her IVDA.   Marland Kitchen Asthma     as a child  . Hypophosphatemia 06/29/2014    Phos of 1.4 > 4.0 with supplementation    . Hypokalemia 06/26/2014  . Alcohol addiction   . Polysubstance dependence     including IV heroin. inpt rehab 01/2012.   . C. difficile diarrhea 08/24/2014  . Tachycardia   . Hemorrhoids     History reviewed. No pertinent past surgical history.  Prior to Admission medications   Medication Sig Start Date End Date Taking? Authorizing Provider  albuterol (PROVENTIL HFA;VENTOLIN HFA) 108 (90 BASE) MCG/ACT inhaler Inhale 2 puffs into the lungs every 6 (six) hours as needed. For shortness of breath 03/06/15  Yes Josalyn Funches, MD  calcium-vitamin D (OSCAL WITH D) 500-200 MG-UNIT per tablet Take 1 tablet by mouth 2 (two) times daily. 07/26/14  Yes Josalyn Funches, MD  Cyanocobalamin (B-12 PO) Take 1 tablet by mouth daily.   Yes Historical Provider, MD  folic acid (FOLVITE) 1 MG tablet Take 1 tablet (1 mg total) by mouth daily. 06/28/14  Yes Josalyn Funches, MD  potassium chloride SA (K-DUR,KLOR-CON) 20 MEQ tablet Take 1 tablet (20 mEq total) by mouth daily. 03/07/15  Yes Josalyn Funches, MD  Pyridoxine HCl (B-6 PO) Take 1 tablet by mouth daily.   Yes Historical Provider, MD  Thiamine HCl (B-1 PO) Take 1 tablet by mouth daily.   Yes Historical Provider, MD  traMADol (  ULTRAM) 50 MG tablet Take 1 tablet (50 mg total) by mouth every 12 (twelve) hours as needed for moderate pain or severe pain. 03/06/15  Yes Josalyn Funches, MD  cholecalciferol (VITAMIN D) 1000 UNITS tablet Take 1 tablet (1,000 Units total) by mouth daily. Patient not taking: Reported on 04/06/2015 07/26/14   Dessa Phi, MD  Diclofenac Sodium 3 % GEL Place 1 application onto the skin as needed (for muscle pain). Patient not taking: Reported on 04/06/2015 12/27/14   Dessa Phi, MD  ondansetron (ZOFRAN ODT) 8 MG  disintegrating tablet Take 1 tablet (8 mg total) by mouth every 8 (eight) hours as needed for nausea or vomiting. Patient not taking: Reported on 04/06/2015 03/06/15   Dessa Phi, MD    Current Facility-Administered Medications  Medication Dose Route Frequency Provider Last Rate Last Dose  . 0.9 %  sodium chloride infusion   Intravenous Continuous Lorre Nick, MD 125 mL/hr at 04/06/15 2054    . albuterol (PROVENTIL) (2.5 MG/3ML) 0.083% nebulizer solution 2.5 mg  2.5 mg Nebulization Q6H PRN Therisa Doyne, MD      . cefTAZidime (FORTAZ) 1 g in dextrose 5 % 50 mL IVPB  1 g Intravenous 3 times per day Therisa Doyne, MD 100 mL/hr at 04/07/15 0533 1 g at 04/07/15 0533  . folic acid injection 1 mg  1 mg Intravenous Daily Therisa Doyne, MD      . LORazepam (ATIVAN) injection 2-3 mg  2-3 mg Intravenous Q1H PRN Therisa Doyne, MD      . nicotine (NICODERM CQ - dosed in mg/24 hr) patch 7 mg  7 mg Transdermal Daily Jinger Neighbors, NP   7 mg at 04/07/15 0559  . octreotide (SANDOSTATIN) 500 mcg in sodium chloride 0.9 % 250 mL (2 mcg/mL) infusion  50 mcg/hr Intravenous Continuous Therisa Doyne, MD 25 mL/hr at 04/07/15 0600 50 mcg/hr at 04/07/15 0600  . ondansetron (ZOFRAN) tablet 4 mg  4 mg Oral Q6H PRN Therisa Doyne, MD       Or  . ondansetron (ZOFRAN) injection 4 mg  4 mg Intravenous Q6H PRN Therisa Doyne, MD   4 mg at 04/07/15 0322  . pantoprazole (PROTONIX) 80 mg in sodium chloride 0.9 % 250 mL (0.32 mg/mL) infusion  8 mg/hr Intravenous Continuous Therisa Doyne, MD 25 mL/hr at 04/07/15 0600 8 mg/hr at 04/07/15 0600  . [START ON 04/10/2015] pantoprazole (PROTONIX) injection 40 mg  40 mg Intravenous Q12H Anastassia Doutova, MD      . potassium chloride SA (K-DUR,KLOR-CON) CR tablet 20 mEq  20 mEq Oral Daily Therisa Doyne, MD      . sodium chloride 0.9 % injection 3 mL  3 mL Intravenous Q12H Therisa Doyne, MD   3 mL at 04/07/15 0345  . thiamine (B-1) injection  100 mg  100 mg Intravenous Daily Therisa Doyne, MD        Allergies as of 04/06/2015 - Review Complete 04/06/2015  Allergen Reaction Noted  . Penicillins  02/12/2012  . Sulfa antibiotics  02/12/2012    Family History  Problem Relation Age of Onset  . Asthma Mother   . Depression Brother   . Other Brother   . Depression Sister   . Other Sister   . Liver cancer Maternal Grandfather   . Pancreatic cancer Maternal Grandfather   . Lung cancer Maternal Grandfather   . Heart disease Maternal Grandmother   . Heart disease Father     Social History   Social History  . Marital  Status: Single    Spouse Name: N/A  . Number of Children: 1  . Years of Education: N/A   Occupational History  . mother    Social History Main Topics  . Smoking status: Current Every Day Smoker -- 0.50 packs/day for 10 years    Types: Cigarettes  . Smokeless tobacco: Never Used  . Alcohol Use: 0.0 oz/week    0 Standard drinks or equivalent per week     Comment: heavy drinking at times-- last drink last night-- no longer drinking every day  . Drug Use: No     Comment: none in 2.5 years   was opiates  . Sexual Activity: Not Currently    Birth Control/ Protection: None   Other Topics Concern  . Not on file   Social History Narrative   Moved from [place] to [place], [place] in 02/2014.    Lives with grandmother, mother, daughter, sister, brother, mom's boyfriend.        Review of Systems: General denies fever, fatigue, chills, or night sweats HEENT: Denies headaches, dysphagia, ear pain, or nasal congestion Cardiovascular: Denies chest pain, orthopnea, dizziness, palpitations GI: Denies heartburn, has had anorexia. Has had black stools for a week, has vomited blood for 2 days. Respiratory: Denies shortness of breath, dyspnea on exertion, or cough Skin denies rash or lesions GU denies dysuria, urinary frequency, urinary urgency Musculoskeletal denies joint pain or swelling, no limitation of  range of motion Psych: Has a history of depression, has had no change in mood or affect Neuro, denies gait abnormality, blurred vision, double vision, weakness, or tingling  Physical Exam: Vital signs in last 24 hours: Temp:  [98.4 F (36.9 C)-98.5 F (36.9 C)] 98.5 F (36.9 C) (08/20 0515) Pulse Rate:  [73-112] 84 (08/20 0515) Resp:  [0-29] 16 (08/20 0515) BP: (96-123)/(49-63) 96/50 mmHg (08/20 0515) SpO2:  [95 %-100 %] 100 % (08/20 0515) Last BM Date: 04/06/15 General:   Alert, pale, thin, pleasant and cooperative in NAD Head:  Normocephalic and atraumatic. Eyes:  Sclera clear, no icterus. Conjunctiva pink. Ears:  Normal auditory acuity. Nose:  No deformity, discharge,  or lesions. Mouth:  No deformity or lesions.   Neck:  Supple; no masses or thyromegaly. Lungs:  Clear throughout to auscultation.   No wheezes, crackles, or rhonchi.  Heart:  Regular rate and rhythm Abdomen:  Soft, mild right upper quadrant tenderness to palpation BS active,nonpalp mass or hsm.   Rectal:  Deferred  Msk:  Symmetrical without gross deformities. . Pulses:  Normal pulses noted. Extremities: Without clubbing or edema. Neurologic:  Alert and  oriented x4;  grossly normal neurologically. Skin:  Intact without significant lesions or rashes.. Psych:  Alert and cooperative. Normal mood and affect.  Intake/Output from previous day: 08/19 0701 - 08/20 0700 In: 2205.4 [I.V.:2155.4; IV Piggyback:50] Out: -  Intake/Output this shift:    Lab Results:  Recent Labs  04/06/15 2112 04/07/15 0658  WBC 6.2 3.8*  HGB 9.1* 8.0*  HCT 27.1* 23.9*  PLT 97* 75*   BMET  Recent Labs  04/06/15 2112  NA 138  K 3.2*  CL 99*  CO2 23  GLUCOSE 101*  BUN 7  CREATININE 0.52  CALCIUM 9.0   LFT  Recent Labs  04/06/15 2112  PROT 8.9*  ALBUMIN 3.7  AST 367*  ALT 86*  ALKPHOS 143*  BILITOT 8.6*   PT/INR  Recent Labs  04/06/15 2112  LABPROT 19.5*  INR 1.65*    Studies/Results:  Show  images for CT Abdomen Pelvis Wo Contrast     Study Result     CLINICAL DATA: Abdominal pain, distension, cirrhosis.  EXAM: CT ABDOMEN AND PELVIS WITHOUT CONTRAST  TECHNIQUE: Multidetector CT imaging of the abdomen and pelvis was performed following the standard protocol without IV contrast.  COMPARISON: None.  FINDINGS: The liver is markedly enlarged and fatty infiltrated. No focal liver masses are evident. There is mild splenomegaly. There are mildly prominent upper abdominal vascular structures which may represent varices. There is small volume ascites collected dependently. There is mild gallbladder mural thickening, but this can be seen in the presence of ascites and particularly if there is portal hypertension.  The pancreas and adrenals are grossly normal in appearance. Both kidneys are negative for hydronephrosis. No urinary calculi are evident.  Stomach is unremarkable.  There is slightly prominent caliber of mid abdominal small bowel without abrupt caliber transition. This is likely nonobstructive.  There is no extraluminal air.  Uterus and adnexal structures appear grossly unremarkable.  No acute inflammatory changes are evident in the abdomen or pelvis.  Multifocal patchy airspace opacities are present in both lung bases, likely infectious infiltrate. Aspiration cannot be excluded. There are trace pleural effusions bilaterally.  IMPRESSION: 1. Markedly enlarged liver without focal mass. 2. Findings suggesting portal hypertension, with splenomegaly and mildly prominent upper abdominal vascular structures which may represent varices. 3. Small volume ascites. 4. Nonspecific mild gallbladder mural thickening, which can be seen in the presence of ascites and/or portal hypertension. 5. Small bowel is slightly prominent but appears nonobstructed. 6. No focal inflammatory changes. 7. Multifocal lung base infiltrates, probably infectious.  Aspiration cannot be excluded.   Electronically Signed  By: Ellery Plunk M.D.  On: 02/17/2015 05:13     IMPRESSION/PLAN: #1. GI bleed/hematemesis. Likely variceal in nature. Patient was scheduled for outpatient EGD on September 15 however this will be moved up with this admission. Currently on octreotide drip and protonix.  Would continue this along with IV fluids. Trend CBC, keep hemoglobin above 8. Continue Elita Quick.Will schedule for EGD later today . #2. Alcohol abuse. Patient states she would like to stop. Continue CIWA protocol. #3. Elevated lipase. Lipase 108 on admission, repeat this morning is pending. Likely pancreatitis secondary to EtOH. #4. History hepatitis C. HCV quantitative 03/20/2015 not detected, 7 months ago was 80,528. #5. Chronic liver disease. Transaminase pattern consistent with EtOH hepatitis.Discriminant function 28. (>32 may benefit from corticosteroids). #6. Amenorrhea--will check urine HCG.  Amelita Risinger, Moise Boring 04/07/2015,  Pager [phone]

## 2015-04-07 NOTE — Transfer of Care (Signed)
Immediate Anesthesia Transfer of Care Note  Patient: Mary Garrison  Procedure(s) Performed: Procedure(s): ESOPHAGOGASTRODUODENOSCOPY (EGD) (N/A)  Patient Location: PACU  Anesthesia Type:General  Level of Consciousness: sedated and patient cooperative  Airway & Oxygen Therapy: Patient Spontanous Breathing and Patient connected to face mask oxygen  Post-op Assessment: Report given to RN and Post -op Vital signs reviewed and stable  Post vital signs: Reviewed and stable  Last Vitals:  Filed Vitals:   04/07/15 1213  BP: 103/43  Pulse:   Temp: 37.1 C  Resp:     Complications: No apparent anesthesia complications

## 2015-04-07 NOTE — Anesthesia Preprocedure Evaluation (Addendum)
Anesthesia Evaluation  Patient identified by MRN, date of birth, ID band Patient awake    Reviewed: Allergy & Precautions, H&P , NPO status , Patient's Chart, lab work & pertinent test results  Airway Mallampati: II  TM Distance: >3 FB Neck ROM: full    Dental  (+) Dental Advisory Given, Chipped Right upper front chipped:   Pulmonary asthma , Current Smoker,  breath sounds clear to auscultation  Pulmonary exam normal       Cardiovascular Exercise Tolerance: Good negative cardio ROS Normal cardiovascular examRhythm:regular Rate:Normal     Neuro/Psych negative neurological ROS  negative psych ROS   GI/Hepatic negative GI ROS, GERD-  ,(+) Cirrhosis -  Esophageal Varices  substance abuse  alcohol use and IV drug use, Hepatitis -, C  Endo/Other  negative endocrine ROS  Renal/GU ARFRenal diseaseHistory ARF   negative genitourinary   Musculoskeletal   Abdominal   Peds  Hematology negative hematology ROS (+)   Anesthesia Other Findings   Reproductive/Obstetrics negative OB ROS                           Anesthesia Physical Anesthesia Plan  ASA: IV and emergent  Anesthesia Plan: General   Post-op Pain Management:    Induction: Intravenous, Rapid sequence and Cricoid pressure planned  Airway Management Planned: Oral ETT  Additional Equipment:   Intra-op Plan:   Post-operative Plan: Extubation in OR  Informed Consent: I have reviewed the patients History and Physical, chart, labs and discussed the procedure including the risks, benefits and alternatives for the proposed anesthesia with the patient or authorized representative who has indicated his/her understanding and acceptance.   Dental Advisory Given  Plan Discussed with: CRNA and Surgeon  Anesthesia Plan Comments:         Anesthesia Quick Evaluation

## 2015-04-07 NOTE — Anesthesia Postprocedure Evaluation (Signed)
  Anesthesia Post-op Note  Patient: Mary Garrison  Procedure(s) Performed: Procedure(s) (LRB): ESOPHAGOGASTRODUODENOSCOPY (EGD) (N/A)  Patient Location: PACU  Anesthesia Type: General  Level of Consciousness: awake and alert   Airway and Oxygen Therapy: Patient Spontanous Breathing  Post-op Pain: mild  Post-op Assessment: Post-op Vital signs reviewed, Patient's Cardiovascular Status Stable, Respiratory Function Stable, Patent Airway and No signs of Nausea or vomiting  Last Vitals:  Filed Vitals:   04/07/15 1615  BP:   Pulse:   Temp: 37 C  Resp: 12    Post-op Vital Signs: stable   Complications: No apparent anesthesia complications

## 2015-04-07 NOTE — ED Notes (Signed)
Spoke with JC in pharmacy due to concentrations of octreotide and protonix they are not compatible

## 2015-04-07 NOTE — ED Notes (Signed)
Pt moved to room 13 and states that she now feels nausea and still has minimal sweats,  No other complaints at this time,  Pt is given a cup of ice water per her request,  Also placed on hospital bed for comfort and second iv obtained by Autumn RN and report given to Cisco

## 2015-04-07 NOTE — Progress Notes (Signed)
ANTIBIOTIC CONSULT NOTE - INITIAL  Pharmacy Consult for Ceftazidime Indication: GI  Allergies  Allergen Reactions  . Penicillins     unknown  . Sulfa Antibiotics     unknown    Patient Measurements:   Adjusted Body Weight:   Vital Signs: Temp: 98.4 F (36.9 C) (08/19 2007) Temp Source: Oral (08/19 2007) BP: 114/60 mmHg (08/20 0117) Pulse Rate: 99 (08/20 0117) Intake/Output from previous day:   Intake/Output from this shift:    Labs:  Recent Labs  04/06/15 2112  WBC 6.2  HGB 9.1*  PLT 97*  CREATININE 0.52   CrCl cannot be calculated (Unknown ideal weight.). No results for input(s): VANCOTROUGH, VANCOPEAK, VANCORANDOM, GENTTROUGH, GENTPEAK, GENTRANDOM, TOBRATROUGH, TOBRAPEAK, TOBRARND, AMIKACINPEAK, AMIKACINTROU, AMIKACIN in the last 72 hours.   Microbiology: No results found for this or any previous visit (from the past 720 hour(s)).  Medical History: Past Medical History  Diagnosis Date  . Hepatitis C 11/2012    she suspects this was from a tattoo as she was dxd with Hep C before starting her IVDA.   Marland Kitchen Asthma     as a child  . Hypophosphatemia 06/29/2014    Phos of 1.4 > 4.0 with supplementation    . Hypokalemia 06/26/2014  . Alcohol addiction   . Polysubstance dependence     including IV heroin. inpt rehab 01/2012.   . C. difficile diarrhea 08/24/2014  . Tachycardia   . Hemorrhoids     Medications:  Anti-infectives    Start     Dose/Rate Route Frequency Ordered Stop   04/07/15 0130  cefTAZidime (FORTAZ) 1 g in dextrose 5 % 50 mL IVPB     1 g 100 mL/hr over 30 Minutes Intravenous 3 times per day 04/07/15 0118       Assessment: Patient with hematemesis and dark stools.  Hx of IVDA, hep C and EtOH abuse.  Dosing empiric therapy.  Goal of Therapy:  Ceftazidime dosed based on patient weight and renal function   Plan:  Follow up culture results  Ceftazidime 1gm iv q8hr  Darlina Guys, Cristiano Capri Crowford 04/07/2015,1:22 AM

## 2015-04-08 DIAGNOSIS — K226 Gastro-esophageal laceration-hemorrhage syndrome: Principal | ICD-10-CM

## 2015-04-08 DIAGNOSIS — D62 Acute posthemorrhagic anemia: Secondary | ICD-10-CM

## 2015-04-08 LAB — LIPASE, BLOOD: LIPASE: 63 U/L — AB (ref 22–51)

## 2015-04-08 LAB — COMPREHENSIVE METABOLIC PANEL
ALT: 56 U/L — AB (ref 14–54)
ANION GAP: 9 (ref 5–15)
AST: 210 U/L — ABNORMAL HIGH (ref 15–41)
Albumin: 2.7 g/dL — ABNORMAL LOW (ref 3.5–5.0)
Alkaline Phosphatase: 101 U/L (ref 38–126)
BUN: 5 mg/dL — ABNORMAL LOW (ref 6–20)
CALCIUM: 8.6 mg/dL — AB (ref 8.9–10.3)
CHLORIDE: 102 mmol/L (ref 101–111)
CO2: 21 mmol/L — ABNORMAL LOW (ref 22–32)
CREATININE: 0.51 mg/dL (ref 0.44–1.00)
Glucose, Bld: 83 mg/dL (ref 65–99)
Potassium: 4.1 mmol/L (ref 3.5–5.1)
Sodium: 132 mmol/L — ABNORMAL LOW (ref 135–145)
Total Bilirubin: 7.7 mg/dL — ABNORMAL HIGH (ref 0.3–1.2)
Total Protein: 6.6 g/dL (ref 6.5–8.1)

## 2015-04-08 LAB — PROTIME-INR
INR: 1.92 — ABNORMAL HIGH (ref 0.00–1.49)
Prothrombin Time: 21.9 seconds — ABNORMAL HIGH (ref 11.6–15.2)

## 2015-04-08 LAB — CBC
HCT: 21.2 % — ABNORMAL LOW (ref 36.0–46.0)
Hemoglobin: 7.1 g/dL — ABNORMAL LOW (ref 12.0–15.0)
MCH: 32.4 pg (ref 26.0–34.0)
MCHC: 33.5 g/dL (ref 30.0–36.0)
MCV: 96.8 fL (ref 78.0–100.0)
PLATELETS: 60 10*3/uL — AB (ref 150–400)
RBC: 2.19 MIL/uL — AB (ref 3.87–5.11)
RDW: 13.9 % (ref 11.5–15.5)
WBC: 2.8 10*3/uL — ABNORMAL LOW (ref 4.0–10.5)

## 2015-04-08 LAB — MAGNESIUM: MAGNESIUM: 1.3 mg/dL — AB (ref 1.7–2.4)

## 2015-04-08 MED ORDER — OXYMETAZOLINE HCL 0.05 % NA SOLN
2.0000 | Freq: Two times a day (BID) | NASAL | Status: DC | PRN
  Administered 2015-04-08: 2 via NASAL
  Filled 2015-04-08: qty 15

## 2015-04-08 MED ORDER — PHENOL 1.4 % MT LIQD
1.0000 | OROMUCOSAL | Status: DC | PRN
  Filled 2015-04-08: qty 177

## 2015-04-08 MED ORDER — MAGNESIUM SULFATE 2 GM/50ML IV SOLN
2.0000 g | Freq: Once | INTRAVENOUS | Status: AC
Start: 2015-04-08 — End: 2015-04-08
  Administered 2015-04-08: 2 g via INTRAVENOUS
  Filled 2015-04-08: qty 50

## 2015-04-08 NOTE — Progress Notes (Signed)
     [facility] Progress Note  Subjective: S/P EGD yesterday that showed Mallory-Weiss tear. hgb 7.1 this morning.No further vomiting. Denies melena. C/O sore throat this morning.   Objective:  Vital signs in last 24 hours: Temp:  [98.6 F (37 C)-99.4 F (37.4 C)] 98.9 F (37.2 C) (08/21 0445) Pulse Rate:  [73-110] 74 (08/21 0600) Resp:  [12-20] 18 (08/21 0600) BP: (98-119)/(43-74) 101/52 mmHg (08/21 0600) SpO2:  [95 %-100 %] 95 % (08/21 0600) Weight:  [103 lb 6.3 oz (46.9 kg)] 103 lb 6.3 oz (46.9 kg) (08/21 0445) Last BM Date: 04/06/15 General:   Alert,  Well-developed,    in NAD Heart:  Regular rate and rhythm; no murmurs Pulm;lungs clear Abdomen:  Soft, nontender and nondistended. Normal bowel sounds, without guarding, and without rebound.   Extremities:  Without edema. Neurologic:  Alert and  oriented x4;  grossly normal neurologically. Psych:  Alert and cooperative. Normal mood and affect.  Intake/Output from previous day: 08/20 0701 - 08/21 0700 In: 3581.3 [P.O.:120; I.V.:3361.3; IV Piggyback:100] Out: 250 [Urine:250] Intake/Output this shift:    Lab Results:  Recent Labs  04/07/15 0658 04/07/15 0957 04/08/15 0449  WBC 3.8* 4.0 2.8*  HGB 8.0* 8.1* 7.1*  HCT 23.9* 25.2* 21.2*  PLT 75* 79* 60*   BMET  Recent Labs  04/07/15 0712 04/07/15 0957 04/08/15 0449  NA 135 133* 132*  K 4.4 3.6 4.1  CL 102 100* 102  CO2 18* 19* 21*  GLUCOSE 64* 77 83  BUN 7 6 5*  CREATININE 0.41* 0.48 0.51  CALCIUM 8.3* 8.2* 8.6*   LFT  Recent Labs  04/08/15 0449  PROT 6.6  ALBUMIN 2.7*  AST 210*  ALT 56*  ALKPHOS 101  BILITOT 7.7*  lipase 63 PT/INR  Recent Labs  04/06/15 2112 04/07/15 0957  LABPROT 19.5* 20.9*  INR 1.65* 1.80*   Hepatitis Panel No results for input(s): HEPBSAG, HCVAB, HEPAIGM, HEPBIGM in the last 72 hours.  No results found.  ASSESSMENT/PLAN:   24 yo female with ETOH abuse, admitted with UGIB. EGD revealed  Mallory-Weiss tear.Cont PPI. Trend Hgb--would transfuse to keep above 7. Lipase improving. Pt advised she needs to stop ETOH. Chloraseptic prn throat pain.    LOS: 1 day   Hvozdovic, Moise Boring 04/08/2015, Pager [phone]  GI Attending Note  I have personally taken an interval history, reviewed the chart, and examined the patient.  I agree with the extender's note, impression and recommendations.  May advance diet and switch to by mouth meds  Barbette Hair. Arlyce Dice, MD, [facility] [phone]

## 2015-04-08 NOTE — Progress Notes (Signed)
PROGRESS NOTE  Mary Garrison UVO:[id number] DOB: [birth year] DOA: 04/06/2015 PCP: Lora Paula, MD  HPI/Recap of past 24 hours:  Wanting to go home, report mild epigastric pain, mild nausea, no active vomiting, no hematochezia,   Assessment/Plan: Active Problems:   Asthma   Alcohol abuse   GI bleed   Hypokalemia   Mallory-Weiss tear  Hematemesis:  on ppi/octreotide drip initially, now on ppi drip only after EGD reviewed mallory-weiss tear. Diet advanced to full liquid, d/c ivf, gi input appreciated  Acute on chronic anemia: likely blood loss anemia, patient agreed with blood transfusion if needed. hgb 7 today, may need prbc tomorrow if continue to drop.  Elevation of lipase and lft: likely from alcohol hepatitis/pancreatitis.  Labs improving, started full liquid on 8/21.   Cirrhosis: with alcohol dependent, h/o hepatitis c (spontaneously cleared in 2016 without treatment).   Alcohol withdrawal protocol  Code Status: full  Family Communication: patient   Disposition Plan: remain in stepdown   Consultants:  LBGI  Procedures:  EGD 8/20 Dr. Arlyce Dice  Antibiotics:  fortaz   Objective: BP 102/54 mmHg  Pulse 78  Temp(Src) 98.9 F (37.2 C) (Oral)  Resp 11  Wt 103 lb 6.3 oz (46.9 kg)  SpO2 98%  LMP 11/30/2014 (Within Days)  Intake/Output Summary (Last 24 hours) at 04/08/15 1420 Last data filed at 04/08/15 1309  Gross per 24 hour  Intake 4211.33 ml  Output    500 ml  Net 3711.33 ml   Filed Weights   04/08/15 0445  Weight: 103 lb 6.3 oz (46.9 kg)    Exam:   General:  More alert  Cardiovascular: less sinus tachycardia  Respiratory: CTABL  Abdomen: mild epigastric tenderness, no rebound, no guarding, Soft/NT, positive BS  Musculoskeletal: No Edema  Neuro: aaox3  Data Reviewed: Basic Metabolic Panel:  Recent Labs Lab 04/06/15 2112 04/07/15 0712 04/07/15 0957 04/08/15 0449  NA 138 135 133* 132*  K 3.2* 4.4 3.6 4.1  CL 99* 102  100* 102  CO2 23 18* 19* 21*  GLUCOSE 101* 64* 77 83  BUN 5*  CREATININE 0.52 0.41* 0.48 0.51  CALCIUM 9.0 8.3* 8.2* 8.6*  MG  --  1.4*  --  1.3*  PHOS  --  3.8  --   --    Liver Function Tests:  Recent Labs Lab 04/06/15 2112 04/07/15 0712 04/07/15 0957 04/08/15 0449  AST 367* 292* 301* 210*  ALT 86* 72* 77* 56*  ALKPHOS 143* 113 125 101  BILITOT 8.6* 7.3* 7.6* 7.7*  PROT 8.9* 7.7 8.3* 6.6  ALBUMIN 3.7 3.2* 3.5 2.7*    Recent Labs Lab 04/06/15 2112 04/07/15 0957 04/08/15 0449  LIPASE 108* 62* 63*    Recent Labs Lab 04/06/15 2111  AMMONIA 53*   CBC:  Recent Labs Lab 04/06/15 2112 04/07/15 0658 04/07/15 0957 04/08/15 0449  WBC 6.2 3.8* 4.0 2.8*  NEUTROABS 4.1  --   --   --   HGB 9.1* 8.0* 8.1* 7.1*  HCT 27.1* 23.9* 25.2* 21.2*  MCV 95.8 95.2 96.9 96.8  PLT 97* 75* 79* 60*   Cardiac Enzymes:   No results for input(s): CKTOTAL, CKMB, CKMBINDEX, TROPONINI in the last 168 hours. BNP (last 3 results) No results for input(s): BNP in the last 8760 hours.  ProBNP (last 3 results) No results for input(s): PROBNP in the last 8760 hours.  CBG: No results for input(s): GLUCAP in the last 168 hours.  Recent Results (from the past  240 hour(s))  MRSA PCR Screening     Status: None   Collection Time: 04/07/15  7:54 AM  Result Value Ref Range Status   MRSA by PCR NEGATIVE NEGATIVE Final    Comment:        The GeneXpert MRSA Assay (FDA approved for NASAL specimens only), is one component of a comprehensive MRSA colonization surveillance program. It is not intended to diagnose MRSA infection nor to guide or monitor treatment for MRSA infections.      Studies: No results found.  Scheduled Meds: . cefTAZidime (FORTAZ)  IV  1 g Intravenous 3 times per day  . folic acid  1 mg Intravenous Daily  . nicotine  7 mg Transdermal Daily  . [START ON 04/10/2015] pantoprazole (PROTONIX) IV  40 mg Intravenous Q12H  . potassium chloride SA  20 mEq Oral Daily   . sodium chloride  3 mL Intravenous Q12H  . thiamine  100 mg Intravenous Daily    Continuous Infusions: . sodium chloride    . lactated ringers 125 mL/hr at 04/07/15 1632  . pantoprozole (PROTONIX) infusion 8 mg/hr (04/08/15 1309)     Time spent:  Pheobe Sandiford MD, PhD  [facility] Pager [phone]. If 7PM-7AM, please contact night-coverage at [email or web address], password [facility] 04/08/2015, 2:20 PM  LOS: 1 day

## 2015-04-08 NOTE — Progress Notes (Signed)
At about 2200 pat called c/o nose bleed, On assessment pt had bright red blood dripping from her nose and had a small pool on her pillow. Pt was alert and oriented, in no acute distress. [facility] night coverage was notified, order for Afrin was received. Pt will continue to be monitored.

## 2015-04-09 ENCOUNTER — Other Ambulatory Visit: Payer: Self-pay

## 2015-04-09 ENCOUNTER — Telehealth: Payer: Self-pay

## 2015-04-09 ENCOUNTER — Encounter (HOSPITAL_COMMUNITY): Payer: Self-pay | Admitting: Gastroenterology

## 2015-04-09 DIAGNOSIS — K7010 Alcoholic hepatitis without ascites: Secondary | ICD-10-CM

## 2015-04-09 DIAGNOSIS — D61818 Other pancytopenia: Secondary | ICD-10-CM

## 2015-04-09 DIAGNOSIS — K7031 Alcoholic cirrhosis of liver with ascites: Secondary | ICD-10-CM

## 2015-04-09 LAB — COMPREHENSIVE METABOLIC PANEL
ALT: 62 U/L — ABNORMAL HIGH (ref 14–54)
AST: 212 U/L — ABNORMAL HIGH (ref 15–41)
Albumin: 3 g/dL — ABNORMAL LOW (ref 3.5–5.0)
Alkaline Phosphatase: 114 U/L (ref 38–126)
Anion gap: 9 (ref 5–15)
BUN: <5 mg/dL — ABNORMAL LOW (ref 6–20)
CHLORIDE: 101 mmol/L (ref 101–111)
CO2: 23 mmol/L (ref 22–32)
Calcium: 9.1 mg/dL (ref 8.9–10.3)
Creatinine, Ser: 0.44 mg/dL (ref 0.44–1.00)
Glucose, Bld: 113 mg/dL — ABNORMAL HIGH (ref 65–99)
POTASSIUM: 4 mmol/L (ref 3.5–5.1)
Sodium: 133 mmol/L — ABNORMAL LOW (ref 135–145)
Total Bilirubin: 6.9 mg/dL — ABNORMAL HIGH (ref 0.3–1.2)
Total Protein: 7.4 g/dL (ref 6.5–8.1)

## 2015-04-09 LAB — CBC
HCT: 21.6 % — ABNORMAL LOW (ref 36.0–46.0)
Hemoglobin: 7.2 g/dL — ABNORMAL LOW (ref 12.0–15.0)
MCH: 32.6 pg (ref 26.0–34.0)
MCHC: 33.3 g/dL (ref 30.0–36.0)
MCV: 97.7 fL (ref 78.0–100.0)
PLATELETS: 56 10*3/uL — AB (ref 150–400)
RBC: 2.21 MIL/uL — ABNORMAL LOW (ref 3.87–5.11)
RDW: 14 % (ref 11.5–15.5)
WBC: 2.9 10*3/uL — AB (ref 4.0–10.5)

## 2015-04-09 LAB — LIPASE, BLOOD: LIPASE: 74 U/L — AB (ref 22–51)

## 2015-04-09 LAB — MAGNESIUM: MAGNESIUM: 1.7 mg/dL (ref 1.7–2.4)

## 2015-04-09 MED ORDER — PANTOPRAZOLE SODIUM 40 MG PO TBEC
40.0000 mg | DELAYED_RELEASE_TABLET | Freq: Every day | ORAL | Status: DC
  Administered 2015-04-09: 40 mg via ORAL
  Filled 2015-04-09: qty 1

## 2015-04-09 MED ORDER — PANTOPRAZOLE SODIUM 40 MG PO TBEC: 40.0000 mg | DELAYED_RELEASE_TABLET | Freq: Every day | ORAL | Status: DC

## 2015-04-09 NOTE — Progress Notes (Signed)
Daily Rounding Note  04/09/2015, 8:50 AM  LOS: 2 days   SUBJECTIVE:       Pt c/o headache.  No abdominal pain or nausea.  Stools small but brown.  No cough or dyspnea  OBJECTIVE:         Vital signs in last 24 hours:    Temp:  [98.5 F (36.9 C)-99 F (37.2 C)] 98.5 F (36.9 C) (08/22 0423) Pulse Rate:  [95-114] 114 (08/22 0800) Resp:  [11-18] 18 (08/22 0800) BP: (102-121)/(54-77) 107/65 mmHg (08/22 0800) SpO2:  [93 %-98 %] 98 % (08/22 0800) Last BM Date: 04/08/15 Filed Weights   04/06/15 2247 04/08/15 0445  Weight: 110 lb 3.7 oz (50 kg) 103 lb 6.3 oz (46.9 kg)   General: looks pale, malnourished and ill   Heart: RRR Chest: clear bil.  No cough or dyspnea Abdomen: soft, NT, ND.  Active BS  Extremities: no CCE Neuro/Psych:  Cooperative, laconic, no tremor, no asterixis.   Intake/Output from previous day: 08/21 0701 - 08/22 0700 In: 2023 [P.O.:660; I.V.:1113; IV Piggyback:250] Out: 3000 [Urine:3000]  Intake/Output this shift: Total I/O In: 275 [P.O.:240; I.V.:35] Out: 75 [Urine:75]  Lab Results:  Recent Labs  04/07/15 0957 04/08/15 0449 04/09/15 0400  WBC 4.0 2.8* 2.9*  HGB 8.1* 7.1* 7.2*  HCT 25.2* 21.2* 21.6*  PLT 79* 60* 56*   BMET  Recent Labs  04/07/15 0957 04/08/15 0449 04/09/15 0400  NA 133* 132* 133*  K 3.6 4.1 4.0  CL 100* 102 101  CO2 19* 21* 23  GLUCOSE 77 83 113*  BUN 6 5* <5*  CREATININE 0.48 0.51 0.44  CALCIUM 8.2* 8.6* 9.1   LFT  Recent Labs  04/07/15 0957 04/08/15 0449 04/09/15 0400  PROT 8.3* 6.6 7.4  ALBUMIN 3.5 2.7* 3.0*  AST 301* 210* 212*  ALT 77* 56* 62*  ALKPHOS 125 101 114  BILITOT 7.6* 7.7* 6.9*   PT/INR  Recent Labs  04/07/15 0957 04/08/15 0449  LABPROT 20.9* 21.9*  INR 1.80* 1.92*   Hepatitis Panel No results for input(s): HEPBSAG, HCVAB, HEPAIGM, HEPBIGM in the last 72 hours.  Studies/Results: No results found.   Scheduled Meds: .  cefTAZidime (FORTAZ)  IV  1 g Intravenous 3 times per day  . folic acid  1 mg Intravenous Daily  . nicotine  7 mg Transdermal Daily  . [START ON 04/10/2015] pantoprazole (PROTONIX) IV  40 mg Intravenous Q12H  . potassium chloride SA  20 mEq Oral Daily  . sodium chloride  3 mL Intravenous Q12H  . thiamine  100 mg Intravenous Daily   Continuous Infusions: . sodium chloride    . lactated ringers 125 mL/hr at 04/07/15 1632  . pantoprozole (PROTONIX) infusion Stopped (04/09/15 0658)   PRN Meds:.albuterol, fentaNYL (SUBLIMAZE) injection, LORazepam, ondansetron **OR** ondansetron (ZOFRAN) IV, oxymetazoline, phenol   ASSESMENT:   *  Hematemesis. EGD 8/20:  MWT   *  Alcoholic hepatitis. Cirrhosis.  Hepatitis C +, spontaneously cleared: undetectable virus on labs of 8/2 (80 K quant viral load 1/20).   Ongoing  ETOH abuse  *  Elevated Lipase to max 108. No visible pancreatitis by CT of 7/2.   *  Coagulopathy.  Due to cirrhosis.   *  Pancytopenia including acute on chronic normocytic anemia.   *  Pulmonary infiltrates on CT, on Fortaz.    PLAN   *  Switch to oral PPI.  Advanced to regular diet.   *  Advised pt to resume attending AA meetings and abstain from ETOH.Jennye Moccasin  04/09/2015, 8:50 AM Pager: [phone]  GI ATTENDING  Patient's case discussed in GI morning report with colleagues. Agree with interval progress note as outlined above. No further GI bleeding. Still recovering from alcoholic hepatitis. History of hepatitis C as outlined. The patient is to be discharged today. She was strictly advised tube stain from all alcohol. She has outpatient GI follow-up scheduled with Dr. Arlyce Dice in 1 month.  Wilhemina Bonito. Eda Keys., M.D. [facility]

## 2015-04-09 NOTE — Progress Notes (Signed)
Patient discharged home with mother per MD order. Given all discharge instructions.

## 2015-04-09 NOTE — Discharge Summary (Addendum)
Discharge Summary  Mary Garrison WUJ:[id number] DOB: 10-31-1990  PCP: Lora Paula, MD  Admit date: 04/06/2015 Discharge date: 04/09/2015  Time spent: <49mins  Recommendations for Outpatient Follow-up:  1. F/u with PMD within a week, repeat cbc/cmp at follow up 2. F/u with gastroenterology  Discharge Diagnoses:  Active Hospital Problems   Diagnosis Date Noted  . Acute blood loss anemia   . GI bleed 04/07/2015  . Hypokalemia 04/07/2015  . Mallory-Weiss tear 04/07/2015  . Alcohol abuse 08/20/2014  . Asthma     Resolved Hospital Problems   Diagnosis Date Noted Date Resolved  No resolved problems to display.    Discharge Condition: stable  Diet recommendation: soft diet, low fat, avoid alcohol  Filed Weights   04/06/15 2247 04/08/15 0445  Weight: 110 lb 3.7 oz (50 kg) 103 lb 6.3 oz (46.9 kg)    History of present illness:  Mary Garrison is a 24 y.o. female   has a past medical history of Hepatitis C (11/2012); Asthma; Hypophosphatemia (06/29/2014); Hypokalemia (06/26/2014); Alcohol addiction; Polysubstance dependence; C. difficile diarrhea (08/24/2014); Tachycardia; and Hemorrhoids.   Presented with a 24-hour history of intermittently vomiting blood. Patient has history of some alcoholic cirrhosis. In the past was told that she has hepatitis C but on recent testing apparently had spontaneously cleared it per infectious disease note. Patient was continued to drink. States that the only time she stopped drinking was during her prior hospitalization. Patient at this point is interested in quitting states she 'does not want to die". Patient denies any blood in stool no melanoma. Reports right upper quadrant pain and tenderness. Reports easy bruising and bleeding. Patient is currently tremulous. Patient states that she never had history of seizures. She is followed by Dr. Arlyce Dice with GI In emergency department patient was noted to have mildly elevated lipase up to 108 INR  1.65 hemoglobin at 9.1 which is her baseline vital signs patient was transcended tachycardic up to 112 but currently down to 80s blood pressure stable 102/52 patient's reports chronic history of low blood pressures.   Hospitalist was called for admission for Hematemesis   Hospital Course:  Active Problems:   Asthma   Alcohol abuse   GI bleed   Hypokalemia   Mallory-Weiss tear   Acute blood loss anemia  Hematemesis:  on ppi/octreotide drip initially, now on ppi drip only after EGD reviewed mallory-weiss tear. Diet advanced to full liquid, d/c ivf, gi input appreciated  Acute on chronic anemia: likely blood loss anemia, patient agreed with blood transfusion if needed. hgb 7 today, may need prbc tomorrow if continue to drop.  Elevation of lipase and lft: likely from alcohol hepatitis/pancreatitis.  Labs improving, started full liquid on 8/21.   Cirrhosis: with alcohol dependent, h/o hepatitis c (spontaneously cleared in 2016 without treatment).  With ascites, splenomegaly, anemia and thrombocytopenia. Close monitor.  Alcohol withdrawal protocol, mild symptom of tremor which has resolved. No delirium, no seizure.  Code Status: full  Family Communication: patient   Disposition Plan: remain in stepdown   Consultants:  LBGI  Procedures:  EGD 8/20 Dr. Arlyce Dice  Antibiotics:  fortaz  Discharge Exam: BP 104/55 mmHg  Pulse 83  Temp(Src) 98.6 F (37 C) (Oral)  Resp 16  Ht 5\' 5"  (1.651 m)  Wt 103 lb 6.3 oz (46.9 kg)  BMI 17.21 kg/m2  SpO2 96%  LMP 11/30/2014 (Within Days)   General: NAD,sitting up in chair having lunch  Cardiovascular: RRR  Respiratory: CTABL  Abdomen: mild epigastric tenderness  has resolved, no rebound, no guarding, Soft/NT, positive BS  Musculoskeletal: No Edema  Neuro: aaox3   Discharge Instructions You were cared for by a hospitalist during your hospital stay. If you have any questions about your discharge medications or the care you  received while you were in the hospital after you are discharged, you can call the unit and asked to speak with the hospitalist on call if the hospitalist that took care of you is not available. Once you are discharged, your primary care physician will handle any further medical issues. Please note that NO REFILLS for any discharge medications will be authorized once you are discharged, as it is imperative that you return to your primary care physician (or establish a relationship with a primary care physician if you do not have one) for your aftercare needs so that they can reassess your need for medications and monitor your lab values.     Medication List    TAKE these medications        albuterol 108 (90 BASE) MCG/ACT inhaler  Commonly known as:  PROVENTIL HFA;VENTOLIN HFA  Inhale 2 puffs into the lungs every 6 (six) hours as needed. For shortness of breath     B-1 PO  Take 1 tablet by mouth daily.     B-12 PO  Take 1 tablet by mouth daily.     B-6 PO  Take 1 tablet by mouth daily.     calcium-vitamin D 500-200 MG-UNIT per tablet  Commonly known as:  OSCAL WITH D  Take 1 tablet by mouth 2 (two) times daily.     cholecalciferol 1000 UNITS tablet  Commonly known as:  VITAMIN D  Take 1 tablet (1,000 Units total) by mouth daily.     Diclofenac Sodium 3 % Gel  Place 1 application onto the skin as needed (for muscle pain).     folic acid 1 MG tablet  Commonly known as:  FOLVITE  Take 1 tablet (1 mg total) by mouth daily.     ondansetron 8 MG disintegrating tablet  Commonly known as:  ZOFRAN ODT  Take 1 tablet (8 mg total) by mouth every 8 (eight) hours as needed for nausea or vomiting.     pantoprazole 40 MG tablet  Commonly known as:  PROTONIX  Take 1 tablet (40 mg total) by mouth daily.     potassium chloride SA 20 MEQ tablet  Commonly known as:  K-DUR,KLOR-CON  Take 1 tablet (20 mEq total) by mouth daily.     traMADol 50 MG tablet  Commonly known as:  ULTRAM  Take 1  tablet (50 mg total) by mouth every 12 (twelve) hours as needed for moderate pain or severe pain.       Allergies  Allergen Reactions  . Penicillins     unknown  . Sulfa Antibiotics     unknown   Follow-up Information    Follow up with Lora Paula, MD In 1 week.   Specialty:  Family Medicine   Why:  hospital discharge follow up, repeat cbc/cmp at discharge   Contact information:   [address] [place] [address] [phone]       Follow up with Melvia Heaps, MD In 1 month.   Specialty:  Gastroenterology   Why:  gi bleed, cirrhosis   Contact information:   [address] [place] [address] [phone]        The results of significant diagnostics from this hospitalization (including imaging, microbiology,  ancillary and laboratory) are listed below for reference.    Significant Diagnostic Studies: No results found.  Microbiology: Recent Results (from the past 240 hour(s))  MRSA PCR Screening     Status: None   Collection Time: 04/07/15  7:54 AM  Result Value Ref Range Status   MRSA by PCR NEGATIVE NEGATIVE Final    Comment:        The GeneXpert MRSA Assay (FDA approved for NASAL specimens only), is one component of a comprehensive MRSA colonization surveillance program. It is not intended to diagnose MRSA infection nor to guide or monitor treatment for MRSA infections.      Labs: Basic Metabolic Panel:  Recent Labs Lab 04/06/15 2112 04/07/15 0712 04/07/15 0957 04/08/15 0449 04/09/15 0400  NA 138 135 133* 132* 133*  K 3.2* 4.4 3.6 4.1 4.0  CL 99* 102 100* 102 101  CO2 23 18* 19* 21* 23  GLUCOSE 101* 64* 77 83 113*  BUN 7 7 6  5* <5*  CREATININE 0.52 0.41* 0.48 0.51 0.44  CALCIUM 9.0 8.3* 8.2* 8.6* 9.1  MG  --  1.4*  --  1.3* 1.7  PHOS  --  3.8  --   --   --    Liver Function Tests:  Recent Labs Lab 04/06/15 2112 04/07/15 0712 04/07/15 0957 04/08/15 0449 04/09/15 0400  AST 367* 292* 301* 210* 212*  ALT 86* 72*  77* 56* 62*  ALKPHOS 143* 113 125 101 114  BILITOT 8.6* 7.3* 7.6* 7.7* 6.9*  PROT 8.9* 7.7 8.3* 6.6 7.4  ALBUMIN 3.7 3.2* 3.5 2.7* 3.0*    Recent Labs Lab 04/06/15 2112 04/07/15 0957 04/08/15 0449 04/09/15 0400  LIPASE 108* 62* 63* 74*    Recent Labs Lab 04/06/15 2111  AMMONIA 53*   CBC:  Recent Labs Lab 04/06/15 2112 04/07/15 0658 04/07/15 0957 04/08/15 0449 04/09/15 0400  WBC 6.2 3.8* 4.0 2.8* 2.9*  NEUTROABS 4.1  --   --   --   --   HGB 9.1* 8.0* 8.1* 7.1* 7.2*  HCT 27.1* 23.9* 25.2* 21.2* 21.6*  MCV 95.8 95.2 96.9 96.8 97.7  PLT 97* 75* 79* 60* 56*   Cardiac Enzymes: No results for input(s): CKTOTAL, CKMB, CKMBINDEX, TROPONINI in the last 168 hours. BNP: BNP (last 3 results) No results for input(s): BNP in the last 8760 hours.  ProBNP (last 3 results) No results for input(s): PROBNP in the last 8760 hours.  CBG: No results for input(s): GLUCAP in the last 168 hours.     SignedAlbertine Grates MD, PhD  [facility] 04/09/2015, 12:24 PM

## 2015-04-09 NOTE — Care Management Note (Signed)
Case Management Note  Patient Details  Name: Mary Garrison MRN: [id number] Date of Birth: 05-30-1991  Subjective/Objective:            etoh withdrawal, hep c, cirrhosis,         Action/Plan:Date:  April 09, 2015 U.R. performed for needs and level of care. Will continue to follow for Case Management needs.  Marcelle Smiling, RN, BSN, [place]   [phone]   Expected Discharge Date:                  Expected Discharge Plan:  Home/Self Care  In-House Referral:  Clinical Social Work  Discharge planning Services  CM Consult  Post Acute Care Choice:  NA Choice offered to:  NA  DME Arranged:    DME Agency:     HH Arranged:    HH Agency:     Status of Service:  Completed, signed off  Medicare Important Message Given:    Date Medicare IM Given:    Medicare IM give by:    Date Additional Medicare IM Given:    Additional Medicare Important Message give by:     If discussed at Long Length of Stay Meetings, dates discussed:    Additional Comments:  Golda Acre, RN 04/09/2015, 11:49 AM

## 2015-04-09 NOTE — Telephone Encounter (Signed)
Patient discharged from the hospital today. Message left on her voicemail with appointment information and letter mailed to her home.

## 2015-04-10 ENCOUNTER — Ambulatory Visit: Payer: Medicaid Other | Admitting: Internal Medicine

## 2015-04-17 ENCOUNTER — Encounter: Payer: Self-pay | Admitting: Family Medicine

## 2015-04-17 ENCOUNTER — Ambulatory Visit: Payer: Medicaid Other | Attending: Family Medicine | Admitting: Family Medicine

## 2015-04-17 VITALS — BP 111/70 | HR 68 | Temp 98.0°F | Resp 16 | Ht 65.0 in | Wt 109.0 lb

## 2015-04-17 DIAGNOSIS — G4700 Insomnia, unspecified: Secondary | ICD-10-CM | POA: Diagnosis not present

## 2015-04-17 DIAGNOSIS — R51 Headache: Secondary | ICD-10-CM

## 2015-04-17 DIAGNOSIS — K922 Gastrointestinal hemorrhage, unspecified: Secondary | ICD-10-CM

## 2015-04-17 DIAGNOSIS — F329 Major depressive disorder, single episode, unspecified: Secondary | ICD-10-CM | POA: Diagnosis not present

## 2015-04-17 DIAGNOSIS — F418 Other specified anxiety disorders: Secondary | ICD-10-CM

## 2015-04-17 DIAGNOSIS — K921 Melena: Secondary | ICD-10-CM | POA: Diagnosis not present

## 2015-04-17 DIAGNOSIS — F17210 Nicotine dependence, cigarettes, uncomplicated: Secondary | ICD-10-CM | POA: Diagnosis not present

## 2015-04-17 DIAGNOSIS — R519 Headache, unspecified: Secondary | ICD-10-CM | POA: Insufficient documentation

## 2015-04-17 DIAGNOSIS — R634 Abnormal weight loss: Secondary | ICD-10-CM

## 2015-04-17 MED ORDER — TRAMADOL HCL 50 MG PO TABS: 50.0000 mg | ORAL_TABLET | Freq: Two times a day (BID) | ORAL | Status: DC | PRN

## 2015-04-17 MED ORDER — TRAZODONE HCL 50 MG PO TABS: 25.0000 mg | ORAL_TABLET | Freq: Every evening | ORAL | Status: DC | PRN

## 2015-04-17 NOTE — Assessment & Plan Note (Signed)
A:  UGI bleed in setting of alcohol abuse and known hepatitis  and thrombocytopenia   P:  Avoid alcohol Checking CBC today attempted, unable to draw blood. Patient will return following hydration for blood draw  Keep nose moist with nasal saline or vaseline

## 2015-04-17 NOTE — Progress Notes (Signed)
   Subjective:    Patient ID: Mary Garrison, female    DOB: [birth year], 24 y.o.   MRN: [id number] CC: HFU GI bleed  HPI  1. GI bleed: GI bleeding in setting of alcoholic and Hep C hepatitis. Hep C viral load has spontaneously cleared with undetectable viral load on 03/20/15. Patient has CT evidence of cirrhosis and varices from 02/17/2015.  After a heavy night of drinking,  patient  washospitalized from 8/19-8/22/2016 with UGI bleed. No blood tranfusion given. EGD was not done. She has noted scant coughing up blood and blood in stool. Nosebleeds. No fatigue, CP, palpitations or syncope. Denies drinking ETOH since hospital discharge. Admits to heavy drinking prior to hospitalization.   2. Depression: admits to depression with insomnia. Denies SI. Feels lonely since her son is now back in school. She is not taking an antidepressant.   3. Chronic headaches: frequent severe headaches. Has hx of migraines. Previously treated by neurology.   Social History  Substance Use Topics  . Smoking status: Current Every Day Smoker -- 0.50 packs/day for 10 years    Types: Cigarettes  . Smokeless tobacco: Never Used  . Alcohol Use: 0.0 oz/week    0 Standard drinks or equivalent per week     Comment: heavy drinking at times-- last drink last night-- no longer drinking every day   Review of Systems  Constitutional: Negative for fever and chills.  HENT: Positive for nosebleeds.   Eyes: Negative for visual disturbance.  Respiratory: Positive for cough. Negative for shortness of breath.   Cardiovascular: Negative for chest pain.  Gastrointestinal: Positive for nausea. Negative for vomiting, abdominal pain and blood in stool.  Musculoskeletal: Negative for back pain and arthralgias.  Skin: Negative for rash.  Allergic/Immunologic: Negative for immunocompromised state.  Neurological: Positive for headaches.  Hematological: Negative for adenopathy. Does not bruise/bleed easily.  Psychiatric/Behavioral: Positive  for sleep disturbance and dysphoric mood. Negative for suicidal ideas.  GAD-7: score 5. 1-4 and 6.     Objective:   Physical Exam  Constitutional: She is oriented to person, place, and time. She appears well-developed and well-nourished. No distress.  Eyes: Conjunctivae are normal. Pupils are equal, round, and reactive to light.  Cardiovascular: Normal rate, regular rhythm, normal heart sounds and intact distal pulses.   Pulmonary/Chest: Effort normal and breath sounds normal.  Abdominal: She exhibits no distension and no mass. There is no tenderness. There is no rebound and no guarding.  Musculoskeletal: She exhibits no edema.  Neurological: She is alert and oriented to person, place, and time.  Skin: Skin is warm and dry. No rash noted. There is pallor.  Mottled and pale  Psychiatric: She has a normal mood and affect.  BP 111/70 mmHg  Pulse 68  Temp(Src) 98 F (36.7 C)  Resp 16  Ht  (1.651 m)  Wt 109 lb (49.442 kg)  BMI 18.14 kg/m2  SpO2 100%  LMP 11/30/2014 (Within Days) Wt Readings from Last 3 Encounters:  04/17/15 109 lb (49.442 kg)  04/08/15 103 lb 6.3 oz (46.9 kg)  03/22/15 108 lb (48.988 kg)      Assessment & Plan:

## 2015-04-17 NOTE — Progress Notes (Signed)
Patient here for follow up form the hospital Patient states she was admitted for a gi bleed Patient has a follow up scheduled with DI for 9/23

## 2015-04-17 NOTE — Patient Instructions (Addendum)
Ms. Dunkel,  Thank you for coming in today  1. Bleeding: Avoid alcohol Checking CBC today Keep nose moist with nasal saline or vaseline  2. Depression: referred to mental health   3. chronic headaches: neurology referral   F/u in september for flu shot F/u with me in 2 months for hepatitis   Dr. Armen Pickup

## 2015-04-17 NOTE — Assessment & Plan Note (Signed)
Chronic headaches: neurology referral

## 2015-04-17 NOTE — Assessment & Plan Note (Signed)
A: anxiety and depression in setting of ETOH abuse P: Referral to mental health, list of providers accepting medicaid provided to patient

## 2015-04-18 ENCOUNTER — Telehealth: Payer: Self-pay | Admitting: Clinical

## 2015-04-18 NOTE — Telephone Encounter (Signed)
Introduction to [facility] services. 

## 2015-04-26 ENCOUNTER — Ambulatory Visit: Payer: Medicaid Other | Admitting: *Deleted

## 2015-04-26 DIAGNOSIS — F1010 Alcohol abuse, uncomplicated: Secondary | ICD-10-CM

## 2015-04-26 NOTE — BH Specialist Note (Signed)
Mary Garrison was present for her scheduled appointment today.  Client was oriented times four with good affect and dress. Client was alert and talkative.  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  Client said that she was in the hospital for 3 days but is feeling much better.  Client reported that she has drank a little since last appointment together.  Counselor feels that client is under reporting.  Client stated that nothing major was going on in her life right now other than her "ex"not paying child support on time nor the right amount.  Counselor educated client on different models for addiction.  Client indicated that she is pre disposed to have an addiction and feels that her addiction with substances primarily alcohol has a lot to do with the environment and culture she grew up in where everyone used substances of some kind.  Counselor dicussed with client the idea of breaking the cycle so that her son will not grow up and use substances to deal and cope with life's difficultites.  Counselor discussed with client the need to be referred out after next appointment together is she is still using.  Counselor recommended that client seek substance abuse treatment outside of [facility] as her addiction has not been resoled.  Counselor explained to client that she has been coming to counselor for several months and not a whole lot has changed.  Client stated she did not want to go to substance abuse treatment and felt she could handle it on her own but would consider it.  Client made another appointment with counselor in one month to check back in.   [name], LPCA, MA [facility]

## 2015-05-03 ENCOUNTER — Encounter: Payer: Self-pay | Admitting: Neurology

## 2015-05-03 ENCOUNTER — Encounter: Payer: Medicaid Other | Admitting: Gastroenterology

## 2015-05-03 ENCOUNTER — Ambulatory Visit (INDEPENDENT_AMBULATORY_CARE_PROVIDER_SITE_OTHER): Payer: Medicaid Other | Admitting: Neurology

## 2015-05-03 VITALS — BP 123/69 | HR 95 | Ht 65.0 in | Wt 111.5 lb

## 2015-05-03 DIAGNOSIS — G43019 Migraine without aura, intractable, without status migrainosus: Secondary | ICD-10-CM

## 2015-05-03 HISTORY — DX: Migraine without aura, intractable, without status migrainosus: G43.019

## 2015-05-03 MED ORDER — RIZATRIPTAN BENZOATE 10 MG PO TBDP: 10.0000 mg | ORAL_TABLET | Freq: Three times a day (TID) | ORAL | Status: DC | PRN

## 2015-05-03 MED ORDER — TOPIRAMATE 25 MG PO TABS: ORAL_TABLET | ORAL | Status: DC

## 2015-05-03 NOTE — Progress Notes (Signed)
Reason for visit: Migraine headache  Referring physician: Dr. Debbora Presto is a 24 y.o. female  History of present illness:  Mary Garrison is a 24 year old right-handed white female with a history of hepatitis C and alcoholic hepatitis, cirrhosis of the liver. The patient indicates that she has had a greater than 10 year history of migraine headaches. She has a prominent family history of migraine in the maternal grandmother, mother, and at least 3 of her 8 siblings. The patient indicates that her headaches will come on in general 2 or 3 times a week, lasting less than 24 hours. Sleep will help the headache, and previously taking Tylenol or Advil was helpful, but she cannot take these medications now that she has end-stage liver problems. The patient indicates that her headaches generally will start in the frontal and temporal regions, may be associated with flashing lights in the vision, and some nausea and vomiting. The patient will have a throbbing pain. She has photophobia and phonophobia with the headache. She indicates that stress, and driving at night may bring on the headache. In the past, she has taken Maxalt with good improvement. She denies any neck stiffness, or any allergy symptoms with the headache. She denies any focal numbness or weakness on the face, arms, or legs. She is sent to this office for an evaluation.  Past Medical History  Diagnosis Date  . Hepatitis C 11/2012    she suspects this was from a tattoo as she was dxd with Hep C before starting her IVDA.   Marland Kitchen Asthma     as a child  . Hypophosphatemia 06/29/2014    Phos of 1.4 > 4.0 with supplementation    . Hypokalemia 06/26/2014  . Alcohol addiction   . Polysubstance dependence     including IV heroin. inpt rehab 01/2012.   . C. difficile diarrhea 08/24/2014  . Tachycardia   . Hemorrhoids   . Mallory-Weiss tear   . Common migraine with intractable migraine 05/03/2015    Past Surgical History  Procedure  Laterality Date  . Esophagogastroduodenoscopy N/A 04/07/2015    Procedure: ESOPHAGOGASTRODUODENOSCOPY (EGD);  Surgeon: Louis Meckel, MD;  Location: [facility];  Service: Gastroenterology;  Laterality: N/A;  . Esophagus surgery      Family History  Problem Relation Age of Onset  . Asthma Mother   . Migraines Mother   . Depression Brother   . Other Brother   . Migraines Brother   . Depression Sister   . Other Sister   . Migraines Sister   . Liver cancer Maternal Grandfather   . Pancreatic cancer Maternal Grandfather   . Lung cancer Maternal Grandfather   . Heart disease Maternal Grandmother   . Migraines Maternal Grandmother   . Heart disease Father   . Migraines Brother     Social history:  reports that she has been smoking Cigarettes.  She has a 5 pack-year smoking history. She has never used smokeless tobacco. She reports that she does not drink alcohol or use illicit drugs.  Medications:  Prior to Admission medications   Medication Sig Start Date End Date Taking? Authorizing Provider  albuterol (PROVENTIL HFA;VENTOLIN HFA) 108 (90 BASE) MCG/ACT inhaler Inhale 2 puffs into the lungs every 6 (six) hours as needed. For shortness of breath 03/06/15  Yes Josalyn Funches, MD  calcium-vitamin D (OSCAL WITH D) 500-200 MG-UNIT per tablet Take 1 tablet by mouth 2 (two) times daily. 07/26/14  Yes Dessa Phi, MD  cholecalciferol (  VITAMIN D) 1000 UNITS tablet Take 1 tablet (1,000 Units total) by mouth daily. 07/26/14  Yes Josalyn Funches, MD  Cyanocobalamin (B-12 PO) Take 1 tablet by mouth daily.   Yes Historical Provider, MD  Diclofenac Sodium 3 % GEL Place 1 application onto the skin as needed (for muscle pain). 12/27/14  Yes Dessa Phi, MD  folic acid (FOLVITE) 1 MG tablet Take 1 tablet (1 mg total) by mouth daily. 06/28/14  Yes Josalyn Funches, MD  ondansetron (ZOFRAN ODT) 8 MG disintegrating tablet Take 1 tablet (8 mg total) by mouth every 8 (eight) hours as needed for nausea or  vomiting. 03/06/15  Yes Josalyn Funches, MD  pantoprazole (PROTONIX) 40 MG tablet Take 1 tablet (40 mg total) by mouth daily. 04/09/15  Yes Albertine Grates, MD  potassium chloride SA (K-DUR,KLOR-CON) 20 MEQ tablet Take 1 tablet (20 mEq total) by mouth daily. 03/07/15  Yes Josalyn Funches, MD  Pyridoxine HCl (B-6 PO) Take 1 tablet by mouth daily.   Yes Historical Provider, MD  Thiamine HCl (B-1 PO) Take 1 tablet by mouth daily.   Yes Historical Provider, MD  traMADol (ULTRAM) 50 MG tablet Take 1 tablet (50 mg total) by mouth every 12 (twelve) hours as needed for moderate pain or severe pain. 04/17/15  Yes Dessa Phi, MD  traZODone (DESYREL) 50 MG tablet Take 0.5-1 tablets (25-50 mg total) by mouth at bedtime as needed for sleep. 04/17/15  Yes Josalyn Funches, MD  rizatriptan (MAXALT-MLT) 10 MG disintegrating tablet Take 1 tablet (10 mg total) by mouth 3 (three) times daily as needed for migraine. 05/03/15   York Spaniel, MD  topiramate (TOPAMAX) 25 MG tablet Take one tablet at night for one week, then take 2 tablets at night 05/03/15   York Spaniel, MD      Allergies  Allergen Reactions  . Penicillins     unknown  . Sulfa Antibiotics     unknown    ROS:  Out of a complete 14 system review of symptoms, the patient complains only of the following symptoms, and all other reviewed systems are negative.  Headache  Blood pressure 123/69, pulse 95, height 5\' 5"  (1.651 m), weight 111 lb 8 oz (50.576 kg).  Physical Exam  General: The patient is alert and cooperative at the time of the examination.  Eyes: Pupils are equal, round, and reactive to light. Discs are flat bilaterally. Slight jaundice of the sclera is noted.  Neck: The neck is supple, no carotid bruits are noted.  Respiratory: The respiratory examination is clear.  Cardiovascular: The cardiovascular examination reveals a regular rate and rhythm, no obvious murmurs or rubs are noted.  Skin: Extremities are without significant  edema. Livedo reticularis is noted.  Neurologic Exam  Mental status: The patient is alert and oriented x 3 at the time of the examination. The patient has apparent normal recent and remote memory, with an apparently normal attention span and concentration ability.  Cranial nerves: Facial symmetry is present. There is good sensation of the face to pinprick and soft touch bilaterally. The strength of the facial muscles and the muscles to head turning and shoulder shrug are normal bilaterally. Speech is well enunciated, no aphasia or dysarthria is noted. Extraocular movements are full. Visual fields are full. The tongue is midline, and the patient has symmetric elevation of the soft palate. No obvious hearing deficits are noted.  Motor: The motor testing reveals 5 over 5 strength of all 4 extremities. Good symmetric motor tone  is noted throughout.  Sensory: Sensory testing is intact to pinprick, soft touch, vibration sensation, and position sense on all 4 extremities. No evidence of extinction is noted.  Coordination: Cerebellar testing reveals good finger-nose-finger and heel-to-shin bilaterally.  Gait and station: Gait is normal. Tandem gait is normal. Romberg is negative. No drift is seen.  Reflexes: Deep tendon reflexes are symmetric and normal bilaterally. Toes are downgoing bilaterally.   Assessment/Plan:  1. Common migraine  2. Alcoholic and hepatitis C associated hepatitis  The patient has been off of alcohol now for a little over one month. The patient has frequent headaches, her headache frequency has not changed, but she has not been able to take her usual rescue medications. She will be placed on Topamax to take daily, and she will be given Maxalt to take if needed. She will follow-up in about 4 months. She is to contact me if she has side effects on the medication.  Mary Palau MD 05/03/2015 3:39 PM  [facility] [address] [place],  [place] [address]  Phone [phone] Fax [phone]

## 2015-05-03 NOTE — Patient Instructions (Addendum)
We will start on Topamax for the migraine to take daily to prevent the headache, and take maxalt if needed for the headache when is comes on.  Topamax (topiramate) is a seizure medication that has an FDA approval for seizures and for migraine headache. Potential side effects of this medication include weight loss, cognitive slowing, tingling in the fingers and toes, and carbonated drinks will taste bad. If any significant side effects are noted on this drug, please contact our office.  Migraine Headache A migraine headache is an intense, throbbing pain on one or both sides of your head. A migraine can last for 30 minutes to several hours. CAUSES  The exact cause of a migraine headache is not always known. However, a migraine may be caused when nerves in the brain become irritated and release chemicals that cause inflammation. This causes pain. Certain things may also trigger migraines, such as:  Alcohol.  Smoking.  Stress.  Menstruation.  Aged cheeses.  Foods or drinks that contain nitrates, glutamate, aspartame, or tyramine.  Lack of sleep.  Chocolate.  Caffeine.  Hunger.  Physical exertion.  Fatigue.  Medicines used to treat chest pain (nitroglycerine), birth control pills, estrogen, and some blood pressure medicines. SIGNS AND SYMPTOMS  Pain on one or both sides of your head.  Pulsating or throbbing pain.  Severe pain that prevents daily activities.  Pain that is aggravated by any physical activity.  Nausea, vomiting, or both.  Dizziness.  Pain with exposure to bright lights, loud noises, or activity.  General sensitivity to bright lights, loud noises, or smells. Before you get a migraine, you may get warning signs that a migraine is coming (aura). An aura may include:  Seeing flashing lights.  Seeing bright spots, halos, or zigzag lines.  Having tunnel vision or blurred vision.  Having feelings of numbness or tingling.  Having trouble  talking.  Having muscle weakness. DIAGNOSIS  A migraine headache is often diagnosed based on:  Symptoms.  Physical exam.  A CT scan or MRI of your head. These imaging tests cannot diagnose migraines, but they can help rule out other causes of headaches. TREATMENT Medicines may be given for pain and nausea. Medicines can also be given to help prevent recurrent migraines.  HOME CARE INSTRUCTIONS  Only take over-the-counter or prescription medicines for pain or discomfort as directed by your health care provider. The use of long-term narcotics is not recommended.  Lie down in a dark, quiet room when you have a migraine.  Keep a journal to find out what may trigger your migraine headaches. For example, write down:  What you eat and drink.  How much sleep you get.  Any change to your diet or medicines.  Limit alcohol consumption.  Quit smoking if you smoke.  Get 7-9 hours of sleep, or as recommended by your health care provider.  Limit stress.  Keep lights dim if bright lights bother you and make your migraines worse. SEEK IMMEDIATE MEDICAL CARE IF:   Your migraine becomes severe.  You have a fever.  You have a stiff neck.  You have vision loss.  You have muscular weakness or loss of muscle control.  You start losing your balance or have trouble walking.  You feel faint or pass out.  You have severe symptoms that are different from your first symptoms. MAKE SURE YOU:   Understand these instructions.  Will watch your condition.  Will get help right away if you are not doing well or get worse.  Document Released: 08/04/2005 Document Revised: 12/19/2013 Document Reviewed: 04/11/2013 [facility] Patient Information 2015 [place], [place]. This information is not intended to replace advice given to you by your health care provider. Make sure you discuss any questions you have with your health care provider.

## 2015-05-11 ENCOUNTER — Encounter: Payer: Self-pay | Admitting: Gastroenterology

## 2015-05-11 ENCOUNTER — Ambulatory Visit (INDEPENDENT_AMBULATORY_CARE_PROVIDER_SITE_OTHER): Payer: Medicaid Other | Admitting: Gastroenterology

## 2015-05-11 VITALS — BP 100/52 | HR 100 | Ht 65.35 in | Wt 112.2 lb

## 2015-05-11 DIAGNOSIS — K920 Hematemesis: Secondary | ICD-10-CM

## 2015-05-11 DIAGNOSIS — R110 Nausea: Secondary | ICD-10-CM

## 2015-05-11 DIAGNOSIS — B182 Chronic viral hepatitis C: Secondary | ICD-10-CM | POA: Diagnosis not present

## 2015-05-11 DIAGNOSIS — K7031 Alcoholic cirrhosis of liver with ascites: Secondary | ICD-10-CM

## 2015-05-11 NOTE — Assessment & Plan Note (Signed)
Fortunately she continues to drink.  She was admonished to 2 try to desist from alcohol use entirely

## 2015-05-11 NOTE — Patient Instructions (Signed)
Follow up as needed

## 2015-05-11 NOTE — Assessment & Plan Note (Signed)
Been secondary to Mallory-Weiss tear - resolved

## 2015-05-11 NOTE — Assessment & Plan Note (Addendum)
It appears that that she spontaneously cleared hepatitis c virus

## 2015-05-11 NOTE — Progress Notes (Signed)
      History of Present Illness:  Ms. Brinkmeier has returned following hospitalization for an acute upper GI bleed secondary to a Mallory-Weiss tear.  She's had no specific problems since discharge.  She admits to drinking on at least a couple of occasions repeat hepatitis C serologies were negative for detectable virus.  She did not undergo therapy.    Review of Systems: Pertinent positive and negative review of systems were noted in the above HPI section. All other review of systems were otherwise negative.    Current Medications, Allergies, Past Medical History, Past Surgical History, Family History and Social History were reviewed in [facility] electronic medical record  Vital signs were reviewed in today's medical record. Physical Exam: General: Thin, chronically ill-appearing female Skin: anicteric Head: Normocephalic and atraumatic Eyes:  sclerae anicteric, EOMI Ears: Normal auditory acuity Mouth: No deformity or lesions Lymph Nodes: no lymphadenopathy Lungs: Clear throughout to auscultation Heart: Regular rate and rhythm; no murmurs, rubs or brui: Gastroinestinal:  Soft, non tender and non distended. No masses, hepatosplenomegaly or hernias noted. Normal Bowel sounds Rectal:deferred Musculoskeletal: Symmetrical with no gross deformities  Pulses:  Normal pulses noted Extremities: No clubbing, cyanosis, edema or deformities noted Neurological: Alert oriented x 4, grossly nonfocal Psychological:  Alert and cooperative. Normal mood and affect  See Assessment and Plan under Problem List

## 2015-05-20 ENCOUNTER — Emergency Department (HOSPITAL_COMMUNITY)
Admission: EM | Admit: 2015-05-20 | Discharge: 2015-05-20 | Disposition: A | Discharge status: Home / Self Care | Payer: Medicaid Other | Source: Home / Self Care | Attending: Emergency Medicine | Admitting: Emergency Medicine

## 2015-05-20 ENCOUNTER — Emergency Department (HOSPITAL_COMMUNITY): Payer: Medicaid Other

## 2015-05-20 ENCOUNTER — Encounter (HOSPITAL_COMMUNITY): Payer: Self-pay | Admitting: *Deleted

## 2015-05-20 ENCOUNTER — Emergency Department (HOSPITAL_COMMUNITY)
Admission: EM | Admit: 2015-05-20 | Discharge: 2015-05-20 | Disposition: A | Discharge status: Home / Self Care | Payer: Medicaid Other | Attending: Emergency Medicine | Admitting: Emergency Medicine

## 2015-05-20 DIAGNOSIS — Y9389 Activity, other specified: Secondary | ICD-10-CM | POA: Insufficient documentation

## 2015-05-20 DIAGNOSIS — Z880 Allergy status to penicillin: Secondary | ICD-10-CM | POA: Diagnosis not present

## 2015-05-20 DIAGNOSIS — Z79899 Other long term (current) drug therapy: Secondary | ICD-10-CM | POA: Insufficient documentation

## 2015-05-20 DIAGNOSIS — S01511A Laceration without foreign body of lip, initial encounter: Secondary | ICD-10-CM | POA: Diagnosis not present

## 2015-05-20 DIAGNOSIS — S60221A Contusion of right hand, initial encounter: Secondary | ICD-10-CM | POA: Diagnosis not present

## 2015-05-20 DIAGNOSIS — Y9289 Other specified places as the place of occurrence of the external cause: Secondary | ICD-10-CM | POA: Insufficient documentation

## 2015-05-20 DIAGNOSIS — F1020 Alcohol dependence, uncomplicated: Secondary | ICD-10-CM

## 2015-05-20 DIAGNOSIS — F1010 Alcohol abuse, uncomplicated: Secondary | ICD-10-CM | POA: Diagnosis not present

## 2015-05-20 DIAGNOSIS — E876 Hypokalemia: Secondary | ICD-10-CM | POA: Insufficient documentation

## 2015-05-20 DIAGNOSIS — W010XXA Fall on same level from slipping, tripping and stumbling without subsequent striking against object, initial encounter: Secondary | ICD-10-CM | POA: Diagnosis not present

## 2015-05-20 DIAGNOSIS — Z8619 Personal history of other infectious and parasitic diseases: Secondary | ICD-10-CM | POA: Insufficient documentation

## 2015-05-20 DIAGNOSIS — Z3202 Encounter for pregnancy test, result negative: Secondary | ICD-10-CM | POA: Insufficient documentation

## 2015-05-20 DIAGNOSIS — J45909 Unspecified asthma, uncomplicated: Secondary | ICD-10-CM | POA: Diagnosis not present

## 2015-05-20 DIAGNOSIS — G43909 Migraine, unspecified, not intractable, without status migrainosus: Secondary | ICD-10-CM | POA: Insufficient documentation

## 2015-05-20 DIAGNOSIS — Y998 Other external cause status: Secondary | ICD-10-CM | POA: Diagnosis not present

## 2015-05-20 DIAGNOSIS — Z720 Tobacco use: Secondary | ICD-10-CM | POA: Diagnosis not present

## 2015-05-20 DIAGNOSIS — Z8719 Personal history of other diseases of the digestive system: Secondary | ICD-10-CM | POA: Diagnosis not present

## 2015-05-20 DIAGNOSIS — F10129 Alcohol abuse with intoxication, unspecified: Secondary | ICD-10-CM | POA: Diagnosis present

## 2015-05-20 LAB — CBC WITH DIFFERENTIAL/PLATELET
BASOS ABS: 0 10*3/uL (ref 0.0–0.1)
Basophils Relative: 0 %
EOS PCT: 0 %
Eosinophils Absolute: 0 10*3/uL (ref 0.0–0.7)
HEMATOCRIT: 24.4 % — AB (ref 36.0–46.0)
Hemoglobin: 8.1 g/dL — ABNORMAL LOW (ref 12.0–15.0)
Lymphocytes Relative: 22 %
Lymphs Abs: 1.1 10*3/uL (ref 0.7–4.0)
MCH: 29.1 pg (ref 26.0–34.0)
MCHC: 33.2 g/dL (ref 30.0–36.0)
MCV: 87.8 fL (ref 78.0–100.0)
MONOS PCT: 5 %
Monocytes Absolute: 0.2 10*3/uL (ref 0.1–1.0)
NEUTROS PCT: 73 %
Neutro Abs: 3.5 10*3/uL (ref 1.7–7.7)
PLATELETS: 56 10*3/uL — AB (ref 150–400)
RBC: 2.78 MIL/uL — AB (ref 3.87–5.11)
RDW: 18.2 % — AB (ref 11.5–15.5)
WBC: 4.8 10*3/uL (ref 4.0–10.5)

## 2015-05-20 LAB — COMPREHENSIVE METABOLIC PANEL
ALBUMIN: 3.9 g/dL (ref 3.5–5.0)
ALT: 32 U/L (ref 14–54)
AST: 97 U/L — AB (ref 15–41)
Alkaline Phosphatase: 131 U/L — ABNORMAL HIGH (ref 38–126)
Anion gap: 11 (ref 5–15)
BILIRUBIN TOTAL: 4.5 mg/dL — AB (ref 0.3–1.2)
BUN: 6 mg/dL (ref 6–20)
CHLORIDE: 101 mmol/L (ref 101–111)
CO2: 25 mmol/L (ref 22–32)
CREATININE: 0.35 mg/dL — AB (ref 0.44–1.00)
Calcium: 8.6 mg/dL — ABNORMAL LOW (ref 8.9–10.3)
GFR calc Af Amer: >60 mL/min (ref >60)
GLUCOSE: 108 mg/dL — AB (ref 65–99)
POTASSIUM: 2.9 mmol/L — AB (ref 3.5–5.1)
Sodium: 137 mmol/L (ref 135–145)
Total Protein: 8.4 g/dL — ABNORMAL HIGH (ref 6.5–8.1)

## 2015-05-20 LAB — URINALYSIS, ROUTINE W REFLEX MICROSCOPIC
Glucose, UA: NEGATIVE mg/dL
KETONES UR: NEGATIVE mg/dL
NITRITE: POSITIVE — AB
PROTEIN: 100 mg/dL — AB
SPECIFIC GRAVITY, URINE: 1.02 (ref 1.005–1.030)
UROBILINOGEN UA: 2 mg/dL — AB (ref 0.0–1.0)
pH: 6 (ref 5.0–8.0)

## 2015-05-20 LAB — I-STAT BETA HCG BLOOD, ED (MC, WL, AP ONLY)

## 2015-05-20 LAB — URINE MICROSCOPIC-ADD ON

## 2015-05-20 LAB — PROTIME-INR
INR: 1.55 — ABNORMAL HIGH (ref 0.00–1.49)
Prothrombin Time: 18.7 seconds — ABNORMAL HIGH (ref 11.6–15.2)

## 2015-05-20 LAB — RAPID URINE DRUG SCREEN, HOSP PERFORMED
AMPHETAMINES: NOT DETECTED
BENZODIAZEPINES: NOT DETECTED
Barbiturates: NOT DETECTED
COCAINE: NOT DETECTED
OPIATES: NOT DETECTED
TETRAHYDROCANNABINOL: NOT DETECTED

## 2015-05-20 LAB — APTT: APTT: 32 s (ref 24–37)

## 2015-05-20 LAB — ETHANOL
ALCOHOL ETHYL (B): 284 mg/dL — AB (ref <5)
Alcohol, Ethyl (B): 431 mg/dL (ref <5)

## 2015-05-20 MED ORDER — SODIUM CHLORIDE 0.9 % IV SOLN: INTRAVENOUS | Status: DC

## 2015-05-20 MED ORDER — LORAZEPAM 1 MG PO TABS: 0.0000 mg | ORAL_TABLET | Freq: Four times a day (QID) | ORAL | Status: DC

## 2015-05-20 MED ORDER — ONDANSETRON 4 MG PO TBDP
4.0000 mg | ORAL_TABLET | Freq: Once | ORAL | Status: AC
  Administered 2015-05-20: 4 mg via ORAL
  Filled 2015-05-20: qty 1

## 2015-05-20 MED ORDER — CHLORDIAZEPOXIDE HCL 25 MG PO CAPS: ORAL_CAPSULE | ORAL | Status: DC

## 2015-05-20 MED ORDER — CIPROFLOXACIN HCL 500 MG PO TABS: 500.0000 mg | ORAL_TABLET | Freq: Two times a day (BID) | ORAL | Status: DC

## 2015-05-20 MED ORDER — LIDOCAINE-EPINEPHRINE 2 %-1:100000 IJ SOLN
INTRAMUSCULAR | Status: AC
  Administered 2015-05-20: 10 mL
  Filled 2015-05-20: qty 1

## 2015-05-20 MED ORDER — LIDOCAINE-EPINEPHRINE 2 %-1:100000 IJ SOLN
10.0000 mL | Freq: Once | INTRAMUSCULAR | Status: AC
Start: 2015-05-20 — End: 2015-05-20
  Administered 2015-05-20: 10 mL

## 2015-05-20 MED ORDER — POTASSIUM CHLORIDE CRYS ER 20 MEQ PO TBCR
60.0000 meq | EXTENDED_RELEASE_TABLET | Freq: Once | ORAL | Status: AC
  Administered 2015-05-20: 60 meq via ORAL
  Filled 2015-05-20: qty 3

## 2015-05-20 MED ORDER — LORAZEPAM 1 MG PO TABS: 0.0000 mg | ORAL_TABLET | Freq: Two times a day (BID) | ORAL | Status: DC

## 2015-05-20 MED ORDER — SODIUM CHLORIDE 0.9 % IV BOLUS (SEPSIS)
1000.0000 mL | Freq: Once | INTRAVENOUS | Status: AC
  Administered 2015-05-20: 1000 mL via INTRAVENOUS

## 2015-05-20 NOTE — ED Provider Notes (Signed)
Pt was sent home from the [facility] ED.  After being discharged Pysch team noticed that pts urine is abnormal.  UA is positive  for nitrite and leukocyte esterase and has many bacteria with only a few squamous epithelial cells.    Pt is not in the ED to see if she is having symptoms.  Will send off a urine culture.  Will ask her if she is having any symptoms.  Rx for cipro if symptomatic.  Linwood Dibbles, MD 05/20/15 1250

## 2015-05-20 NOTE — ED Notes (Signed)
Arrives per [employer] with IVC papers stating that patient wanting to harm self and that she suffers with depression. Noted patient bleeding from lower lip. Patient reports that she has not eaten in 6 days and that she might be pregnant. States that she does not want a child at this time. Reports her last drink was at 1300 on 05/19/15. ABC's intact. Answers all questions appropriately

## 2015-05-20 NOTE — Progress Notes (Signed)
12:27am. CSW and psych NP met with pt and pt's grandmother to discuss disposition. At this time, pt is declining inpatient substance abuse treatment. She states she will follow up outpatient. Pt had resource list provided by counselor beside her bed. She denies SI, HI, AVH. Grandmother expresses concern that pt will not follow up. Grandmother also expresses concern that magistrate will file charges against her since he IVC'd her for "seven days." (this is not true; pt came to hospital under regular IVC affidavit/custody order) CSW and NP provided education on IVC process, and advised that pt is not meeting criteria for involuntary commitment. Discussed how  CSW reviewed resource list, highlighting local inpatient and outpatient substance abuse treatment resources.  CSW to sign off.  [facility] phone: [phone]

## 2015-05-20 NOTE — ED Notes (Signed)
Patient up in room, called grandmother.

## 2015-05-20 NOTE — Discharge Instructions (Signed)
Alcohol Withdrawal °Anytime drug use is interfering with normal living activities it has become abuse. This includes problems with family and friends. Psychological dependence has developed when your mind tells you that the drug is needed. This is usually followed by physical dependence when a continuing increase of drugs are required to get the same feeling or "high." This is known as addiction or chemical dependency. A person's risk is much higher if there is a history of chemical dependency in the family. °Mild Withdrawal Following Stopping Alcohol, When Addiction or Chemical Dependency Has Developed °When a person has developed tolerance to alcohol, any sudden stopping of alcohol can cause uncomfortable physical symptoms. Most of the time these are mild and consist of tremors in the hands and increases in heart rate, breathing, and temperature. Sometimes these symptoms are associated with anxiety, panic attacks, and bad dreams. There may also be stomach upset. Normal sleep patterns are often interrupted with periods of inability to sleep (insomnia). This may last for 6 months. Because of this discomfort, many people choose to continue drinking to get rid of this discomfort and to try to feel normal. °Severe Withdrawal with Decreased or No Alcohol Intake, When Addiction or Chemical Dependency Has Developed °About five percent of alcoholics will develop signs of severe withdrawal when they stop using alcohol. One sign of this is development of generalized seizures (convulsions). Other signs of this are severe agitation and confusion. This may be associated with believing in things which are not real or seeing things which are not really there (delusions and hallucinations). Vitamin deficiencies are usually present if alcohol intake has been long-term. Treatment for this most often requires hospitalization and close observation. °Addiction can only be helped by stopping use of all chemicals. This is hard but may  save your life. With continual alcohol use, possible outcomes are usually loss of self respect and esteem, violence, and death. °Addiction cannot be cured but it can be stopped. This often requires outside help and the care of professionals. Treatment centers are listed in the yellow pages under Cocaine, Narcotics, and Alcoholics Anonymous. Most hospitals and clinics can refer you to a specialized care center. °It is not necessary for you to go through the uncomfortable symptoms of withdrawal. Your caregiver can provide you with medicines that will help you through this difficult period. Try to avoid situations, friends, or drugs that made it possible for you to keep using alcohol in the past. Learn how to say no. °It takes a long period of time to overcome addictions to all drugs, including alcohol. There may be many times when you feel as though you want a drink. After getting rid of the physical addiction and withdrawal, you will have a lessening of the craving which tells you that you need alcohol to feel normal. Call your caregiver if more support is needed. Learn who to talk to in your family and among your friends so that during these periods you can receive outside help. Alcoholics Anonymous (AA) has helped many people over the years. To get further help, contact AA or call your caregiver, counselor, or clergyperson. Al-Anon and Alateen are support groups for friends and family members of an alcoholic. The people who love and care for an alcoholic often need help, too. For information about these organizations, check your phone directory or call a local alcoholism treatment center.  °SEEK IMMEDIATE MEDICAL CARE IF:  °· You have a seizure. °· You have a fever. °· You experience uncontrolled vomiting or you   vomit up blood. This may be bright red or look like black coffee grounds. °· You have blood in the stool. This may be bright red or appear as a black, tarry, bad-smelling stool. °· You become lightheaded or  faint. Do not drive if you feel this way. Have someone else drive you or call 911 for help. °· You become more agitated or confused. °· You develop uncontrolled anxiety. °· You begin to see things that are not really there (hallucinate). °Your caregiver has determined that you completely understand your medical condition, and that your mental state is back to normal. You understand that you have been treated for alcohol withdrawal, have agreed not to drink any alcohol for a minimum of 1 day, will not operate a car or other machinery for 24 hours, and have had an opportunity to ask any questions about your condition. °Document Released: 05/14/2005 Document Revised: 10/27/2011 Document Reviewed: 03/22/2008 °ExitCare® Patient Information ©2015 [employer]. This information is not intended to replace advice given to you by your health care provider. Make sure you discuss any questions you have with your health care provider. ° ° ° °Emergency Department Resource Guide °1) Find a Doctor and Pay Out of Pocket °Although you won't have to find out who is covered by your insurance plan, it is a good idea to ask around and get recommendations. You will then need to call the office and see if the doctor you have chosen will accept you as a new patient and what types of options they offer for patients who are self-pay. Some doctors offer discounts or will set up payment plans for their patients who do not have insurance, but you will need to ask so you aren't surprised when you get to your appointment. ° °2) Contact Your Local Health Department °Not all health departments have doctors that can see patients for sick visits, but many do, so it is worth a call to see if yours does. If you don't know where your local health department is, you can check in your phone book. The CDC also has a tool to help you locate your state's health department, and many state websites also have listings of all of their local health  departments. ° °3) Find a Walk-in Clinic °If your illness is not likely to be very severe or complicated, you may want to try a walk in clinic. These are popping up all over the country in pharmacies, drugstores, and shopping centers. They're usually staffed by nurse practitioners or physician assistants that have been trained to treat common illnesses and complaints. They're usually fairly quick and inexpensive. However, if you have serious medical issues or chronic medical problems, these are probably not your best option. ° °No Primary Care Doctor: °- Call [facility] at  832-8000 - they can help you locate a primary care doctor that  accepts your insurance, provides certain services, etc. °- Physician Referral Service- 1-800-533-3463 ° °Chronic Pain Problems: °Organization         Address  Phone   Notes  °[facility]  (336) 297-2271 Patients need to be referred by their primary care doctor.  ° °Medication Assistance: °Organization         Address  Phone   Notes  °[place] [facility] [address] °[place], [place] [address] (336) 641-8030 --Must be a resident of [place] °-- Must have NO insurance coverage whatsoever (no Medicaid/ Medicare, etc.) °-- The pt. MUST have   a primary care doctor that directs their care regularly and follows them in the community °  °[facility]  (866) 331-1348   °[employer]  (888) 892-1162   ° °Agencies that provide inexpensive medical care: °Organization         Address  Phone   Notes  °[facility]  (336) 832-8035   °[facility]    (336) 832-7272   °[facility] [address] °[place], [place] [address] (336) 832-4777   °[facility] [address], °[place] (336) 271-4999   °[facility]    (336) 373-0678   °[facility]    (336) 272-1050   °[facility] ° [address], [place] Phone:  (336)  832-4444, Fax:  (336) 832-4440 Hours of Operation:  9 am - 6 pm, M-F.  Also accepts Medicaid/Medicare and self-pay.  °[facility] ° [address], [place] Phone: (336) 832-3150, Fax: (336) 832-3151. Hours of Operation:  8:30 am - 5:30 pm, M-F.  Also accepts Medicaid and self-pay.  °[facility] [place] [address], [place] Phone: (336) 878-6027   °[facility] [address], [place], [place] (336)723-1848, Ext. 123 Mondays & Thursdays: 7-9 AM.  First 15 patients are seen on a first come, first serve basis. °  ° °Medicaid-accepting [place] Providers: ° °Organization         Address  Phone   Notes  °[facility] [address], [place] (336) 641-2100 Also accepts self-pay patients.  °[facility] [address], [place] ° (336) 856-9996   °[facility] [address], [place] (336) 288-8857   °[facility] [address] [place] [address], [place] (336) 299-7000   °Veita Bland [address], [place]  ° (336) 373-1557 Only accepts [place] Access Medicaid patients after they have their name applied to their card.  ° °Self-Pay (no insurance) in [place]: ° °Organization         Address  Phone   Notes  °Sickle Cell Patients, [facility] [address], [place] (336) 832-1970   °[facility] [address], [place] (336) 832-4400   ° [facility] ° [address], [place] (336) 992-4800   °[facility]/Dr. Osei-Bonsu ° [address] [place] [address], [place] or [address], [place] (336) 841-8500 Phone number for both [place] and [place] locations is the same.  °[facility] [address], [place] (336) 299-0000   °[facility] [address] [place] [address], [place] or [address] (336)  852-7530 °(336) 878-2260   °[facility] [address],  (336) 350-1642, phone; (336) 294-5005, fax Sees patients 1st and 3rd Saturday of every month.  Must not qualify for public or private insurance (i.e. Medicaid, Medicare, [place] Health Choice, Veterans' Benefits) • Household income should be no more than 200% of the poverty level •The clinic cannot treat you if you are pregnant or think you are pregnant • Sexually transmitted diseases are not treated at the clinic.  ° ° °Dental Care: °Organization         Address  Phone  Notes  °[facility] [address],  (  336) 641-6152 Accepts children up to age 21 who are enrolled in Medicaid or [place] Health Choice; pregnant women with a Medicaid card; and children who have applied for Medicaid or [place] Health Choice, but were declined, whose parents can pay a reduced fee at time of service.  °[facility] [place]  [address], [place] (336) 641-7733 Accepts children up to age 21 who are enrolled in Medicaid or [place] Health Choice; pregnant women with a Medicaid card; and children who have applied for Medicaid or [place] Health Choice, but were declined, whose parents can pay a reduced fee at time of service.  °[facility] ° [address], [place] (336) 641-4533 Patients are seen by appointment only. Walk-ins are not accepted. [facility] will see patients 18 years of age and older. °Monday - Tuesday (8am-5pm) °Most Wednesdays (8:30-5pm) °$30 per visit, cash only  °[facility] ° [address], [place] (336) 641-4533 Patients are seen by appointment only. Walk-ins are not accepted. [facility] will see patients 18 years of age and older. °One Wednesday Evening (Monthly: Volunteer Based).  $30 per visit, cash only  °[facility]  (919) 537-3737 for adults;  Children under age 4, call Graduate Pediatric Dentistry at ([phone]) [phone]. Children aged 4-14, please call (919) [phone] to request a pediatric application. ° Dental services are provided in all areas of dental care including fillings, crowns and bridges, complete and partial dentures, implants, gum treatment, root canals, and extractions. Preventive care is also provided. Treatment is provided to both adults and children. °Patients are selected via a lottery and there is often a waiting list. °  °[facility] [address], °[place] ° (336) 763-8833 [email or web address] °  °[facility] [address], [place], [place] (336)723-1848, Ext. 123 Second and Fourth Thursday of each month, opens at 6:30 AM; Clinic ends at 9 AM.  Patients are seen on a first-come first-served basis, and a limited number are seen during each clinic.  ° °[facility] ° [address], [place], [place] (336) 723-7904   Eligibility Requirements °You must have lived in [place], [place], or [place] counties for at least the last three months. °  You cannot be eligible for state or federal sponsored healthcare insurance, including Veterans Administration, Medicaid, or Medicare. °  You generally cannot be eligible for healthcare insurance through your employer.  °  How to apply: °Eligibility screenings are held every Tuesday and Wednesday afternoon from 1:00 pm until 4:00 pm. You do not need an appointment for the interview!  °[facility] [address], [place], [place] 336-631-2330   °[facility]  336-342-8273   °[facility]  336-703-3100   °[facility]  336-570-6415   ° °Behavioral Health Resources in the Community: °Intensive Outpatient Programs °Organization         Address  Phone  Notes  °[facility] [address], [place], [place] 336-878-6098   ° [facility] [address], [place], [place] 336-832-9800   °[facility] [address], [place], [place] ° 336-882-2125   °[facility] [address],  °[place], [place] 1-800-853-5163 or 336-641-4981   °Substance Abuse Resources °Organization         Address  Phone  Notes  °[facility]    336-882-2125   °[facility]  336-784-9470   °[facility]  336-285-9073   °[facility]  336-845-3988   °Residential & Outpatient Substance Abuse Program  1-800-659-3381   °Psychological Services °Organization         Address  Phone  Notes  °[facility]  336- 832-9600   °[facility]  336- 378-7881   °[facility] [address], [place] 1-800-853-5163 or 336-641-4981   ° °Mobile Crisis Teams °Organization         Address  Phone  Notes  °[facility], Mobile Crisis Care Unit  1-877-626-1772   °Assertive °[facility] ° [address] [place], [place] 336-834-9664   °Sharon DeEsch [address] °[place] 336-554-5454   ° °Self-Help/Support Groups °Organization         Address  Phone             Notes  °[facility] - variety of support groups  336- 373-1402 Call for more information  °Narcotics Anonymous (NA), [facility] [address], °[place]  2 meetings at this location  ° °Residential Treatment Programs °Organization         Address  Phone  Notes  °[facility] [address],    °[place]  1-866-801-8205   °[facility] ° [address], [place], [place] 704-293-8524   °[facility] [address], [place] 336-845-3988 Admissions: 8am-3pm M-F  °[facility] [address]    °[place], [place] 336-841-1104   °[facility] [address], [place], [place] 336-379-7146   °[facility] [address]  °[place], [place] 336-285-9073   °[facility] - Intensive  Outpatient [address], [place], [place] 336-852-3033   °[facility] [address]  °[place], [place] 1-877-615-2722 or 336-784-9470   °[facility] [address] [place], [place] 336-227-7417 Accepts Medicaid  °[facility] [address]  °[place] 1-800-659-3381 Substance Abuse/Addiction Treatment  ° °[place] Behavioral Health Resources °Organization         Address  Phone  Notes  °[facility]  (888) 581-9988   °Julie Brannon, PhD [address] [place], [place]   (336) 349-5553 or (336) 951-0000   °[facility]   [address] °[place], [place] (336) 349-4454   °[facility] [address], [place], [place] (336) 342-8316 Insurance/Medicaid/sponsorship through [facility]  °[facility] [address]                                    [place], [place] (336) 342-8316 Therapy/tele-psych/case  °[facility] [address]  ° [place], [place] (336) 349-2233    °Dr. Arfeen  (336) 349-4544   °[facility] [place]  [employer] [place] [facility] 1) [address], [place] °2) [address], [place] °3)  [address], [place] (336) 349-3220 °(336) 342-7768 ° °(336) 342-8140   °[place] [facility] (336) 342-1394 or (336) 342-3537 (After Hours)    ° ° ° °

## 2015-05-20 NOTE — ED Notes (Signed)
Patient ambulated to bathroom, gait stable. AAOx4 speaking in complete sentences.

## 2015-05-20 NOTE — Consult Note (Signed)
[facility] Consult   Reason for Consult:  Alcohol use disorder, severe dependence Referring Physician: EDP Patient Identification: Mary Garrison MRN:  [id number] Principal Diagnosis: Alcohol use disorder, severe, dependence ([place]) Diagnosis:   Patient Active Problem List   Diagnosis Date Noted  . Alcohol use disorder, severe, dependence ([place]) [F10.20] 05/20/2015    Priority: High  . Common migraine with intractable migraine [G43.019] 05/03/2015  . Insomnia [G47.00] 04/17/2015  . Chronic headache [R51] 04/17/2015  . Alcoholic hepatitis without ascites [K70.10]   . Acute blood loss anemia [D62]   . GI bleed [K92.2] 04/07/2015  . Hypokalemia [E87.6] 04/07/2015  . Mallory-Weiss tear [K22.6] 04/07/2015  . Alcoholic cirrhosis of liver without ascites ([place]) [K70.30]   . Hepatic cirrhosis ([place]) [K74.60] 03/20/2015  . Cough [R05] 03/06/2015  . Headache [R51] 03/06/2015  . Dysuria [R30.0] 03/06/2015  . Malnutrition of moderate degree ([place]) [E44.0] 02/17/2015  . Acute alcoholic hepatitis [L89.21] 02/17/2015  . Acute renal failure syndrome ([place]) [N17.9]   . Ascites [R18.8] 02/13/2015  . Muscle pain [M79.1] 12/27/2014  . Chronic anemia [D64.9] 12/18/2014  . Hypokalemia, inadequate intake [E87.6] 12/18/2014  . Well woman exam [Z00.00] 11/03/2014  . Tachycardia [R00.0] 10/09/2014  . GERD (gastroesophageal reflux disease) [K21.9] 10/09/2014  . Alcohol abuse [F10.10] 08/20/2014  . Tobacco abuse [Z72.0] 08/20/2014  . Vitamin D insufficiency [E55.9] 07/26/2014  . Anemia [D64.9] 06/26/2014  . Loss of weight [R63.4] 06/21/2014  . Depression with anxiety [F41.8] 06/21/2014  . Asthma [J45.909]   . Chronic hepatitis C without hepatic coma ([place]) [B18.2] 11/16/2013    Total Time spent with patient: 1 hour  Subjective:   Mary Garrison is a 23 y.o. female patient admitted with Alcohol use disorder, severe, dependence.  HPI:  Caucasian female, 24 years old was evaluated for  Alcohol intoxication.  Patient was brought in under IVC taken out by her grandmother for excessive drinking of Alcohol.  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  Patient admitted to drinking a "lot of Alcohol"   She admitted to using Opiates in the past but is now using only Alcohol.  She started drinking heavily since May of this year.  She reports drinking 1/2 bottle of Whisky daily and feels sloppy after drinking.  She reports several falls while intoxicated and she fell last night with lower lip injury.  On arrival to the ER her Alcohol level was 431.  Patient was diagnosed with Depression by her PMD and has a strong family hx of Depression( mom and brother).  Patient was placed on Zoloft, Lexapro and another type of antidepressant but refused to take any of them because she did not like falling asleep after taking them.  Patient denies SI/HI/AVH.  Patient denies previous suicide attempt and would prefer outpatient rehabilitation treatment.   She denies Alcohol withdrawal seizures in the past.  She is alert and oriented  X4.  Patient is worried about her 66 year old son whose father is filling for full custody for.  She is also aware that she could lose her son if she does not stop drinking Alcohol.  We discussed the damage to her liver from Alcohol and need to seek treatment.  She declined inpatient detox treatment.  She is now discharged home.  Past Psychiatric History:  Major depression, Alcoholism, Opioid use disorder in Remission  Risk to Self: Suicidal Ideation: No Suicidal Intent: No Is patient at risk for suicide?: No Suicidal Plan?: No Access  to Means: No What has been your use of drugs/alcohol within the last 12 months?: Alcohol How many times?: 0 Other Self Harm Risks: Denies Triggers for Past Attempts: None known Intentional Self Injurious Behavior: None Risk to Others: Homicidal Ideation: No Thoughts of Harm to Others: No Current Homicidal Intent:  No Current Homicidal Plan: No Access to Homicidal Means: No Identified Victim: Denies History of harm to others?: No Assessment of Violence: None Noted Violent Behavior Description: Denies Does patient have access to weapons?: No Criminal Charges Pending?: No Does patient have a court date: No Prior Inpatient Therapy: Prior Inpatient Therapy: No Prior Therapy Dates: N/A Prior Therapy Facilty/Provider(s): N/A Reason for Treatment: N/A Prior Outpatient Therapy: Prior Outpatient Therapy: Yes Prior Therapy Dates: 2016 Prior Therapy Facilty/Provider(s): Mary Garrison Reason for Treatment: Depression Does patient have an ACCT team?: No Does patient have Intensive In-House Services?  : No Does patient have [facility] services? : No Does patient have P4CC services?: Unknown  Past Medical History:  Past Medical History  Diagnosis Date  . Hepatitis C 11/2012    she suspects this was from a tattoo as she was dxd with Hep C before starting her IVDA.   Marland Kitchen Asthma     as a child  . Hypophosphatemia 06/29/2014    Phos of 1.4 > 4.0 with supplementation    . Hypokalemia 06/26/2014  . Alcohol addiction ([place])   . Polysubstance dependence ([place])     including IV heroin. inpt rehab 01/2012.   . C. difficile diarrhea 08/24/2014  . Tachycardia   . Hemorrhoids   . Mallory-Weiss tear   . Common migraine with intractable migraine 05/03/2015    Past Surgical History  Procedure Laterality Date  . Esophagogastroduodenoscopy N/A 04/07/2015    Procedure: ESOPHAGOGASTRODUODENOSCOPY (EGD);  Surgeon: Inda Castle, MD;  Location: [facility];  Service: Gastroenterology;  Laterality: N/A;  . Esophagus surgery     Family History:  Family History  Problem Relation Age of Onset  . Asthma Mother   . Migraines Mother   . Depression Brother   . Other Brother   . Migraines Brother   . Depression Sister   . Other Sister   . Migraines Sister   . Liver cancer Maternal Grandfather   . Pancreatic cancer Maternal Grandfather    . Lung cancer Maternal Grandfather   . Heart disease Maternal Grandmother   . Migraines Maternal Grandmother   . Heart disease Father   . Migraines Brother    Family Psychiatric  History: Depression Social History:  History  Alcohol Use No    Comment: heavy drinking at times-- last drink last night-- no longer drinking every day     History  Drug Use No    Comment: none in 2.5 years   was opiates    Social History   Social History  . Marital Status: Single    Spouse Name: N/A  . Number of Children: 1  . Years of Education: HS   Occupational History  . mother    Social History Main Topics  . Smoking status: Current Every Day Smoker -- 0.50 packs/day for 10 years    Types: Cigarettes  . Smokeless tobacco: Never Used  . Alcohol Use: No     Comment: heavy drinking at times-- last drink last night-- no longer drinking every day  . Drug Use: No     Comment: none in 2.5 years   was opiates  . Sexual Activity: Not Currently    Birth Control/  Protection: None   Other Topics Concern  . None   Social History Narrative   Moved from [place] to [place], [place] in 02/2014.    Lives with grandmother, mother, daughter, sister, brother, mom's boyfriend.       Patient drinks 1 cup of caffeine daily.   Patient is right handed.    Additional Social History:    Pain Medications: See PTA Prescriptions: See PTA Over the Counter: See PTA History of alcohol / drug use?: Yes Name of Substance 1: Alcohol 1 - Age of First Use: 14 1 - Amount (size/oz): 711m 1 - Frequency: daily 1 - Duration: ongoing 1 - Last Use / Amount: 05/19/2015                   Allergies:   Allergies  Allergen Reactions  . Penicillins     unknown  . Sulfa Antibiotics     unknown    Labs:  Results for orders placed or performed during the hospital encounter of 05/20/15 (from the past 48 hour(s))  CBC with Differential/Platelet     Status: Abnormal   Collection Time: 05/20/15  1:19 AM  Result  Value Ref Range   WBC 4.8 4.0 - 10.5 K/uL   RBC 2.78 (L) 3.87 - 5.11 MIL/uL   Hemoglobin 8.1 (L) 12.0 - 15.0 g/dL   HCT 24.4 (L) 36.0 - 46.0 %   MCV 87.8 78.0 - 100.0 fL   MCH 29.1 26.0 - 34.0 pg   MCHC 33.2 30.0 - 36.0 g/dL   RDW 18.2 (H) 11.5 - 15.5 %   Platelets 56 (L) 150 - 400 K/uL    Comment: REPEATED TO VERIFY SPECIMEN CHECKED FOR CLOTS PLATELET COUNT CONFIRMED BY SMEAR    Neutrophils Relative % 73 %   Lymphocytes Relative 22 %   Monocytes Relative 5 %   Eosinophils Relative 0 %   Basophils Relative 0 %   Neutro Abs 3.5 1.7 - 7.7 K/uL   Lymphs Abs 1.1 0.7 - 4.0 K/uL   Monocytes Absolute 0.2 0.1 - 1.0 K/uL   Eosinophils Absolute 0.0 0.0 - 0.7 K/uL   Basophils Absolute 0.0 0.0 - 0.1 K/uL   Smear Review PLATELET COUNT CONFIRMED BY SMEAR   Comprehensive metabolic panel     Status: Abnormal   Collection Time: 05/20/15  1:19 AM  Result Value Ref Range   Sodium 137 135 - 145 mmol/L   Potassium 2.9 (L) 3.5 - 5.1 mmol/L   Chloride 101 101 - 111 mmol/L   CO2 25 22 - 32 mmol/L   Glucose, Bld 108 (H) 65 - 99 mg/dL   BUN 6 6 - 20 mg/dL   Creatinine, Ser 0.35 (L) 0.44 - 1.00 mg/dL   Calcium 8.6 (L) 8.9 - 10.3 mg/dL   Total Protein 8.4 (H) 6.5 - 8.1 g/dL   Albumin 3.9 3.5 - 5.0 g/dL   AST 97 (H) 15 - 41 U/L   ALT 32 14 - 54 U/L   Alkaline Phosphatase 131 (H) 38 - 126 U/L   Total Bilirubin 4.5 (H) 0.3 - 1.2 mg/dL   GFR calc non Af Amer >60 >60 mL/min   GFR calc Af Amer >60 >60 mL/min    Comment: (NOTE) The eGFR has been calculated using the CKD EPI equation. This calculation has not been validated in all clinical situations. eGFR's persistently <60 mL/min signify possible Chronic Kidney Disease.    Anion gap 11 5 - 15  Ethanol  Status: Abnormal   Collection Time: 05/20/15  1:19 AM  Result Value Ref Range   Alcohol, Ethyl (B) 431 (HH) <5 mg/dL    Comment:        LOWEST DETECTABLE LIMIT FOR SERUM ALCOHOL IS 5 mg/dL FOR MEDICAL PURPOSES ONLY CRITICAL RESULT CALLED  TO, READ BACK BY AND VERIFIED WITH: T.NELSON RN AT 0253 ON 10.2.16 BY W.BUCHHOLZ.   Protime-INR     Status: Abnormal   Collection Time: 05/20/15  1:19 AM  Result Value Ref Range   Prothrombin Time 18.7 (H) 11.6 - 15.2 seconds   INR 1.55 (H) 0.00 - 1.49  APTT     Status: None   Collection Time: 05/20/15  1:19 AM  Result Value Ref Range   aPTT 32 24 - 37 seconds  I-Stat beta hCG blood, ED ([facility], [facility], [facility] only)     Status: None   Collection Time: 05/20/15  1:32 AM  Result Value Ref Range   I-stat hCG, quantitative <5.0 <5 mIU/mL   Comment 3            Comment:   GEST. AGE      CONC.  (mIU/mL)   <=1 WEEK        5 - 50     2 WEEKS       50 - 500     3 WEEKS       100 - 10,000     4 WEEKS     1,000 - 30,000        FEMALE AND NON-PREGNANT FEMALE:     LESS THAN 5 mIU/mL   Urinalysis, Routine w reflex microscopic (not at [facility])     Status: Abnormal   Collection Time: 05/20/15  6:36 AM  Result Value Ref Range   Color, Urine ORANGE (A) YELLOW    Comment: BIOCHEMICALS MAY BE AFFECTED BY COLOR   APPearance TURBID (A) CLEAR   Specific Gravity, Urine 1.020 1.005 - 1.030   pH 6.0 5.0 - 8.0   Glucose, UA NEGATIVE NEGATIVE mg/dL   Hgb urine dipstick SMALL (A) NEGATIVE   Bilirubin Urine MODERATE (A) NEGATIVE   Ketones, ur NEGATIVE NEGATIVE mg/dL   Protein, ur 100 (A) NEGATIVE mg/dL   Urobilinogen, UA 2.0 (H) 0.0 - 1.0 mg/dL   Nitrite POSITIVE (A) NEGATIVE   Leukocytes, UA SMALL (A) NEGATIVE  Urine microscopic-add on     Status: Abnormal   Collection Time: 05/20/15  6:36 AM  Result Value Ref Range   Squamous Epithelial / LPF FEW (A) RARE   WBC, UA 3-6 <3 WBC/hpf   RBC / HPF 3-6 <3 RBC/hpf   Bacteria, UA MANY (A) RARE   Urine-Other MUCOUS PRESENT   Urine rapid drug screen (hosp performed)     Status: None   Collection Time: 05/20/15  6:36 AM  Result Value Ref Range   Opiates NONE DETECTED NONE DETECTED   Cocaine NONE DETECTED NONE DETECTED   Benzodiazepines NONE DETECTED NONE DETECTED    Amphetamines NONE DETECTED NONE DETECTED   Tetrahydrocannabinol NONE DETECTED NONE DETECTED   Barbiturates NONE DETECTED NONE DETECTED    Comment:        DRUG SCREEN FOR MEDICAL PURPOSES ONLY.  IF CONFIRMATION IS NEEDED FOR ANY PURPOSE, NOTIFY LAB WITHIN 5 DAYS.        LOWEST DETECTABLE LIMITS FOR URINE DRUG SCREEN Drug Class       Cutoff (ng/mL) Amphetamine      1000 Barbiturate  200 Benzodiazepine   604 Tricyclics       540 Opiates          300 Cocaine          300 THC              50   Ethanol     Status: Abnormal   Collection Time: 05/20/15  7:50 AM  Result Value Ref Range   Alcohol, Ethyl (B) 284 (H) <5 mg/dL    Comment:        LOWEST DETECTABLE LIMIT FOR SERUM ALCOHOL IS 5 mg/dL FOR MEDICAL PURPOSES ONLY     Current Facility-Administered Medications  Medication Dose Route Frequency Provider Last Rate Last Dose  . LORazepam (ATIVAN) tablet 0-4 mg  0-4 mg Oral 4 times per day Lacretia Leigh, MD   0 mg at 05/20/15 9811   Followed by  . [START ON 05/22/2015] LORazepam (ATIVAN) tablet 0-4 mg  0-4 mg Oral Q12H Lacretia Leigh, MD       Current Outpatient Prescriptions  Medication Sig Dispense Refill  . albuterol (PROVENTIL HFA;VENTOLIN HFA) 108 (90 BASE) MCG/ACT inhaler Inhale 2 puffs into the lungs every 6 (six) hours as needed. For shortness of breath 8 g 5  . calcium-vitamin D (OSCAL WITH D) 500-200 MG-UNIT per tablet Take 1 tablet by mouth 2 (two) times daily. 180 tablet 1  . cholecalciferol (VITAMIN D) 1000 UNITS tablet Take 1 tablet (1,000 Units total) by mouth daily. 90 tablet 3  . Cyanocobalamin (B-12 PO) Take 1 tablet by mouth daily.    . Diclofenac Sodium 3 % GEL Place 1 application onto the skin as needed (for muscle pain). 914 g 0  . folic acid (FOLVITE) 1 MG tablet Take 1 tablet (1 mg total) by mouth daily. 90 tablet 3  . ondansetron (ZOFRAN ODT) 8 MG disintegrating tablet Take 1 tablet (8 mg total) by mouth every 8 (eight) hours as needed for nausea or  vomiting. 30 tablet 2  . pantoprazole (PROTONIX) 40 MG tablet Take 1 tablet (40 mg total) by mouth daily. 30 tablet 0  . potassium chloride SA (K-DUR,KLOR-CON) 20 MEQ tablet Take 1 tablet (20 mEq total) by mouth daily. 30 tablet 0  . Pyridoxine HCl (B-6 PO) Take 1 tablet by mouth daily.    . rizatriptan (MAXALT-MLT) 10 MG disintegrating tablet Take 1 tablet (10 mg total) by mouth 3 (three) times daily as needed for migraine. 12 tablet 3  . Thiamine HCl (B-1 PO) Take 1 tablet by mouth daily.    Marland Kitchen topiramate (TOPAMAX) 25 MG tablet Take one tablet at night for one week, then take 2 tablets at night (Patient taking differently: Take 50 mg by mouth at bedtime. Take one tablet at night for one week, then take 2 tablets at night) 60 tablet 3  . traMADol (ULTRAM) 50 MG tablet Take 1 tablet (50 mg total) by mouth every 12 (twelve) hours as needed for moderate pain or severe pain. 60 tablet 1  . traZODone (DESYREL) 50 MG tablet Take 0.5-1 tablets (25-50 mg total) by mouth at bedtime as needed for sleep. 30 tablet 3    Musculoskeletal: Strength & Muscle Tone: within normal limits Gait & Station: normal Patient leans: N/A  Psychiatric Specialty Exam: Review of Systems  Constitutional: Negative.   HENT: Negative.   Eyes: Negative.   Respiratory: Negative.   Cardiovascular: Negative.   Gastrointestinal:       Hx of Hep C, Liver Cirrhosis  Genitourinary: Negative.  Musculoskeletal: Negative.   Skin: Negative.   Neurological: Negative.   Endo/Heme/Allergies: Negative.     Blood pressure 112/70, pulse 100, temperature 98.1 F (36.7 C), temperature source Oral, resp. rate 18, last menstrual period 11/28/2014, SpO2 98 %.There is no weight on file to calculate BMI.  General Appearance: Casual  Eye Contact::  Good  Speech:  Clear and Coherent and Normal Rate  Volume:  Normal  Mood:  Depressed  Affect:  Congruent and Depressed  Thought Process:  Coherent, Goal Directed and Intact  Orientation:   Full (Time, Place, and Person)  Thought Content:  WDL  Suicidal Thoughts:  No  Homicidal Thoughts:  No  Memory:  Immediate;   Good Recent;   Good Remote;   Good  Judgement:  Fair  Insight:  Fair  Psychomotor Activity:  Normal  Concentration:  Fair  Recall:  NA  Fund of Knowledge:Fair  Language: Good  Akathisia:  NA  Handed:  Right  AIMS (if indicated):     Assets:  Desire for Improvement  ADL's:  Intact  Cognition: WNL     Disposition:   Discharge home, follow up with outpatient rehabilitation and [place] facilities as requested,  Written information provided   Delfin Gant   PMHNP-BC 05/20/2015 11:28 AM   Patient seen and I agree with treatment and plan  Levonne Spiller M.D.

## 2015-05-20 NOTE — ED Notes (Signed)
Bed: [facility] Expected date:  Expected time:  Means of arrival:  Comments: EMS ETOH detox 

## 2015-05-20 NOTE — [facility] Counselor (Signed)
Psychiatry has cleared patient at this time. Patient will be discharged with outpatient substance abuse resources. Counselor reviewed substance abuse facilities with patient and [facility]. Patient states that she will contact one of the resources to follow up. Patient states that she does not have any questions or concerns at this time. Patient states that she has transportation and she will call for transportation once she is discharged.   Davina Poke, LCSW Therapeutic Triage Specialist [facility] 05/20/2015 11:12 AM
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
## 2015-05-20 NOTE — Discharge Instructions (Signed)
Alcohol Use Disorder Alcohol use disorder is a mental disorder. It is not a one-time incident of heavy drinking. Alcohol use disorder is the excessive and uncontrollable use of alcohol over time that leads to problems with functioning in one or more areas of daily living. People with this disorder risk harming themselves and others when they drink to excess. Alcohol use disorder also can cause other mental disorders, such as mood and anxiety disorders, and serious physical problems. People with alcohol use disorder often misuse other drugs.  Alcohol use disorder is common and widespread. Some people with this disorder drink alcohol to cope with or escape from negative life events. Others drink to relieve chronic pain or symptoms of mental illness. People with a family history of alcohol use disorder are at higher risk of losing control and using alcohol to excess.  SYMPTOMS  Signs and symptoms of alcohol use disorder may include the following:   Consumption ofalcohol inlarger amounts or over a longer period of time than intended.  Multiple unsuccessful attempts to cutdown or control alcohol use.   A great deal of time spent obtaining alcohol, using alcohol, or recovering from the effects of alcohol (hangover).  A strong desire or urge to use alcohol (cravings).   Continued use of alcohol despite problems at work, school, or home because of alcohol use.   Continued use of alcohol despite problems in relationships because of alcohol use.  Continued use of alcohol in situations when it is physically hazardous, such as driving a car.  Continued use of alcohol despite awareness of a physical or psychological problem that is likely related to alcohol use. Physical problems related to alcohol use can involve the brain, heart, liver, stomach, and intestines. Psychological problems related to alcohol use include intoxication, depression, anxiety, psychosis, delirium, and dementia.   The need for  increased amounts of alcohol to achieve the same desired effect, or a decreased effect from the consumption of the same amount of alcohol (tolerance).  Withdrawal symptoms upon reducing or stopping alcohol use, or alcohol use to reduce or avoid withdrawal symptoms. Withdrawal symptoms include:  Racing heart.  Hand tremor.  Difficulty sleeping.  Nausea.  Vomiting.  Hallucinations.  Restlessness.  Seizures. DIAGNOSIS Alcohol use disorder is diagnosed through an assessment by your health care provider. Your health care provider may start by asking three or four questions to screen for excessive or problematic alcohol use. To confirm a diagnosis of alcohol use disorder, at least two symptoms must be present within a 12-month period. The severity of alcohol use disorder depends on the number of symptoms:  Mild--two or three.  Moderate--four or five.  Severe--six or more. Your health care provider may perform a physical exam or use results from lab tests to see if you have physical problems resulting from alcohol use. Your health care provider may refer you to a mental health professional for evaluation. TREATMENT  Some people with alcohol use disorder are able to reduce their alcohol use to low-risk levels. Some people with alcohol use disorder need to quit drinking alcohol. When necessary, mental health professionals with specialized training in substance use treatment can help. Your health care provider can help you decide how severe your alcohol use disorder is and what type of treatment you need. The following forms of treatment are available:   Detoxification. Detoxification involves the use of prescription medicines to prevent alcohol withdrawal symptoms in the first week after quitting. This is important for people with a history of symptoms   of withdrawal and for heavy drinkers who are likely to have withdrawal symptoms. Alcohol withdrawal can be dangerous and, in severe cases, cause  death. Detoxification is usually provided in a hospital or in-patient substance use treatment facility.  Counseling or talk therapy. Talk therapy is provided by substance use treatment counselors. It addresses the reasons people use alcohol and ways to keep them from drinking again. The goals of talk therapy are to help people with alcohol use disorder find healthy activities and ways to cope with life stress, to identify and avoid triggers for alcohol use, and to handle cravings, which can cause relapse.  Medicines.Different medicines can help treat alcohol use disorder through the following actions:  Decrease alcohol cravings.  Decrease the positive reward response felt from alcohol use.  Produce an uncomfortable physical reaction when alcohol is used (aversion therapy).  Support groups. Support groups are run by people who have quit drinking. They provide emotional support, advice, and guidance. These forms of treatment are often combined. Some people with alcohol use disorder benefit from intensive combination treatment provided by specialized substance use treatment centers. Both inpatient and outpatient treatment programs are available. Document Released: 09/11/2004 Document Revised: 12/19/2013 Document Reviewed: 11/11/2012 [employer] Patient Information 2015 [employer]. This information is not intended to replace advice given to you by your health care provider. Make sure you discuss any questions you have with your health care provider.  
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
## 2015-05-20 NOTE — ED Notes (Signed)
Mary Garrison called this RN and notified that after talking to the pt and d.c her, that her urine came back positive for UTI. Dr Lynelle Doctor notified and rx'd abx. VM was left for pt on mobile number that a rx will be left at registration window in ED for her to pickup. Urine culture was also sent

## 2015-05-20 NOTE — Progress Notes (Signed)
11:00am. Pt's IVC rescinded by Dr. Tenny Craw. CSW filed paperwork and faxed to [employer]  York Spaniel [facility] Clinical Social Worker [facility] Emergency Department phone: [phone]
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
## 2015-05-20 NOTE — BHH Suicide Risk Assessment (Signed)
Suicide Risk Assessment  Discharge Assessment   [facility] Discharge Suicide Risk Assessment   Demographic Factors:  Adolescent or young adult, Caucasian, Low socioeconomic status and Unemployed  Total Time spent with patient: 20 minutes  Musculoskeletal: Strength & Muscle Tone: within normal limits Gait & Station: normal Patient leans: N/A  Psychiatric Specialty Exam:     Blood pressure 115/55, pulse 91, temperature 98.3 F (36.8 C), temperature source Oral, resp. rate 18, last menstrual period 11/28/2014, SpO2 100 %.There is no weight on file to calculate BMI.  General Appearance: Casual  Eye Contact::  Good  Speech:  Clear and Coherent and Normal Rate  Volume:  Normal  Mood:  Depressed  Affect:  Congruent and Depressed  Thought Process:  Coherent, Goal Directed and Intact  Orientation:  Full (Time, Place, and Person)  Thought Content:  WDL  Suicidal Thoughts:  No  Homicidal Thoughts:  No  Memory:  Immediate;   Good Recent;   Good Remote;   Good  Judgement:  Fair  Insight:  Fair  Psychomotor Activity:  Normal  Concentration:  Fair  Recall:  NA  Fund of Knowledge:Fair  Language: Good  Akathisia:  NA  Handed:  Right  AIMS (if indicated):     Assets:  Desire for Improvement  ADL's:  Intact  Cognition: WNL        Has this patient used any form of tobacco in the last 30 days? (Cigarettes, Smokeless Tobacco, Cigars, and/or Pipes) N/A  Mental Status Per Nursing Assessment::   On Admission:     Current Mental Status by Physician: NA  Loss Factors: NA  Historical Factors: NA  Risk Reduction Factors:   Responsible for children under 71 years of age, Religious beliefs about death, Living with another person, especially a relative and Positive coping skills or problem solving skills  Continued Clinical Symptoms:  Depression:   Insomnia Alcohol/Substance Abuse/Dependencies  Cognitive Features That Contribute To Risk:  Polarized thinking    Suicide Risk:   Minimal: No identifiable suicidal ideation.  Patients presenting with no risk factors but with morbid ruminations; may be classified as minimal risk based on the severity of the depressive symptoms  Principal Problem: Alcohol use disorder, severe, dependence [facility]) Discharge Diagnoses:  Patient Active Problem List   Diagnosis Date Noted  . Alcohol use disorder, severe, dependence (HCC) [F10.20] 05/20/2015    Priority: High  . Common migraine with intractable migraine [G43.019] 05/03/2015  . Insomnia [G47.00] 04/17/2015  . Chronic headache [R51] 04/17/2015  . Alcoholic hepatitis without ascites [K70.10]   . Acute blood loss anemia [D62]   . GI bleed [K92.2] 04/07/2015  . Hypokalemia [E87.6] 04/07/2015  . Mallory-Weiss tear [K22.6] 04/07/2015  . Alcoholic cirrhosis of liver without ascites (HCC) [K70.30]   . Hepatic cirrhosis (HCC) [K74.60] 03/20/2015  . Cough [R05] 03/06/2015  . Headache [R51] 03/06/2015  . Dysuria [R30.0] 03/06/2015  . Malnutrition of moderate degree (HCC) [E44.0] 02/17/2015  . Acute alcoholic hepatitis [K70.10] 02/17/2015  . Acute renal failure syndrome (HCC) [N17.9]   . Ascites [R18.8] 02/13/2015  . Muscle pain [M79.1] 12/27/2014  . Chronic anemia [D64.9] 12/18/2014  . Hypokalemia, inadequate intake [E87.6] 12/18/2014  . Well woman exam [Z00.00] 11/03/2014  . Tachycardia [R00.0] 10/09/2014  . GERD (gastroesophageal reflux disease) [K21.9] 10/09/2014  . Alcohol abuse [F10.10] 08/20/2014  . Tobacco abuse [Z72.0] 08/20/2014  . Vitamin D insufficiency [E55.9] 07/26/2014  . Anemia [D64.9] 06/26/2014  . Loss of weight [R63.4] 06/21/2014  . Depression with anxiety [F41.8]  06/21/2014  . Asthma [J45.909]   . Chronic hepatitis C without hepatic coma (HCC) [B18.2] 11/16/2013    Follow-up Information    Follow up with Lora Paula, MD In 1 week.   Specialty:  Family Medicine   Contact information:   [address] [place] [address] [phone]        Plan Of Care/Follow-up recommendations:  Activity:  As tolerated Diet:  regular  Is patient on multiple antipsychotic therapies at discharge:  No   Has Patient had three or more failed trials of antipsychotic monotherapy by history:  No  Recommended Plan for Multiple Antipsychotic Therapies: NA    Earney Navy   PMHNP-BC 05/20/2015, 12:36 PM   Patient seen and I agree with treatment and plan  Jamse Belfast.D.

## 2015-05-20 NOTE — ED Provider Notes (Signed)
CSN: [id number]     Arrival date & time 05/20/15  0020 History  By signing my name below, I, Bethel Born, attest that this documentation has been prepared under the direction and in the presence of Lorre Nick, MD. Electronically Signed: Bethel Born, ED Scribe. 05/20/2015. 1:32 AM  Chief Complaint  Patient presents with  . Lip Laceration  . Alcohol Intoxication   The history is provided by the patient. No language interpreter was used.   Brought in by [employer] with IVC paperwork, Mary Garrison is a 24 y.o. female with PMHx of alcohol abuse, polysubstance abuse, and hepatitis C who presents to the Emergency Department with chief complaint pf lower lip laceration with onset tonight.  Pt states that tonight she tripped and fell because she was drinking. Per [employer], the fall was witnessed by relatives. [employer] got involved when her family took out IVC paperwork because she has not been eating and has been drinking heavily.  Pt denies SI, LOC, headache, neck pain, and any other pain aside from that at her lip. She reports drinking 1/2 bottle of [facility]) per day because she "worries too much". LNMP was in April and she is concerned for pregnancy. Pt denies illicit drug use. Past Medical History  Diagnosis Date  . Hepatitis C 11/2012    she suspects this was from a tattoo as she was dxd with Hep C before starting her IVDA.   Marland Kitchen Asthma     as a child  . Hypophosphatemia 06/29/2014    Phos of 1.4 > 4.0 with supplementation    . Hypokalemia 06/26/2014  . Alcohol addiction (HCC)   . Polysubstance dependence (HCC)     including IV heroin. inpt rehab 01/2012.   . C. difficile diarrhea 08/24/2014  . Tachycardia   . Hemorrhoids   . Mallory-Weiss tear   . Common migraine with intractable migraine 05/03/2015   Past Surgical History  Procedure Laterality Date  . Esophagogastroduodenoscopy N/A 04/07/2015    Procedure: ESOPHAGOGASTRODUODENOSCOPY (EGD);  Surgeon: Louis Meckel, MD;  Location: [facility];   Service: Gastroenterology;  Laterality: N/A;  . Esophagus surgery     Family History  Problem Relation Age of Onset  . Asthma Mother   . Migraines Mother   . Depression Brother   . Other Brother   . Migraines Brother   . Depression Sister   . Other Sister   . Migraines Sister   . Liver cancer Maternal Grandfather   . Pancreatic cancer Maternal Grandfather   . Lung cancer Maternal Grandfather   . Heart disease Maternal Grandmother   . Migraines Maternal Grandmother   . Heart disease Father   . Migraines Brother    Social History  Substance Use Topics  . Smoking status: Current Every Day Smoker -- 0.50 packs/day for 10 years    Types: Cigarettes  . Smokeless tobacco: Never Used  . Alcohol Use: No     Comment: heavy drinking at times-- last drink last night-- no longer drinking every day   OB History    Gravida Para Term Preterm AB TAB SAB Ectopic Multiple Living   1              Review of Systems  HENT:       Lower lip laceration  Musculoskeletal: Negative for myalgias, back pain, arthralgias and neck pain.  Neurological: Negative for syncope and headaches.  Psychiatric/Behavioral: Negative for suicidal ideas.  All other systems reviewed and are negative.  Allergies  Penicillins  and Sulfa antibiotics  Home Medications   Prior to Admission medications   Medication Sig Start Date End Date Taking? Authorizing Provider  albuterol (PROVENTIL HFA;VENTOLIN HFA) 108 (90 BASE) MCG/ACT inhaler Inhale 2 puffs into the lungs every 6 (six) hours as needed. For shortness of breath 03/06/15   Dessa Phi, MD  calcium-vitamin D (OSCAL WITH D) 500-200 MG-UNIT per tablet Take 1 tablet by mouth 2 (two) times daily. 07/26/14   Dessa Phi, MD  cholecalciferol (VITAMIN D) 1000 UNITS tablet Take 1 tablet (1,000 Units total) by mouth daily. 07/26/14   Josalyn Funches, MD  Cyanocobalamin (B-12 PO) Take 1 tablet by mouth daily.    Historical Provider, MD  Diclofenac Sodium 3 % GEL  Place 1 application onto the skin as needed (for muscle pain). 12/27/14   Dessa Phi, MD  folic acid (FOLVITE) 1 MG tablet Take 1 tablet (1 mg total) by mouth daily. 06/28/14   Josalyn Funches, MD  ondansetron (ZOFRAN ODT) 8 MG disintegrating tablet Take 1 tablet (8 mg total) by mouth every 8 (eight) hours as needed for nausea or vomiting. 03/06/15   Dessa Phi, MD  pantoprazole (PROTONIX) 40 MG tablet Take 1 tablet (40 mg total) by mouth daily. 04/09/15   Albertine Grates, MD  potassium chloride SA (K-DUR,KLOR-CON) 20 MEQ tablet Take 1 tablet (20 mEq total) by mouth daily. 03/07/15   Josalyn Funches, MD  Pyridoxine HCl (B-6 PO) Take 1 tablet by mouth daily.    Historical Provider, MD  rizatriptan (MAXALT-MLT) 10 MG disintegrating tablet Take 1 tablet (10 mg total) by mouth 3 (three) times daily as needed for migraine. 05/03/15   York Spaniel, MD  Thiamine HCl (B-1 PO) Take 1 tablet by mouth daily.    Historical Provider, MD  topiramate (TOPAMAX) 25 MG tablet Take one tablet at night for one week, then take 2 tablets at night 05/03/15   York Spaniel, MD  traMADol (ULTRAM) 50 MG tablet Take 1 tablet (50 mg total) by mouth every 12 (twelve) hours as needed for moderate pain or severe pain. 04/17/15   Dessa Phi, MD  traZODone (DESYREL) 50 MG tablet Take 0.5-1 tablets (25-50 mg total) by mouth at bedtime as needed for sleep. 04/17/15   Josalyn Funches, MD   BP 117/74 mmHg  Pulse 109  Temp(Src) 98.1 F (36.7 C) (Oral)  Resp 15  SpO2 100%  LMP 11/28/2014 (Approximate) Physical Exam  Constitutional: She is oriented to person, place, and time. She appears well-developed and well-nourished.  Non-toxic appearance. No distress.  HENT:  Head: Normocephalic.  Bleeding superficial abrasion noted to lower lip  Eyes: Conjunctivae, EOM and lids are normal. Pupils are equal, round, and reactive to light. Scleral icterus is present.  Neck: Normal range of motion. Neck supple. No tracheal deviation  present. No thyroid mass present.  Cardiovascular: Normal rate, regular rhythm and normal heart sounds.  Exam reveals no gallop.   No murmur heard. Pulmonary/Chest: Effort normal and breath sounds normal. No stridor. No respiratory distress. She has no decreased breath sounds. She has no wheezes. She has no rhonchi. She has no rales.  Abdominal: Soft. Normal appearance and bowel sounds are normal. She exhibits no distension. There is no tenderness. There is no rebound and no CVA tenderness.  No ascites  Musculoskeletal: Normal range of motion. She exhibits no edema or tenderness.  Neurological: She is alert and oriented to person, place, and time. She has normal strength. No cranial nerve deficit or sensory deficit.  GCS eye subscore is 4. GCS verbal subscore is 5. GCS motor subscore is 6.  Skin: Skin is warm and dry. No abrasion and no rash noted.  Jaundiced  Psychiatric: She has a normal mood and affect. Her speech is normal and behavior is normal.  Nursing note and vitals reviewed.   ED Course  Procedures (including critical care time)  DIAGNOSTIC STUDIES: Oxygen Saturation is 100% on RA,  normal by my interpretation.    COORDINATION OF CARE: 1:04 AM Discussed treatment plan which includes lab work, CT head, CT maxillofacial, and IVF with pt at bedside and pt agreed to plan.  Labs Review Labs Reviewed  CBC WITH DIFFERENTIAL/PLATELET  COMPREHENSIVE METABOLIC PANEL  ETHANOL  PROTIME-INR  APTT  URINALYSIS, ROUTINE W REFLEX MICROSCOPIC (NOT AT [facility])  I-STAT BETA HCG BLOOD, ED ([facility], [facility], [facility] ONLY)    Imaging Review No results found. I have personally reviewed and evaluated these images and lab results as part of my medical decision-making.   EKG Interpretation None      MDM   Final diagnoses:  None    I personally performed the services described in this documentation, which was scribed in my presence. The recorded information has been reviewed and is  accurate.    Patient has been medically cleared and will be seen by the behavior health service  Lorre Nick, MD 05/22/15 2340

## 2015-05-20 NOTE — ED Notes (Signed)
Notified Charge nurse patient SI needs a Comptroller.

## 2015-05-21 LAB — URINE CULTURE

## 2015-05-29 ENCOUNTER — Ambulatory Visit: Payer: Medicaid Other | Admitting: *Deleted

## 2015-06-11 ENCOUNTER — Emergency Department: Payer: Medicaid - Out of State

## 2015-06-11 ENCOUNTER — Emergency Department
Admission: EM | Admit: 2015-06-11 | Discharge: 2015-06-11 | Disposition: A | Discharge status: Home / Self Care | Payer: Medicaid - Out of State | Attending: Emergency Medicine | Admitting: Emergency Medicine

## 2015-06-11 DIAGNOSIS — R17 Unspecified jaundice: Secondary | ICD-10-CM | POA: Insufficient documentation

## 2015-06-11 DIAGNOSIS — B182 Chronic viral hepatitis C: Secondary | ICD-10-CM | POA: Insufficient documentation

## 2015-06-11 LAB — CBC AND DIFFERENTIAL
Basophils %: 1.5 % (ref 0.0–3.0)
Basophils Absolute: 0.1 10*3/uL (ref 0.0–0.3)
Eosinophils %: 1.1 % (ref 0.0–7.0)
Eosinophils Absolute: 0.1 10*3/uL (ref 0.0–0.8)
Hematocrit: 28.2 % — ABNORMAL LOW (ref 36.0–48.0)
Hemoglobin: 9.1 gm/dL — ABNORMAL LOW (ref 12.0–16.0)
Lymphocytes Absolute: 1.1 10*3/uL (ref 0.6–5.1)
Lymphocytes: 21.3 % (ref 15.0–46.0)
MCH: 29 pg (ref 28–35)
MCHC: 32 gm/dL (ref 32–36)
MCV: 91 fL (ref 80–100)
MPV: 8.9 fL (ref 6.0–10.0)
Monocytes Absolute: 0.3 10*3/uL (ref 0.1–1.7)
Monocytes: 5.8 % (ref 3.0–15.0)
Neutrophils %: 70.3 % (ref 42.0–78.0)
Neutrophils Absolute: 3.5 10*3/uL (ref 1.7–8.6)
PLT CT: 195 10*3/uL (ref 130–440)
RBC: 3.11 10*6/uL — ABNORMAL LOW (ref 3.80–5.00)
RDW: 20.4 % — ABNORMAL HIGH (ref 11.0–14.0)
WBC: 5 10*3/uL (ref 4.0–11.0)

## 2015-06-11 LAB — COMPREHENSIVE METABOLIC PANEL
ALT: 32 U/L (ref 0–55)
AST (SGOT): 64 U/L — ABNORMAL HIGH (ref 10–42)
Albumin/Globulin Ratio: 0.85 Ratio (ref 0.70–1.50)
Albumin: 4.1 gm/dL (ref 3.5–5.0)
Alkaline Phosphatase: 131 U/L (ref 40–145)
Anion Gap: 16.3 mMol/L (ref 7.0–18.0)
BUN / Creatinine Ratio: 10 Ratio (ref 10.0–30.0)
BUN: 7 mg/dL (ref 7–22)
Bilirubin, Total: 4.5 mg/dL — ABNORMAL HIGH (ref 0.1–1.2)
CO2: 20.5 mMol/L (ref 20.0–30.0)
Calcium: 10.3 mg/dL (ref 8.5–10.5)
Chloride: 103 mMol/L (ref 98–110)
Creatinine: 0.7 mg/dL (ref 0.60–1.20)
EGFR: >60 mL/min/{1.73_m2}
Globulin: 4.8 gm/dL — ABNORMAL HIGH (ref 2.0–4.0)
Glucose: 97 mg/dL (ref 70–99)
Osmolality Calc: 270 mOsm/kg — ABNORMAL LOW (ref 275–300)
Potassium: 3.8 mMol/L (ref 3.5–5.3)
Protein, Total: 8.9 gm/dL — ABNORMAL HIGH (ref 6.0–8.3)
Sodium: 136 mMol/L (ref 136–147)

## 2015-06-11 LAB — VH URINALYSIS WITH MICROSCOPIC AND CULTURE IF INDICATED
Bilirubin, UA: NEGATIVE
Blood, UA: NEGATIVE
Glucose, UA: NEGATIVE mg/dL
Ketones UA: NEGATIVE mg/dL
Leukocyte Esterase, UA: NEGATIVE Leu/uL
Nitrite, UA: NEGATIVE
Protein, UR: NEGATIVE mg/dL
RBC, UA: <1 /hpf (ref 0–5)
Squam Epithel, UA: 1 /hpf (ref 0–2)
Urine Specific Gravity: 1.013 (ref 1.001–1.040)
Urobilinogen, UA: 2 mg/dL — AB
WBC, UA: <1 /hpf (ref 0–4)
pH, Urine: 5 pH (ref 5.0–8.0)

## 2015-06-11 LAB — HEPATITIS PANEL, ACUTE
Hepatitis A IgM: NONREACTIVE
Hepatitis B Core IgM: NONREACTIVE
Hepatitis B Surface Antigen: NONREACTIVE
Hepatitis C Antibody: REACTIVE — AB

## 2015-06-11 LAB — HEPATIC FUNCTION PANEL
ALT: 31 U/L (ref 0–55)
AST (SGOT): 61 U/L — ABNORMAL HIGH (ref 10–42)
Albumin/Globulin Ratio: 0.87 Ratio (ref 0.70–1.50)
Albumin: 4.1 gm/dL (ref 3.5–5.0)
Alkaline Phosphatase: 129 U/L (ref 40–145)
Bilirubin Direct: 2 mg/dL — ABNORMAL HIGH (ref 0.0–0.3)
Bilirubin, Total: 4.4 mg/dL — ABNORMAL HIGH (ref 0.1–1.2)
Globulin: 4.7 gm/dL — ABNORMAL HIGH (ref 2.0–4.0)
Protein, Total: 8.8 gm/dL — ABNORMAL HIGH (ref 6.0–8.3)

## 2015-06-11 LAB — HCG, SERUM, QUALITATIVE: BHCG Qualitative: NEGATIVE

## 2015-06-11 LAB — HIV AG/AB 4TH GENERATION: HIV Ag/Ab, 4th Generation: NONREACTIVE

## 2015-06-11 LAB — ETHANOL: Alcohol: <10 mg/dL (ref 0–9)

## 2015-06-11 MED ORDER — AZITHROMYCIN 250 MG PO TABS
500.0000 mg | ORAL_TABLET | Freq: Once | ORAL | Status: AC
Start: 2015-06-11 — End: 2015-06-11
  Administered 2015-06-11: 500 mg via ORAL

## 2015-06-11 MED ORDER — LIDOCAINE HCL 1 % IJ SOLN
250.0000 mg | Freq: Once | INTRAMUSCULAR | Status: AC
Start: 2015-06-11 — End: 2015-06-11
  Administered 2015-06-11: 250 mg via INTRAMUSCULAR

## 2015-06-11 MED ORDER — ONDANSETRON 4 MG PO TBDP
ORAL_TABLET | ORAL | Status: AC
Start: 2015-06-11 — End: ?
  Filled 2015-06-11: qty 2

## 2015-06-11 MED ORDER — NORGESTREL-ETHINYL ESTRADIOL 0.5-50 MG-MCG PO TABS
2.0000 | ORAL_TABLET | Freq: Once | ORAL | Status: DC
Start: 2015-06-11 — End: 2015-06-11
  Filled 2015-06-11: qty 2

## 2015-06-11 MED ORDER — ONDANSETRON 4 MG PO TBDP
8.0000 mg | ORAL_TABLET | Freq: Once | ORAL | Status: AC
Start: 2015-06-11 — End: 2015-06-11
  Administered 2015-06-11: 8 mg via ORAL

## 2015-06-11 MED ORDER — AZITHROMYCIN 250 MG PO TABS
1000.0000 mg | ORAL_TABLET | Freq: Once | ORAL | Status: DC
Start: 2015-06-11 — End: 2015-06-11

## 2015-06-11 MED ORDER — AZITHROMYCIN 250 MG PO TABS
ORAL_TABLET | ORAL | Status: AC
Start: 2015-06-11 — End: ?
  Filled 2015-06-11: qty 2

## 2015-06-11 MED ORDER — METRONIDAZOLE 250 MG PO TABS
2000.0000 mg | ORAL_TABLET | Freq: Once | ORAL | Status: DC
Start: 2015-06-11 — End: 2015-06-11

## 2015-06-11 MED ORDER — LIDOCAINE HCL (PF) 1 % IJ SOLN
INTRAMUSCULAR | Status: AC
Start: 2015-06-11 — End: ?
  Filled 2015-06-11: qty 10

## 2015-06-11 MED ORDER — CEFTRIAXONE SODIUM 250 MG IJ SOLR
INTRAMUSCULAR | Status: AC
Start: 2015-06-11 — End: ?
  Filled 2015-06-11: qty 250

## 2015-06-11 MED ORDER — LEVONORGESTREL 1.5 MG PO TABS
1.5000 mg | ORAL_TABLET | Freq: Once | ORAL | Status: AC
Start: 2015-06-11 — End: 2015-06-11
  Administered 2015-06-11: 1.5 mg via ORAL
  Filled 2015-06-11: qty 1
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
## 2015-06-11 NOTE — Special Discharge Instructions (Signed)
Follow up in [place] as per Betty's recommendations, do not drink, this will make your liver much worse, FU with GI doctor about continued hep c treatment.

## 2015-06-11 NOTE — ED Notes (Signed)
#  23G straight stick to let AC to obtain blood specimens, sent to lab. 3 attempts to obtain blood.

## 2015-06-11 NOTE — ED Notes (Signed)
Bed: RAU-B  Expected date:   Expected time:   Means of arrival:   Comments:  To 44

## 2015-06-11 NOTE — Discharge Instructions (Signed)
Understanding Hepatitis C (HCV)  Hepatitis means inflammation of the liver. There are many kinds of hepatitis. Some can be spread. Otherscan not. Hepatitis C virus (HCV) isspread by blood exposure. HCV can lead to lifelong liver disease in about 80% of people infected. Complications include chronic hepatitis, cirrhosis, liver failure, and liver cancer.    Symptoms of hepatitis C  Most people notice no problems after being infected with HCV, or have very vague symptoms of fatigue, loss of appetitie, nausea, vomiting, abdominal pain, or joint pain. If a person has been infected for many years, they may have severe liver damage, leading to liver failure. Symptoms of liver failure include:   Flu-like problems (fatigue, nausea, vomiting, diarrhea, and sore muscles and joints)   Tenderness in the upper right abdomen   Jaundice (yellowing skin)   Swelling in the abdomen   Itching   Dark urine   Internal bleeding   Mental confusion   Coma    How HCV spreads  HCV spreads through exposure to an infected person's blood. This is most likely to occur if:   You used an infected needle (IV drug needles, tattoos, acupuncture needles, and body piercing)   You had a needlestick injury in the hospital   You shared personal care items such as razors   You had sex without a condom with an infected person (a less common cause)   You had a blood transfusion several years ago (blood is now screened for HCV) or organ transplant prior to blood and organ testing  Many people do not know how they were exposed to HCV.  There are many new treatments for HCV, many of which are oral therapies.  It is now standard to test all people for HCV who were born between [place] who have had blood exposure, and who have abnormal liver tests.   [address] The [place], [place] [address]. All rights reserved. This information is not intended as a substitute for professional medical care. Always  follow your healthcare professional's instructions.          Crime Victim  You have been the victim of a crime. Even if you feel you made a mistake, you are not at fault. The person that committed the crime (the offender) is at fault. It is normal to feel many strong emotions, such as shock, embarrassment, fear, depression, blame, guilt, shame or anger. For a while, you may find it hard to find a sense of balance in your life. You may not be able to think clearly and you may have strong emotions about what happened to you. This is normal. The following outlines the steps you need to take to help you get through this.  Reporting The Crime   If the crime has not already been reported to the police it is important that you do this as soon as possible. When you talk to the police:   Give as much detail as possible.   Get the police officer's business card and write the case number on it. Keep this in a safe place.   Request the police notify you if they make an arrest or when the case goes to the prosecutor's or district attorney's office.   Find out if there is a Database administrator or advocate program in your community. Such a program can give you specific information about your rights, the prosecution process, how to get money for damages, and other support services.  Keep  Records    Keep a record of the crime: the date, time and place along with name(s) of any witnesses and the names of offenders.   Write down the names of the police officer(s) involved in the case, the case number, the prosecutor assigned to the case, the victim advocate, and any other people or programs that you are referred to.   In order to get money for damages, save receipts for medical treatment, keep a record of stolen/damaged property, and mileage to go to the hospital, police or courthouse. In addition, keep track of the time you take off work to deal with any aspect of the crime.  Stay Safe   If you are scared that the offender may  harm you again, ask the police about specific steps you should take to stay safe. Request that you be told when the offender is arrested or when they are released from jail. Some communities have shelters for victims of domestic violence that offer temporary housing. The location of these shelters is kept secret to protect the people that need them.  Get Help   Don't isolate yourself. Extra support at this time is important. For the next few days, you may prefer to stay with family or a friend for emotional support and a sense of physical safety. Seek out local resources or refer to the links below for more information.  [place] for Victims of Crime ([facility])(offers victim services, referrals, articles on victim issues, and other resources) [employer], 228-783-6064)   [employer] for Victim [facility] ([facility])(articles on victims issues, provides victim assistance, coordinates the [employer] Victim Information and Referral Hotline) www.trynova.Radonna Ricker 618-680-6215)   [address] The [place], [place] [address]. All rights reserved. This information is not intended as a substitute for professional medical care. Always follow your healthcare professional's instructions.          Hepatitis C and HCV Infection  Hepatitisis inflammation of the liver.There are many types of hepatitis. One of the most common is hepatitis C, which is caused by the hepatitis C virus (HCV). In some cases, hepatitis C goes away on its own and can be cleared or cured by the normal immune system. But for most people, hepatitis C is a chronic (lifelong) condition until treatment and cure takes place with a specialist.  How are people infected?  HCV spreads from person to person through blood or body fluids. HCV infection can occur when blood containing the virus enters a healthy person's body. A person may become infected by:   Receiving a blood transfusion or organ  transplant before 1992, or clotting factors made before 1987. (Today, these products are all screened for HCV.)   Receiving kidney dialysis over a long period with blood exposure.   Using injected drugs, even once.   Having unprotected sex with an infected partner. (Being infected this way is rare, but is more likely for people with many partners.)   How HCV affects your body  Once HCV enters the body, it travels through the bloodstream to the liver. There, HCV can cause inflammation. This means that liver tissue becomes swollen and irritated. Over time, inflamed liver tissue is replaced by scar tissue (fibrosis). As the liver becomes scarred, it may be less able to do its work. After many years of damage, the liver might even stop working. This can cause serious health problems, or even death. HCV isa systemic disease and can damage the brain,  kidneys, nerves, skin, and other organs. Get tested for a rheumatoid factor. If the results are positive, have a workup for cryoglobulins. HCV is also linked to various cancers, diabetes, and fatty liver.   [address] The [place], [place] [address]. All rights reserved. This information is not intended as a substitute for professional medical care. Always follow your healthcare professional's instructions.          Treating Hepatitis C (HCV)     It's likely that hepatitis C virus (HCV) was found when routine liver tests were done on your blood or after you donated blood. Once hepatitis C is discovered, a medical evaluation helps assess if you have liver disease. You may also have a small sample (biopsy) taken from the liver to see if medications may help.  Take these steps  To help keep your body strong and possibly relieve symptoms:   Avoid stressing the liver. Do not use alcohol and any unnecessary medications -- even over-the-counter medications such as acetaminophen. These can stress the liver.Always check with your doctor first  before taking any over-the-counter medicines or supplements.   Eat a balanced diet. A diet low in fat, high in fiber, and full of fresh fruits and vegetables helps you maintain your health.   Take prescribed medications.Your doctor will talk with you about the types of medicines that will work best for you. You may need to take medications to treat hepatitis C for several months.  Follow up regularly  Hepatitis C can get worse and damage your liver without your knowing it. Stay in regular contact with your doctor and health care team. They can watch your condition and tell you about any new research and types of treatment for hepatitis C.     2000-2015 The [place], [place] [address]. All rights reserved. This information is not intended as a substitute for professional medical care. Always follow your healthcare professional's instructions.          Physical Assault [Adult]  You have been examined today for physical injuries. Because of the emotional upset that happens during a physical assault, you may not be aware of areas of pain or injury until tomorrow. Watch for the signs below.  Following a physical assault, it is normal to feel many strong emotions. Shock, embarrassment, fear, depression, blame, guilt, shame or anger are all very common and normal feelings. For a while, you may find it hard to find a sense of balance in your life. You may not be able to think clearly and you may have strong emotions about what happened to you. This is normal.  It can take time to get back to the point where you feel comfortable and safe again. Crisis intervention and supportive counseling can help you get through this.  Many states require your doctor to notify the law enforcement agency when they treat a victim of a violent crime. This does not mean that you have to prosecute or go to trial. You may be eligible for compensation of medical costs or losses related to the assault. Talk  to the local law enforcement agency for details.  Home Care:  1) Follow your doctor's advice regarding the care of any physical injuries.  2) You may use acetaminophen (Tylenol) or ibuprofen (Motrin, Advil) to control pain, unless another pain medicine was prescribed. [ NOTE : If you have chronic liver or kidney disease or ever had a stomach ulcer or  GI bleeding, talk with your doctor before using these medicines.]  3) Don't isolate yourself. For the next few days, you may prefer to stay with family or a friend for emotional support and a sense of physical safety. Seek out local resources or refer to the links below for more information.  Follow Up  with your doctor or as advised by our staff. Refer to the links below for more information.   [employer] for Victims of Crime [facility]) (offers victim services, referrals, articles on victim issues, and other resources) [employer] , [place] for Victim Assistance ([facility]) (articles on victims issues, provides victim assistance, coordinates the [employer] Victim Information and Referral Hotline) https://carter-cox.org/, (703)608-9454  [NOTE: If X-rays were taken, they will be reviewed by a radiologist. You will be notified of any other findings that may affect your care.]  Get Prompt Medical Attention  if any of the following occur:  -- New or worsening headache or visual problems  -- New or worsening neck, back, abdomen, arm or leg pain  -- Shortness of breath or increasing chest pain  -- Repeated vomiting, dizziness or fainting  -- Excessive drowsiness or unable to wake up as usual  -- Confusion or change in behavior or speech, memory loss or blurred vision  -- Redness, swelling, or pus coming from any wound   2000-2015 The [place]. [address] [place] [address]. All rights reserved. This information is not intended as a substitute for professional medical care. Always follow your healthcare professional's  instructions.          Physical Assault: Prevention  The following tips describe how to reduce your chances of being assaulted:   If you are going anywhere at night, go with a friend or group.   Always be aware of your surroundings. If the area looks deserted or poorly lit, don't risk it.   If possible, carry a cell phone at all times.   If you think you are being followed, go towards a lighted area where there are other people.   After dark, park as close as you can to the building you are going to. Have your keys in hand when you return to your car.   Don't go for walks or jog alone early in the morning or after dark. Try to plan your runs during daylight hours when others are around.   Plan your outings. Always tell a friend or family member where you are planning to go and when you will return.   Headphones and earbuds can be dangerous when you are alone because they make you less aware of your surroundings.   Avoid carrying large quantities of money. Try not to show how much money is in your wallet when purchasing items.   Go to ATM machines during daylight hours and be aware of who is behind you.   If you are attacked, yell, scream, shout or do anything to attract attention.   If you are abducted, drop a personal item that can be traced to you.  Self-defense classes can improve confidence in your ability to avoid physical conflict and defend yourself if necessary. "[employer]" has been a successful program for teaching women street-wise self-defense. (www.modelmugging.org) You may find other listings in your local [employer].   [address] The [place], [place] [address]. All rights reserved. This information is not intended as a substitute for professional medical care. Always follow  your healthcare professional's instructions.          Tests for Liver Disease  This sheet describes tests that may be done for liver problems. Your health care  provider will tell you which tests you need.    Blood Tests to Monitor the Liver  A small amount of blood may be taken and tested for one or more of the following:   AFP(alpha fetoprotein). This is a protein made by the liver. A high level of AFP in the blood can be a sign of liver cancer, or liver injury and regeneration in adults.   Albumin.This is a liver function test. It measures a protein made by the liver. When a person has liver disease, the level of albumin in the blood(serum albumin)is often low.   Alk phos(alkaline phosphatase). This is an enzyme that is mostly made in the liver and bones. It's measured with a blood test. A high level may suggest a problem with the bile ducts in the liver.   ALT(alanine aminotransferase, formerly called SGOT).ALT is an enzyme produced by the liver. When the liver is damaged, ALT leaks into the blood. If a blood test finds a high level of ALT, this can be a sign of liver problems such as inflammation, scarring, or a tumor.   Ammonia.This is a liver function test that shows when a harmful substance is left in the blood after digestion. Normally, the liver removes ammonia from the blood and turns it into urea, which leaves the body with urine. If a blood test shows that the ammonia level is too high, this process isn't happening as it should. This test is very inaccurate for liver function and should rarely be used.   AST(aspartate aminotransferase, formerly called SOGT).AST is another enzyme made by the liver. It is also measured with a blood test. High levels of AST may be a sign of liver injury, especially if the ALT level is also high.   Bilirubin.This is a liver function test. It measures the yellow substance made when the body breaks down red blood cells. Bilirubin is collected by the liver and is sent out of the body with stool. When something is wrong with the liver or bile ducts, bilirubin can build up in the body. This causes jaundice (yellowing  of the skin and the whites of the eyes). Two measurements may be taken from this test:total bilirubinanddirect bilirubin.A high total bilirubin level means the liver isn't breaking down bilirubin. A high direct bilirubin level suggests a blockage in the bile ducts. A high indirect bilirubin can indicate a condition called Gilbert syndrome. Only a small percentage of people have Gilbert syndrome. Rosanna Randy syndrome is not a sign of disease. A high indirect bilirubin can also be a sign of rapid red cell breakdown. This is why a bilirubin test is not a good way to test liver function.   CBC (complete blood count).This is a test that measures all the parts of the blood (red blood cells, white blood cells, and platelets). Problems with these counts can mean infection or illness. They can also be a sign of a problem with the spleen. (The spleen is an organ close to the liver that can be affected by liver disease.) A low platelet count is common with advanced fibrosis of the liver. It also happens when the spleen becomes enlarged and begins to absorb platelets.   GGT(gamma-glutamyl transpeptidase). This is a liver enzyme that's often measured along with other enzymes to check for liver problems.  GGT is measured with a blood test. When alk phos and GGT are both higher than normal, it may be a sign that the bile ducts in the liver may be diseased or blocked. It can also be a sign of a fatty liver of alcohol damage.   Glucose.This is a sugar in the blood and the body's most important source of energy. A healthy liver helps the body maintain a normal glucose level. If a blood test reveals that glucose is low, this may mean the liver is not working properly.   Infectious hepatitis. This is a disease that can be found with antibody and antigen tests for hepatitis A, B, C, and E.   INR (international normalized ratio). Prothrombin time (PT) tests check the ability of the blood to form clots. The liver makes a protein  that helps with clotting. Problems with clotting can be a sign of liver disease and show low levels of vitamin K.   5'-Nucleotidase (5NT). This is an enzyme that is made is several organs, but only released into the blood by the liver. A high or low level may be a sign of liver disease.   Serum bile acid(SBA). This test measures the amount of bile acid in the blood. A high level may mean that bile ducts are blocked. This test is rarely done.   Vitamins A, D, E, and K.These are vitamins that are stored in the liver and fat and released over time (fat-soluble). They are absorbed by the liver with help from bile. If a blood test shows that levels of these vitamins are low, this could mean the liver is not absorbing them properly.   Zinc.This is a nutrient that is absorbed by the liver. If a blood test shows a low zinc level, this could mean the intestine isn't absorbing zinc properly. This can worsen conditions brought on by high levels of ammonia.  Several other lab tests may be done to check for specific liver problems once liver damage is found. These include autoimmune antibodies, ceruloplasmin (Wilson's disease), an iron panel (hemochromatosis), alpha-s-antitrypsin (alpha-s-antitrypsin deficiency), and others.  Procedures to Monitor the Liver  A number of procedures may be done to check the condition or function of the liver or connected organs, such as the gallbladder or bile ducts.   Liver biopsy. This is a test to look for damage in the liver tissue. A needle is used to take a small amount of tissue from the liver. The tissue is sent to a lab where it is checked for signs of inflammation, scarring, or other problems.   CT (Computed tomography) scan. A CT scan is a series of X-rays that make a 3D picture of the liver and gallbladder. This can show gallstones, abscesses, abnormal blood vessels, or tumors.   ERCP (endoscopic retrograde cholangiopnacreatography). This is a procedure that can show is the  bile ducts are blocked or narrowed. It can also take pictures of the gallbladder. During this procedure, a small flexible tube called and endoscope is inserted into the mouth. The tube is moved down the esophagus and stomach to the top of the small intestine to where it meets the bile ducts. Dye is released through the scope to make the bile ducts show up on an X-ray. The doctor may also use small tools to take tiny samples of tissue or fluid. These are sent to a lab to be studied.   HIDA scan. This test checks gallbladder and liver function. A small amount of radioactive  fluid is put into the body. This fluid will be seen on a scan as it travels through the liver to the gallbladder and into the intestine. It can show if bile ducts are missing or blocked and if the gallbladder is working properly. It can also show other problems in the biliary tree, or bile ducts.   MRI (magnetic resonance imaging). This test uses magnets, radio waves, and a computer to create an image ofthe organs and tissues in your body. MRCP (magnetic resonance cholangiopnacreatography) is a more detailed test. It can show abnormal or narrow bile ducts, tumors, and/or gallstones.   Sonogram (ultrasound). This test uses sound waves and a computer to create a picture of the liver, gallbladder, and bile ducts. It can show gallstones, tumors, or fat in the liver. It is also used to check the condition of the blood vessels and look for bile collections (where bile may leak out of the liver).   [address] The [place], [place] [address]. All rights reserved. This information is not intended as a substitute for professional medical care. Always follow your healthcare professional's instructions.

## 2015-06-11 NOTE — ED Notes (Signed)
Pt unsure of circumstances to pubic pain and discharge. Pt has been incarcerated for 24 hours. Denies fevers, sweats, chills, NVD, abdominal pain

## 2015-06-12 LAB — RPR: RPR: NONREACTIVE

## 2015-06-20 ENCOUNTER — Ambulatory Visit: Payer: Self-pay | Admitting: *Deleted

## 2015-06-27 ENCOUNTER — Ambulatory Visit: Payer: Self-pay | Admitting: Family Medicine

## 2015-07-10 ENCOUNTER — Telehealth: Payer: Self-pay | Admitting: *Deleted

## 2015-07-10 NOTE — Telephone Encounter (Signed)
CBC CMET INR DUE

## 2015-07-24 ENCOUNTER — Ambulatory Visit: Payer: Self-pay | Admitting: Family Medicine

## 2015-07-26 ENCOUNTER — Telehealth: Payer: Self-pay | Admitting: *Deleted

## 2015-07-26 NOTE — Telephone Encounter (Signed)
Patient called asking for lab results to be emailed to her. Patient has mychart access, RN helped her find the lab results she was looking for. Andree CossHowell, Dawood Spitler M, RN

## 2015-07-31 ENCOUNTER — Ambulatory Visit: Payer: Self-pay | Admitting: Family Medicine

## 2015-08-01 ENCOUNTER — Ambulatory Visit: Payer: Self-pay | Admitting: Family Medicine

## 2015-08-01 ENCOUNTER — Encounter: Payer: Self-pay | Admitting: *Deleted

## 2015-08-01 NOTE — Telephone Encounter (Signed)
Patient never returned my call. Mailed letter for patient to contact the office to schedule an appointment with a new provider as soon as she can. She is due for follow up labs and needs to establish care for Hep C and acholic cirrhosis

## 2015-09-10 ENCOUNTER — Ambulatory Visit: Payer: Medicaid Other | Admitting: Adult Health

## 2015-09-11 ENCOUNTER — Telehealth: Payer: Self-pay | Admitting: Family Medicine

## 2015-09-11 ENCOUNTER — Encounter: Payer: Self-pay | Admitting: Adult Health

## 2015-09-11 NOTE — Telephone Encounter (Signed)
Patient called requesting medication refill on Ondansetron HCl (ZOFRAN PO), Diclofenac Sodium 3 % GEL, and Tramadol. Patient is currently out of town, please f/u

## 2015-09-13 NOTE — Telephone Encounter (Signed)
OV needed for med refills Patient has not be here in 5 months

## 2015-09-20 NOTE — Telephone Encounter (Signed)
LVM to return call   Pt needs to schedule appointment F/U Medication refills

## 2015-10-09 ENCOUNTER — Ambulatory Visit (HOSPITAL_BASED_OUTPATIENT_CLINIC_OR_DEPARTMENT_OTHER): Payer: Medicaid Other | Admitting: Family Medicine

## 2015-10-09 ENCOUNTER — Encounter: Payer: Self-pay | Admitting: Family Medicine

## 2015-10-09 ENCOUNTER — Ambulatory Visit (HOSPITAL_COMMUNITY)
Admission: RE | Admit: 2015-10-09 | Discharge: 2015-10-09 | Disposition: A | Discharge status: Home / Self Care | Payer: Medicaid Other | Source: Ambulatory Visit | Attending: Family Medicine | Admitting: Family Medicine

## 2015-10-09 VITALS — BP 99/62 | HR 99 | Temp 98.9°F | Resp 16 | Ht 65.0 in | Wt 121.0 lb

## 2015-10-09 DIAGNOSIS — R110 Nausea: Secondary | ICD-10-CM

## 2015-10-09 DIAGNOSIS — J4520 Mild intermittent asthma, uncomplicated: Secondary | ICD-10-CM | POA: Insufficient documentation

## 2015-10-09 DIAGNOSIS — M791 Myalgia, unspecified site: Secondary | ICD-10-CM

## 2015-10-09 DIAGNOSIS — R918 Other nonspecific abnormal finding of lung field: Secondary | ICD-10-CM | POA: Insufficient documentation

## 2015-10-09 DIAGNOSIS — K7030 Alcoholic cirrhosis of liver without ascites: Secondary | ICD-10-CM | POA: Diagnosis not present

## 2015-10-09 DIAGNOSIS — Z0000 Encounter for general adult medical examination without abnormal findings: Secondary | ICD-10-CM | POA: Diagnosis not present

## 2015-10-09 DIAGNOSIS — R05 Cough: Secondary | ICD-10-CM | POA: Diagnosis present

## 2015-10-09 LAB — COMPLETE METABOLIC PANEL WITH GFR
ALBUMIN: 3.5 g/dL — AB (ref 3.6–5.1)
ALK PHOS: 110 U/L (ref 33–115)
ALT: 17 U/L (ref 6–29)
AST: 37 U/L — ABNORMAL HIGH (ref 10–30)
BILIRUBIN TOTAL: 1.4 mg/dL — AB (ref 0.2–1.2)
BUN: 8 mg/dL (ref 7–25)
CALCIUM: 9.1 mg/dL (ref 8.6–10.2)
CO2: 24 mmol/L (ref 20–31)
Chloride: 105 mmol/L (ref 98–110)
Creat: 0.44 mg/dL — ABNORMAL LOW (ref 0.50–1.10)
GFR, Est African American: >89 mL/min (ref >=60)
GLUCOSE: 104 mg/dL — AB (ref 65–99)
POTASSIUM: 4.2 mmol/L (ref 3.5–5.3)
Sodium: 136 mmol/L (ref 135–146)
TOTAL PROTEIN: 7.4 g/dL (ref 6.1–8.1)

## 2015-10-09 LAB — POCT GLYCOSYLATED HEMOGLOBIN (HGB A1C): Hemoglobin A1C: 4.9

## 2015-10-09 MED ORDER — ALBUTEROL SULFATE HFA 108 (90 BASE) MCG/ACT IN AERS: 2.0000 | INHALATION_SPRAY | Freq: Four times a day (QID) | RESPIRATORY_TRACT | Status: DC | PRN

## 2015-10-09 MED ORDER — TRAMADOL HCL 50 MG PO TABS: 50.0000 mg | ORAL_TABLET | Freq: Three times a day (TID) | ORAL | Status: DC | PRN

## 2015-10-09 MED ORDER — ONDANSETRON HCL 4 MG PO TABS: 4.0000 mg | ORAL_TABLET | Freq: Three times a day (TID) | ORAL | Status: DC | PRN

## 2015-10-09 MED ORDER — DICLOFENAC SODIUM 3 % TD GEL
1.0000 | TRANSDERMAL | Status: DC | PRN
Start: 2015-10-09 — End: 2017-07-30

## 2015-10-09 NOTE — Addendum Note (Signed)
Addended by: Earnestine Leys on: 10/09/2015 05:33 PM   Modules accepted: Orders

## 2015-10-09 NOTE — Patient Instructions (Addendum)
Mary Garrison was seen today for medication refill.  Diagnoses and all orders for this visit:  Healthcare maintenance -     HgB A1c  Alcoholic cirrhosis of liver without ascites (HCC) -     CBC -     COMPLETE METABOLIC PANEL WITH GFR  Muscle pain -     Diclofenac Sodium 3 % GEL; Place 1 application onto the skin as needed (for muscle pain). -     traMADol (ULTRAM) 50 MG tablet; Take 1 tablet (50 mg total) by mouth every 8 (eight) hours as needed.  Asthma, mild intermittent, uncomplicated -     albuterol (PROVENTIL HFA;VENTOLIN HFA) 108 (90 Base) MCG/ACT inhaler; Inhale 2 puffs into the lungs every 6 (six) hours as needed. For shortness of breath -     DG Chest 2 View; Future    F/u in 6 weeks for asthma  Dr. Armen Pickup

## 2015-10-09 NOTE — Progress Notes (Signed)
Medicine refills  Pains scale # 6 body ache  Tobacco user 1/2 per day  No ETOH  No suicidal thoughts in the past two weeks

## 2015-10-09 NOTE — Progress Notes (Signed)
Subjective:  Patient ID: Mary Garrison, female    DOB: November 30, 1990  Age: 25 y.o. MRN: [id number]  CC: Medication Refill   HPI Poet Hineman presents for    1. Med refills: requesting tramadol, Zofran and diclofenac gel. Has pain in low back and knees. Pain is worse when walking up stairs. Pain worsened following brief time in jail for DUI charge. No recent trauma.  2. Wheezing: having wheezing and shortness of breath with activity. Having cough that is intermittent and dry. No fever or chills. Smokes 1/2 PPD.   3. Weight loss: lost 12 lbs , down to 100 # while in jail due to refusal to eat. Has been out for 3 weeks. Has gained back lost weight plus 10 lbs.   4. Chronic nausea: has hx of ETOH abuse last use 3 months ago. Has hx of eating disorder. No emesis. No fever or chills.   Social History  Substance Use Topics  . Smoking status: Current Every Day Smoker -- 0.50 packs/day for 10 years    Types: Cigarettes  . Smokeless tobacco: Never Used  . Alcohol Use: No     Comment: Last ETOH Nov 2016   Outpatient Prescriptions Prior to Visit  Medication Sig Dispense Refill  . albuterol (PROVENTIL HFA;VENTOLIN HFA) 108 (90 BASE) MCG/ACT inhaler Inhale 2 puffs into the lungs every 6 (six) hours as needed. For shortness of breath (Patient not taking: Reported on 10/09/2015) 8 g 5  . calcium-vitamin D (OSCAL WITH D) 500-200 MG-UNIT per tablet Take 1 tablet by mouth 2 (two) times daily. (Patient not taking: Reported on 10/09/2015) 180 tablet 1  . chlordiazePOXIDE (LIBRIUM) 25 MG capsule  PO TID x 1D, then 25-50mg  PO BID X 1D, then 25-50mg  PO QD X 1D (Patient not taking: Reported on 10/09/2015) 10 capsule 0  . cholecalciferol (VITAMIN D) 1000 UNITS tablet Take 1 tablet (1,000 Units total) by mouth daily. (Patient not taking: Reported on 05/20/2015) 90 tablet 3  . ciprofloxacin (CIPRO) 500 MG tablet Take 1 tablet (500 mg total) by mouth every 12 (twelve) hours. (Patient not taking: Reported on  05/20/2015) 6 tablet 0  . Diclofenac Sodium 3 % GEL Place 1 application onto the skin as needed (for muscle pain). (Patient not taking: Reported on 05/20/2015) 100 g 0  . folic acid (FOLVITE) 1 MG tablet Take 1 tablet (1 mg total) by mouth daily. (Patient not taking: Reported on 05/20/2015) 90 tablet 3  . Ondansetron HCl (ZOFRAN PO) Take 1 tablet by mouth once. Reported on 10/09/2015    . pantoprazole (PROTONIX) 40 MG tablet Take 1 tablet (40 mg total) by mouth daily. (Patient not taking: Reported on 05/20/2015) 30 tablet 0  . rizatriptan (MAXALT-MLT) 10 MG disintegrating tablet Take 1 tablet (10 mg total) by mouth 3 (three) times daily as needed for migraine. (Patient not taking: Reported on 05/20/2015) 12 tablet 3  . topiramate (TOPAMAX) 25 MG tablet Take one tablet at night for one week, then take 2 tablets at night (Patient not taking: Reported on 05/20/2015) 60 tablet 3  . traZODone (DESYREL) 50 MG tablet Take 0.5-1 tablets (25-50 mg total) by mouth at bedtime as needed for sleep. (Patient not taking: Reported on 05/20/2015) 30 tablet 3   No facility-administered medications prior to visit.    ROS Review of Systems  Constitutional: Negative for fever and chills.  Eyes: Negative for visual disturbance.  Respiratory: Positive for cough, shortness of breath and wheezing.   Cardiovascular: Negative for chest pain.  Gastrointestinal: Positive for nausea. Negative for vomiting, abdominal pain and blood in stool.  Musculoskeletal: Negative for back pain, joint swelling and arthralgias.  Skin: Negative for rash.  Allergic/Immunologic: Negative for immunocompromised state.  Hematological: Negative for adenopathy. Does not bruise/bleed easily.  Psychiatric/Behavioral: Negative for suicidal ideas and dysphoric mood.    Objective:  BP 99/62 mmHg  Pulse 99  Temp(Src) 98.9 F (37.2 C) (Oral)  Resp 16  Ht  (1.651 m)  Wt 121 lb (54.885 kg)  BMI 20.14 kg/m2  SpO2 99%  BP/Weight 10/09/2015  05/20/2015 05/20/2015  Systolic BP 99 121 115  Diastolic BP 62 72 55  Wt. (Lbs) 121 - -  BMI 20.14 - -   Wt Readings from Last 3 Encounters:  10/09/15 121 lb (54.885 kg)  05/11/15 112 lb 4 oz (50.916 kg)  05/03/15 111 lb 8 oz (50.576 kg)    Physical Exam  Constitutional: She is oriented to person, place, and time. She appears well-developed and well-nourished. No distress.  HENT:  Head: Normocephalic and atraumatic.  Cardiovascular: Normal rate, regular rhythm, normal heart sounds and intact distal pulses.   Pulmonary/Chest: Effort normal and breath sounds normal.  Musculoskeletal: She exhibits no edema.  Neurological: She is alert and oriented to person, place, and time.  Skin: Skin is warm and dry. No rash noted.  Psychiatric: She has a normal mood and affect.   Assessment & Plan:   Levetta was seen today for medication refill.  Diagnoses and all orders for this visit:  Healthcare maintenance -     Cancel: HgB A1c  Alcoholic cirrhosis of liver without ascites (HCC) -     CBC -     COMPLETE METABOLIC PANEL WITH GFR  Muscle pain -     Diclofenac Sodium 3 % GEL; Place 1 application onto the skin as needed (for muscle pain). -     traMADol (ULTRAM) 50 MG tablet; Take 1 tablet (50 mg total) by mouth every 8 (eight) hours as needed.  Asthma, mild intermittent, uncomplicated -     albuterol (PROVENTIL HFA;VENTOLIN HFA) 108 (90 Base) MCG/ACT inhaler; Inhale 2 puffs into the lungs every 6 (six) hours as needed. For shortness of breath -     DG Chest 2 View; Future  Chronic nausea -     ondansetron (ZOFRAN) 4 MG tablet; Take 1 tablet (4 mg total) by mouth every 8 (eight) hours as needed for nausea or vomiting. Reported on 10/09/2015   No orders of the defined types were placed in this encounter.    Follow-up: No Follow-up on file.   Dessa Phi MD

## 2015-10-10 ENCOUNTER — Encounter: Payer: Self-pay | Admitting: Clinical

## 2015-10-10 LAB — CBC
HEMATOCRIT: 28.3 % — AB (ref 36.0–46.0)
HEMOGLOBIN: 8.9 g/dL — AB (ref 12.0–15.0)
MCH: 23.4 pg — AB (ref 26.0–34.0)
MCHC: 31.4 g/dL (ref 30.0–36.0)
MCV: 74.5 fL — ABNORMAL LOW (ref 78.0–100.0)
Platelets: 102 10*3/uL — ABNORMAL LOW (ref 150–400)
RBC: 3.8 MIL/uL — AB (ref 3.87–5.11)
RDW: 23.3 % — ABNORMAL HIGH (ref 11.5–15.5)
WBC: 2.5 10*3/uL — AB (ref 4.0–10.5)

## 2015-10-10 NOTE — Progress Notes (Signed)
Depression screen [facility] 2/9 10/09/2015 04/17/2015 03/06/2015 02/16/2015 02/13/2015  Decreased Interest 1 2 0 0 0  Down, Depressed, Hopeless 3 2 0 0 0  PHQ - 2 Score 4 4 0 0 0  Altered sleeping 2 2 - - -  Tired, decreased energy 1 2 - - -  Change in appetite 2 0 - - -  Feeling bad or failure about yourself  3 2 - - -  Trouble concentrating 0 0 - - -  Moving slowly or fidgety/restless 0 0 - - -  Suicidal thoughts 2 0 - - -  PHQ-9 Score 14 10 - - -    GAD 7 : Generalized Anxiety Score 10/09/2015  Nervous, Anxious, on Edge 3  Control/stop worrying 3  Worry too much - different things 3  Trouble relaxing 2  Restless 0  Easily annoyed or irritable 2  Afraid - awful might happen 2  Total GAD 7 Score 15     10/09/15, pt writes "don't want to harm myself"

## 2015-10-11 ENCOUNTER — Telehealth: Payer: Self-pay | Admitting: Family Medicine

## 2015-11-13 ENCOUNTER — Other Ambulatory Visit: Payer: Self-pay | Admitting: Family Medicine

## 2015-11-13 ENCOUNTER — Ambulatory Visit: Payer: Medicaid Other | Attending: Family Medicine | Admitting: Family Medicine

## 2015-11-13 ENCOUNTER — Encounter: Payer: Self-pay | Admitting: Family Medicine

## 2015-11-13 VITALS — BP 114/67 | HR 98 | Temp 98.3°F | Resp 16 | Ht 65.0 in | Wt 126.0 lb

## 2015-11-13 DIAGNOSIS — Z79899 Other long term (current) drug therapy: Secondary | ICD-10-CM | POA: Diagnosis not present

## 2015-11-13 DIAGNOSIS — R109 Unspecified abdominal pain: Secondary | ICD-10-CM | POA: Insufficient documentation

## 2015-11-13 DIAGNOSIS — F17210 Nicotine dependence, cigarettes, uncomplicated: Secondary | ICD-10-CM | POA: Diagnosis not present

## 2015-11-13 DIAGNOSIS — K7010 Alcoholic hepatitis without ascites: Secondary | ICD-10-CM | POA: Insufficient documentation

## 2015-11-13 DIAGNOSIS — D61818 Other pancytopenia: Secondary | ICD-10-CM | POA: Diagnosis not present

## 2015-11-13 DIAGNOSIS — B1920 Unspecified viral hepatitis C without hepatic coma: Secondary | ICD-10-CM | POA: Diagnosis not present

## 2015-11-13 DIAGNOSIS — Z8719 Personal history of other diseases of the digestive system: Secondary | ICD-10-CM

## 2015-11-13 DIAGNOSIS — K7030 Alcoholic cirrhosis of liver without ascites: Secondary | ICD-10-CM

## 2015-11-13 DIAGNOSIS — K0889 Other specified disorders of teeth and supporting structures: Secondary | ICD-10-CM | POA: Diagnosis not present

## 2015-11-13 NOTE — Patient Instructions (Addendum)
Mary Garrison was seen today for abdominal pain.  Diagnoses and all orders for this visit:  Alcoholic cirrhosis of liver without ascites (HCC) -     US Abdomen Complete; Future -     CBC -     COMPLETE METABOLIC PANEL WITH GFR  Pain of molar -     Ambulatory referral to Dentistry   Low salt diet for abdominal swelling  Remember to call to schedule your pap smear with your gynecologist. I also do pap smears  F/u in 6 weeks, sooner if abdominal pain and swelling worsens or fails to improve despite low salt diet   Dr. Armen PickupFunches   Low-Sodium Eating Plan Sodium raises blood pressure and causes water to be held in the body. Getting less sodium from food will help lower your blood pressure, reduce any swelling, and protect your heart, liver, and kidneys. We get sodium by adding salt (sodium chloride) to food. Most of our sodium comes from canned, boxed, and frozen foods. Restaurant foods, fast foods, and pizza are also very high in sodium. Even if you take medicine to lower your blood pressure or to reduce fluid in your body, getting less sodium from your food is important. WHAT IS MY PLAN? Most people should limit their sodium intake to 2,300 mg a day. Your health care provider recommends that you limit your sodium intake to ___2000_______ a day.  WHAT DO I NEED TO KNOW ABOUT THIS EATING PLAN? For the low-sodium eating plan, you will follow these general guidelines:  Choose foods with a % Daily Value for sodium of less than 5% (as listed on the food label).   Use salt-free seasonings or herbs instead of table salt or sea salt.   Check with your health care provider or pharmacist before using salt substitutes.   Eat fresh foods.  Eat more vegetables and fruits.  Limit canned vegetables. If you do use them, rinse them well to decrease the sodium.   Limit cheese to 1 oz (28 g) per day.   Eat lower-sodium products, often labeled as "lower sodium" or "no salt added."  Avoid foods that  contain monosodium glutamate (MSG). MSG is sometimes added to [place] food and some canned foods.  Check food labels (Nutrition Facts labels) on foods to learn how much sodium is in one serving.  Eat more home-cooked food and less restaurant, buffet, and fast food.  When eating at a restaurant, ask that your food be prepared with less salt, or no salt if possible.  HOW DO I READ FOOD LABELS FOR SODIUM INFORMATION? The Nutrition Facts label lists the amount of sodium in one serving of the food. If you eat more than one serving, you must multiply the listed amount of sodium by the number of servings. Food labels may also identify foods as:  Sodium free--Less than 5 mg in a serving.  Very low sodium--35 mg or less in a serving.  Low sodium--140 mg or less in a serving.  Light in sodium--50% less sodium in a serving. For example, if a food that usually has 300 mg of sodium is changed to become light in sodium, it will have 150 mg of sodium.  Reduced sodium--25% less sodium in a serving. For example, if a food that usually has 400 mg of sodium is changed to reduced sodium, it will have 300 mg of sodium. WHAT FOODS CAN I EAT? Grains Low-sodium cereals, including oats, puffed wheat and rice, and shredded wheat cereals. Low-sodium crackers. Unsalted rice and  pasta. Lower-sodium bread.  Vegetables Frozen or fresh vegetables. Low-sodium or reduced-sodium canned vegetables. Low-sodium or reduced-sodium tomato sauce and paste. Low-sodium or reduced-sodium tomato and vegetable juices.  Fruits Fresh, frozen, and canned fruit. Fruit juice.  Meat and Other Protein Products Low-sodium canned tuna and salmon. Fresh or frozen meat, poultry, seafood, and fish. Lamb. Unsalted nuts. Dried beans, peas, and lentils without added salt. Unsalted canned beans. Homemade soups without salt. Eggs.  Dairy Milk. Soy milk. Ricotta cheese. Low-sodium or reduced-sodium cheeses. Yogurt.  Condiments Fresh and  dried herbs and spices. Salt-free seasonings. Onion and garlic powders. Low-sodium varieties of mustard and ketchup. Fresh or refrigerated horseradish. Lemon juice.  Fats and Oils Reduced-sodium salad dressings. Unsalted butter.  Other Unsalted popcorn and pretzels.  The items listed above may not be a complete list of recommended foods or beverages. Contact your dietitian for more options. WHAT FOODS ARE NOT RECOMMENDED? Grains Instant hot cereals. Bread stuffing, pancake, and biscuit mixes. Croutons. Seasoned rice or pasta mixes. Noodle soup cups. Boxed or frozen macaroni and cheese. Self-rising flour. Regular salted crackers. Vegetables Regular canned vegetables. Regular canned tomato sauce and paste. Regular tomato and vegetable juices. Frozen vegetables in sauces. Salted [place] fries. Olives. Rosita Fire. Relishes. Sauerkraut. Salsa. Meat and Other Protein Products Salted, canned, smoked, spiced, or pickled meats, seafood, or fish. Bacon, ham, sausage, hot dogs, corned beef, chipped beef, and packaged luncheon meats. Salt pork. Jerky. Pickled herring. Anchovies, regular canned tuna, and sardines. Salted nuts. Dairy Processed cheese and cheese spreads. Cheese curds. Blue cheese and cottage cheese. Buttermilk.  Condiments Onion and garlic salt, seasoned salt, table salt, and sea salt. Canned and packaged gravies. Worcestershire sauce. Tartar sauce. Barbecue sauce. Teriyaki sauce. Soy sauce, including reduced sodium. Steak sauce. Fish sauce. Oyster sauce. Cocktail sauce. Horseradish that you find on the shelf. Regular ketchup and mustard. Meat flavorings and tenderizers. Bouillon cubes. Hot sauce. Tabasco sauce. Marinades. Taco seasonings. Relishes. Fats and Oils Regular salad dressings. Salted butter. Margarine. Ghee. Bacon fat.  Other Potato and tortilla chips. Corn chips and puffs. Salted popcorn and pretzels. Canned or dried soups. Pizza. Frozen entrees and pot pies.  The items  listed above may not be a complete list of foods and beverages to avoid. Contact your dietitian for more information.   This information is not intended to replace advice given to you by your health care provider. Make sure you discuss any questions you have with your health care provider.   Document Released: 01/24/2002 Document Revised: 08/25/2014 Document Reviewed: 06/08/2013 Elsevier Interactive Patient Education [employer]

## 2015-11-13 NOTE — Progress Notes (Signed)
C/C swelling on legs and abdominal area  Elevate temperature 100.4 Pain scale # 7 Tobacco user 1/2 ppday  No suicidal thoughts in the past two weeks

## 2015-11-13 NOTE — Progress Notes (Signed)
Subjective:  Patient ID: Mary Garrison, female    DOB: March 13, 1991  Age: 25 y.o. MRN: [id number]  CC: Abdominal Pain   HPI Ketzia Guzek has hx of hep C with spontaneous viral resolution,  alcohol abuse, chronic hepatitis, anorexia,    1. Abdominal pain; abdominal pain with nausea, fever and chills started 5 days ago. She has also noted worsening abdominal swelling.  No known sick contacts. No emesis, dysuria, cough, CP, SOB. She feels better today. She denies ETOH.  She admits to high salt diet. She is working at [employer]. She is in [place] and parenting classes.   2. Dental pain: R upper molar pain. No jaw swelling. She has not been to a dentist in the past 6 months. Requesting dental referral. Her medicaid will expire at the end of the month.   Social History  Substance Use Topics  . Smoking status: Current Every Day Smoker -- 0.50 packs/day for 10 years    Types: Cigarettes  . Smokeless tobacco: Never Used  . Alcohol Use: No     Comment: Last ETOH Nov 2016    Outpatient Prescriptions Prior to Visit  Medication Sig Dispense Refill  . albuterol (PROVENTIL HFA;VENTOLIN HFA) 108 (90 Base) MCG/ACT inhaler Inhale 2 puffs into the lungs every 6 (six) hours as needed. For shortness of breath 8 g 5  . Diclofenac Sodium 3 % GEL Place 1 application onto the skin as needed (for muscle pain). 100 g 0  . folic acid (FOLVITE) 1 MG tablet TAKE ONE TABLET BY MOUTH ONCE DAILY 90 tablet 0  . ondansetron (ZOFRAN) 4 MG tablet Take 1 tablet (4 mg total) by mouth every 8 (eight) hours as needed for nausea or vomiting. Reported on 10/09/2015 30 tablet 0  . traMADol (ULTRAM) 50 MG tablet Take 1 tablet (50 mg total) by mouth every 8 (eight) hours as needed. 60 tablet 0   No facility-administered medications prior to visit.    ROS Review of Systems  Constitutional: Negative for fever and chills.  HENT: Positive for dental problem.   Eyes: Negative for visual disturbance.  Respiratory: Negative for  shortness of breath.   Cardiovascular: Negative for chest pain and leg swelling.  Gastrointestinal: Positive for nausea and abdominal distention. Negative for vomiting, abdominal pain, diarrhea, constipation, blood in stool, anal bleeding and rectal pain.  Genitourinary: Positive for dysuria.  Musculoskeletal: Negative for back pain and arthralgias.  Skin: Negative for rash.  Allergic/Immunologic: Negative for immunocompromised state.  Hematological: Negative for adenopathy. Does not bruise/bleed easily.  Psychiatric/Behavioral: Negative for suicidal ideas and dysphoric mood.    Objective:  BP 114/67 mmHg  Pulse 98  Temp(Src) 98.3 F (36.8 C) (Oral)  Resp 16  Ht  (1.651 m)  Wt 126 lb (57.153 kg)  BMI 20.97 kg/m2  SpO2 98%  BP/Weight 11/13/2015 10/09/2015 05/20/2015  Systolic BP 114 99 121  Diastolic BP 67 62 72  Wt. (Lbs) 126 121 -  BMI 20.97 20.14 -   Physical Exam  Constitutional: She is oriented to person, place, and time. She appears well-developed and well-nourished. No distress.  HENT:  Head: Normocephalic and atraumatic.  Cardiovascular: Normal rate, regular rhythm, normal heart sounds and intact distal pulses.   Pulmonary/Chest: Effort normal and breath sounds normal.  Musculoskeletal: She exhibits no edema.  Neurological: She is alert and oriented to person, place, and time.  Skin: Skin is warm and dry. No rash noted.  Psychiatric: She has a normal mood and affect.  Assessment & Plan:   There are no diagnoses linked to this encounter. Apolonio Schneidersshleigh was seen today for abdominal pain.  Diagnoses and all orders for this visit:  Alcoholic cirrhosis of liver without ascites (HCC) -     US Abdomen Complete; Future -     CBC -     COMPLETE METABOLIC PANEL WITH GFR  Pain of molar -     Ambulatory referral to Dentistry   No orders of the defined types were placed in this encounter.    Follow-up: No Follow-up on file.   Dessa PhiJosalyn Kendra Grissett MD

## 2015-11-14 LAB — CBC
HCT: 26.7 % — ABNORMAL LOW (ref 36.0–46.0)
HEMOGLOBIN: 8.4 g/dL — AB (ref 12.0–15.0)
MCH: 22.2 pg — AB (ref 26.0–34.0)
MCHC: 31.5 g/dL (ref 30.0–36.0)
MCV: 70.6 fL — AB (ref 78.0–100.0)
Platelets: 106 10*3/uL — ABNORMAL LOW (ref 150–400)
RBC: 3.78 MIL/uL — AB (ref 3.87–5.11)
RDW: 20.2 % — ABNORMAL HIGH (ref 11.5–15.5)
WBC: 3.4 10*3/uL — ABNORMAL LOW (ref 4.0–10.5)

## 2015-11-14 LAB — COMPLETE METABOLIC PANEL WITH GFR
ALBUMIN: 3.7 g/dL (ref 3.6–5.1)
ALT: 11 U/L (ref 6–29)
AST: 27 U/L (ref 10–30)
Alkaline Phosphatase: 110 U/L (ref 33–115)
BUN: 12 mg/dL (ref 7–25)
CO2: 23 mmol/L (ref 20–31)
Calcium: 9.1 mg/dL (ref 8.6–10.2)
Chloride: 108 mmol/L (ref 98–110)
Creat: 0.55 mg/dL (ref 0.50–1.10)
Glucose, Bld: 93 mg/dL (ref 65–99)
Potassium: 4.5 mmol/L (ref 3.5–5.3)
Sodium: 139 mmol/L (ref 135–146)
TOTAL PROTEIN: 7.2 g/dL (ref 6.1–8.1)
Total Bilirubin: 1.3 mg/dL — ABNORMAL HIGH (ref 0.2–1.2)

## 2015-11-15 ENCOUNTER — Telehealth: Payer: Self-pay | Admitting: *Deleted

## 2015-11-15 NOTE — Telephone Encounter (Signed)
LVM to return call  US APPOINTMENT ON APR 3,2017 AT 7:30 ARRIVING 15 MIN EARLY NOTHING TO EAT OR DRINK AFTER MID NIGHT

## 2015-11-16 DIAGNOSIS — D61818 Other pancytopenia: Secondary | ICD-10-CM | POA: Insufficient documentation

## 2015-11-16 MED ORDER — FOLIC ACID 1 MG PO TABS: 1.0000 mg | ORAL_TABLET | Freq: Every day | ORAL | Status: DC

## 2015-11-16 MED ORDER — FERROUS SULFATE 325 (65 FE) MG PO TABS: 325.0000 mg | ORAL_TABLET | Freq: Two times a day (BID) | ORAL | Status: DC

## 2015-11-16 NOTE — Assessment & Plan Note (Signed)
A; abdominal pain and distension concerning for cirrhosis.  No fever to suggest SBP. Patient denies ETOH. P: CMP CBC ABUS Low sodium diet

## 2015-11-16 NOTE — Addendum Note (Signed)
Addended by: Dessa PhiFUNCHES, Cletis Clack on: 11/16/2015 12:22 PM   Modules accepted: Orders

## 2015-11-16 NOTE — Assessment & Plan Note (Addendum)
Pancytopenia with microcytic anemia  Plan for iron supplement Check iron studies  Recheck HIV screen and Hep C viral load

## 2015-11-16 NOTE — Assessment & Plan Note (Addendum)
LFTs have normalized  ABUS to evaluate for cirrhosis of liver

## 2015-11-19 ENCOUNTER — Ambulatory Visit (HOSPITAL_COMMUNITY): Admission: RE | Admit: 2015-11-19 | Payer: Medicaid Other | Source: Ambulatory Visit

## 2015-12-20 ENCOUNTER — Ambulatory Visit: Payer: Self-pay | Admitting: Family Medicine

## 2016-02-16 IMAGING — DX DG CHEST 2V
2 series · 2 of 2 positions shown · non-contrast
Comparison: 02/16/2015.

CLINICAL DATA: Persistent cough for 1 week.

EXAM:
CHEST  2 VIEW

[chest pa]
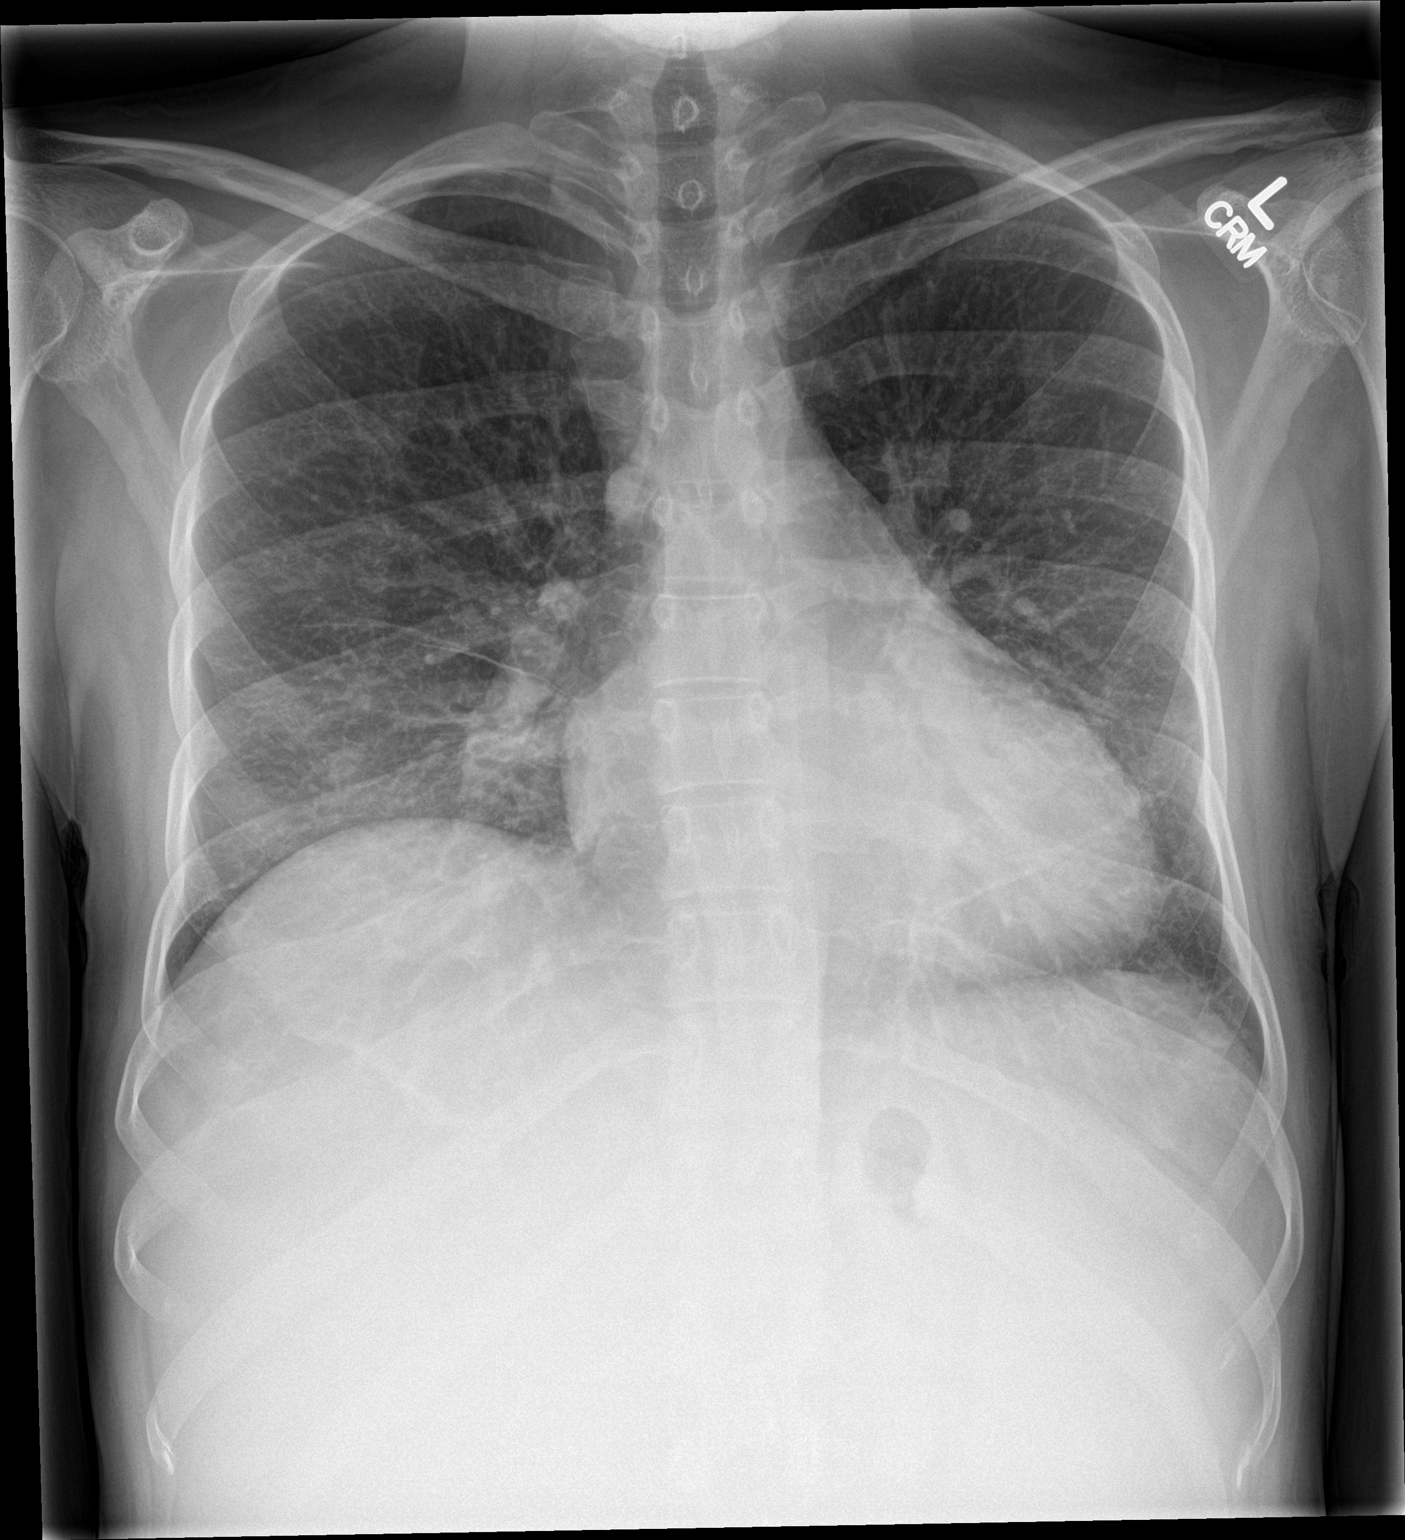

[chest lat]
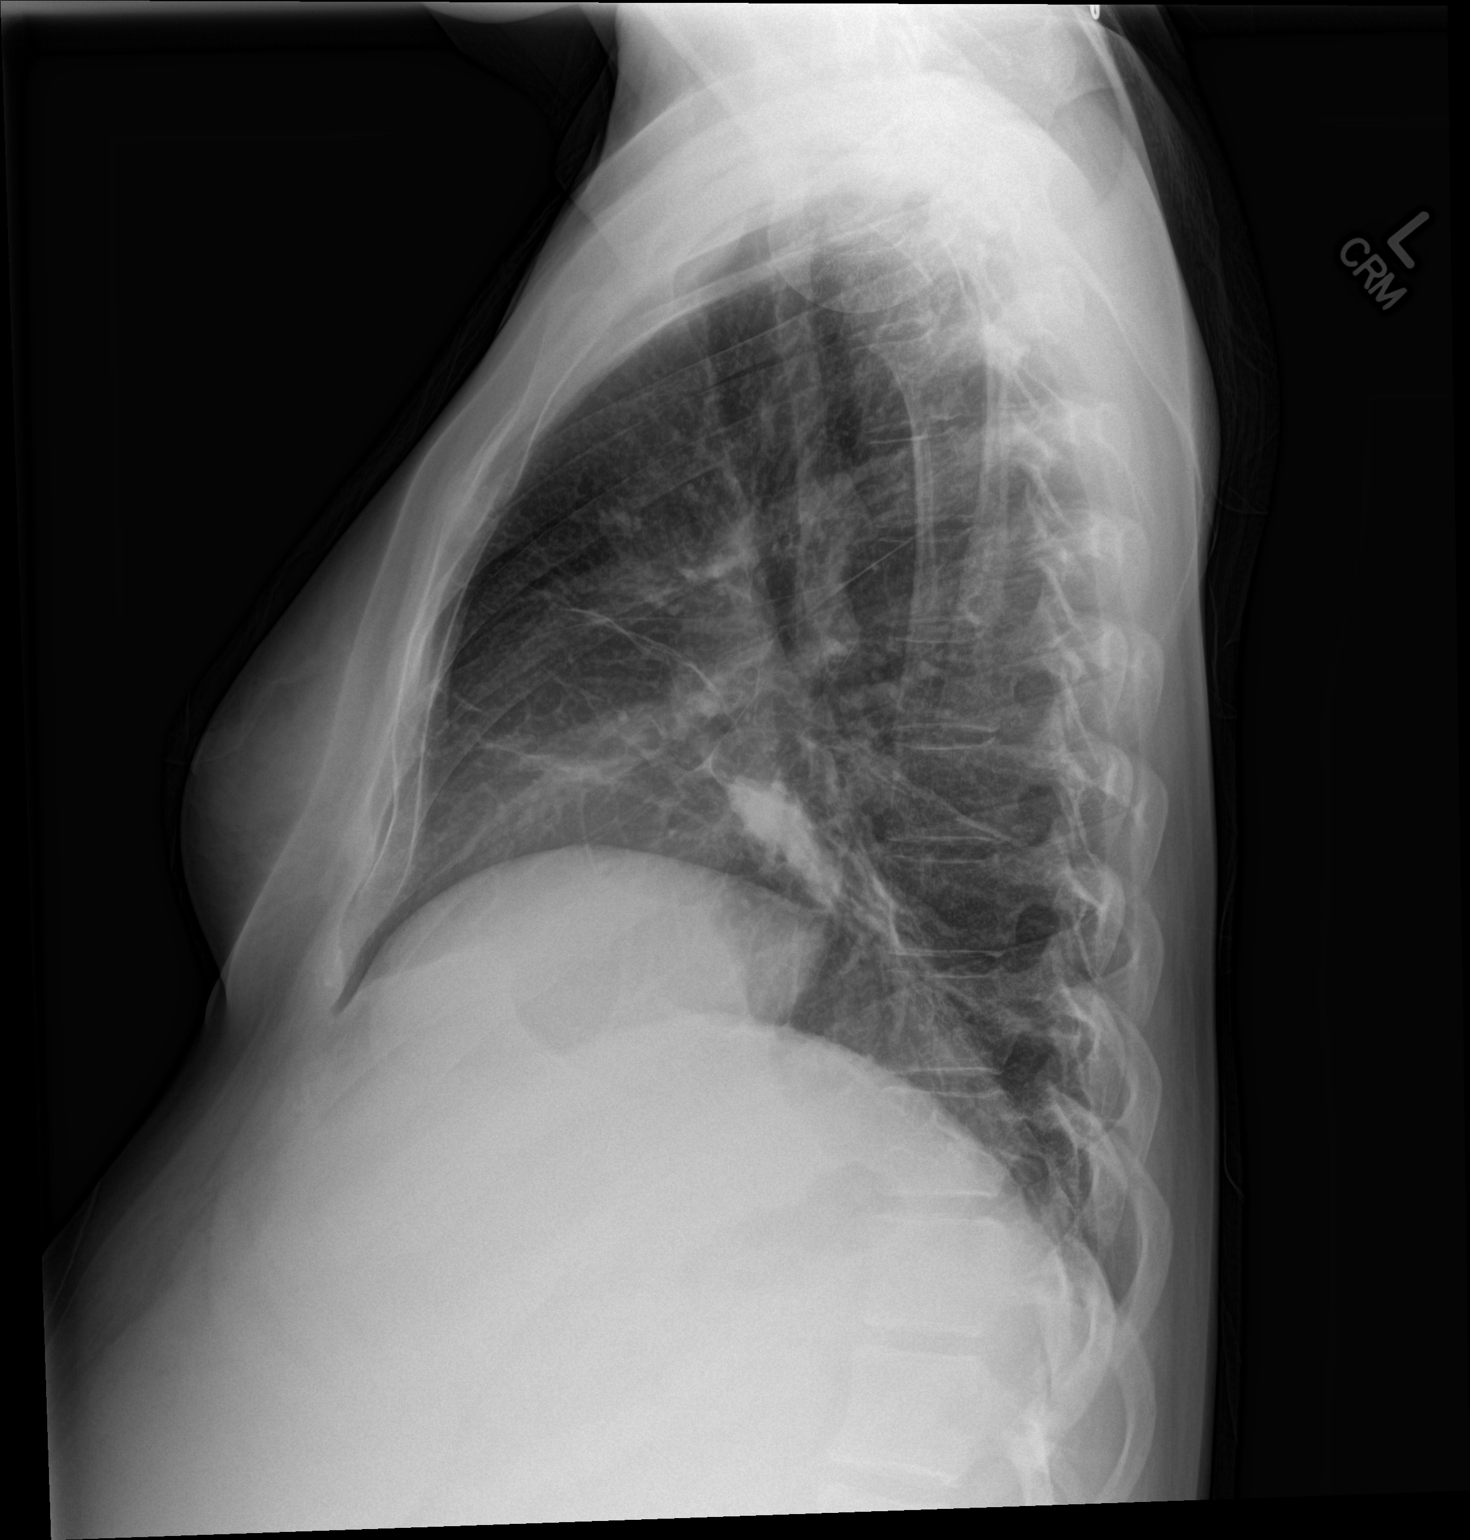

[2 of 2 positions shown; findings below may reference images not displayed]

FINDINGS: Mediastinum and hilar structures are normal. Diffuse bilateral
pulmonary interstitial prominence noted suggesting pneumonitis. No
pleural effusion or pneumothorax. Cardiomegaly cannot be excluded.
No acute bony abnormality .
IMPRESSION: 1. Diffuse bilateral pulmonary interstitial prominence suggesting
pneumonitis.
2. Cardiomegaly with interstitial pulmonary edema cannot be
completely excluded. Follow-up chest x-ray suggested for continued
evaluation.

## 2016-03-07 ENCOUNTER — Ambulatory Visit: Payer: Self-pay

## 2016-03-17 ENCOUNTER — Ambulatory Visit: Payer: Medicaid Other | Attending: Internal Medicine | Admitting: *Deleted

## 2016-03-17 DIAGNOSIS — Z111 Encounter for screening for respiratory tuberculosis: Secondary | ICD-10-CM

## 2016-03-17 NOTE — Progress Notes (Signed)
Patient denies pain at this time.  Patient tolerated injection well today in the right forearm.  [id number] NDC # 28/10/18 EXPIRATION Right forearm LOCATION .0.1ML ADMINISTERED

## 2016-03-17 NOTE — Patient Instructions (Signed)
Please return in 48 hours to have your TB test Read

## 2016-03-19 ENCOUNTER — Ambulatory Visit: Payer: Medicaid Other | Attending: Internal Medicine

## 2016-03-19 LAB — TB SKIN TEST
Induration: 0 mm
TB SKIN TEST: NEGATIVE

## 2016-04-16 ENCOUNTER — Ambulatory Visit: Payer: Self-pay | Attending: Internal Medicine

## 2016-04-16 DIAGNOSIS — Z0000 Encounter for general adult medical examination without abnormal findings: Secondary | ICD-10-CM

## 2016-04-16 DIAGNOSIS — Z23 Encounter for immunization: Secondary | ICD-10-CM

## 2016-05-01 IMAGING — CR DG HAND COMPLETE 3+V*R*
3 series · 3 of 3 positions shown · non-contrast
Comparison: None.

CLINICAL DATA: Injury.  Pain.

EXAM:
RIGHT HAND - COMPLETE 3+ VIEW

[x hand pa right]
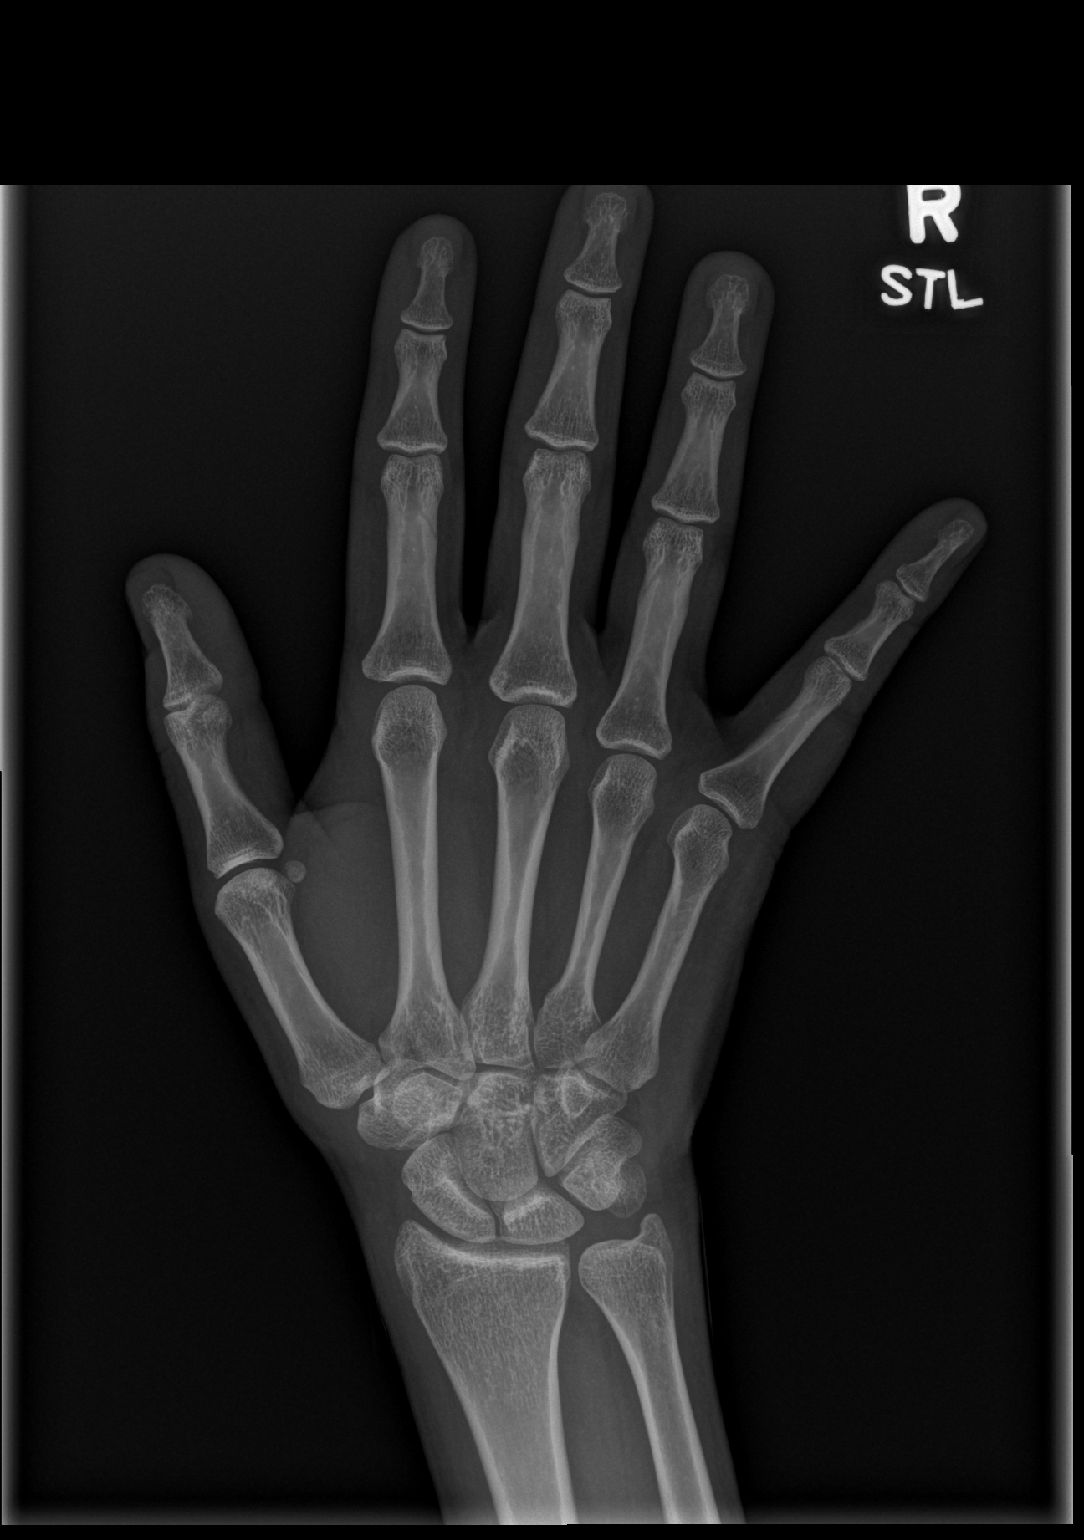

[x hand obl right]
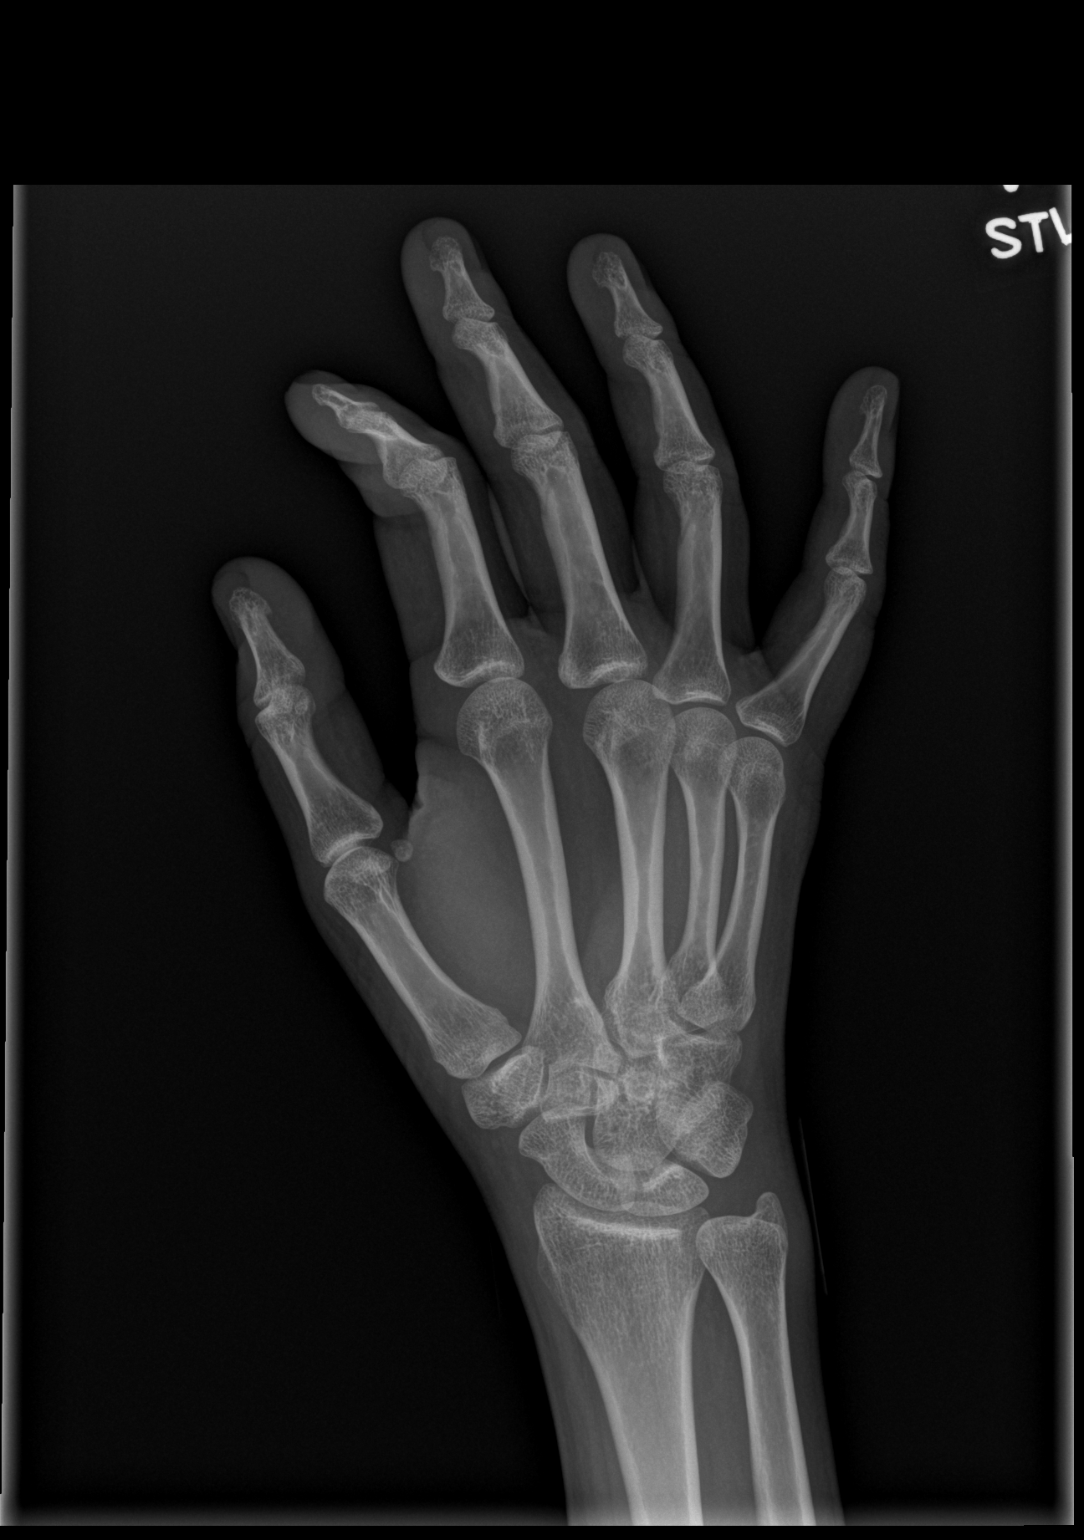

[x hand lat right]
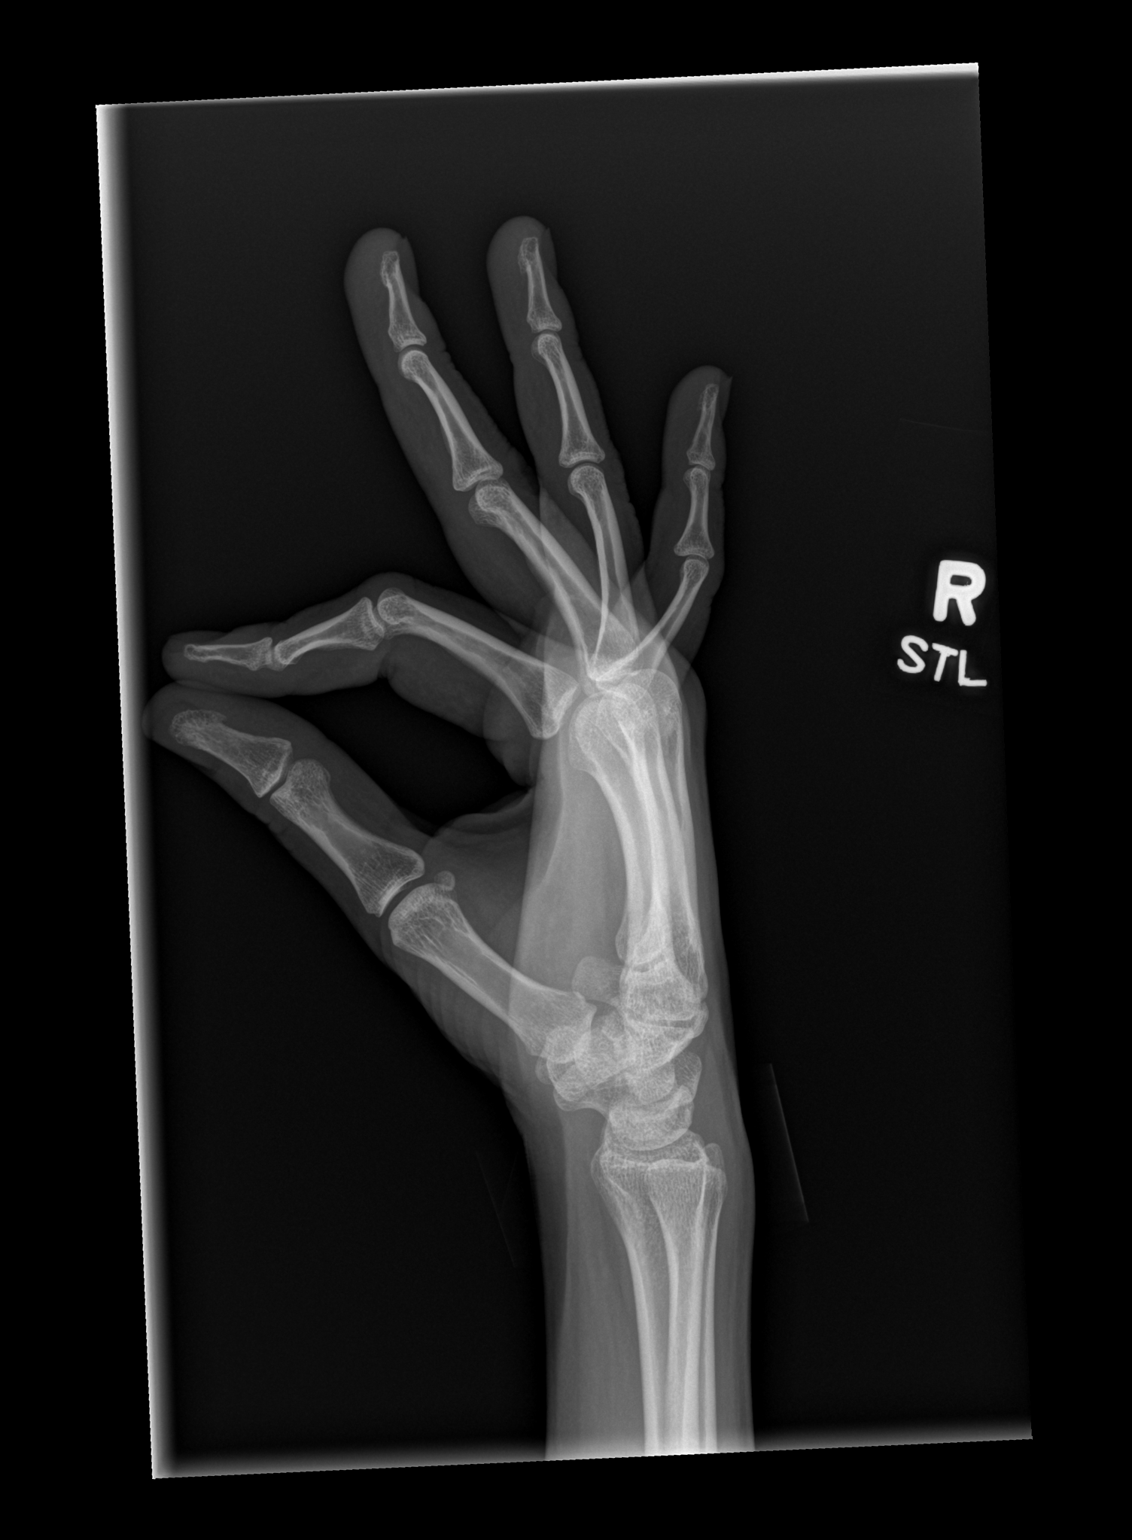

[3 of 3 positions shown; findings below may reference images not displayed]

FINDINGS: There is no evidence of fracture or dislocation. There is no
evidence of arthropathy or other focal bone abnormality. There is
mild soft tissue swelling at the base of the fifth digit. No
radiopaque foreign body.
IMPRESSION: Soft tissue swelling.  No fracture or dislocation.

## 2016-05-13 ENCOUNTER — Encounter: Payer: Self-pay | Admitting: Gastroenterology

## 2016-05-13 ENCOUNTER — Ambulatory Visit (INDEPENDENT_AMBULATORY_CARE_PROVIDER_SITE_OTHER): Payer: Self-pay | Admitting: Gastroenterology

## 2016-05-13 VITALS — BP 104/50 | HR 80 | Ht 65.35 in | Wt 127.1 lb

## 2016-05-13 DIAGNOSIS — R110 Nausea: Secondary | ICD-10-CM

## 2016-05-13 DIAGNOSIS — R6881 Early satiety: Secondary | ICD-10-CM

## 2016-05-13 DIAGNOSIS — K7030 Alcoholic cirrhosis of liver without ascites: Secondary | ICD-10-CM

## 2016-05-13 MED ORDER — SPIRONOLACTONE 50 MG PO TABS: 50.0000 mg | ORAL_TABLET | Freq: Every day | ORAL | 2 refills | Status: DC

## 2016-05-13 MED ORDER — FUROSEMIDE 20 MG PO TABS: 20.0000 mg | ORAL_TABLET | Freq: Every day | ORAL | 2 refills | Status: DC

## 2016-05-13 NOTE — Patient Instructions (Addendum)
Please call us in 2-3 weeks and let us know how you are doing.    Please weigh your self weekly and record the numbers.  Please read the information about the low sodium diet and start a low sodium diet.  If you are age 27 or older, your body mass index should be between 23-30. Your Body mass index is 20.93 kg/m. If this is out of the aforementioned range listed, please consider follow up with your Primary Care Provider.  If you are age 364 or younger, your body mass index should be between 19-25. Your Body mass index is 20.93 kg/m. If this is out of the aformentioned range listed, please consider follow up with your Primary Care Provider.   Thank you for choosing [facility]  Dr Amada JupiterHenry Danis III

## 2016-05-13 NOTE — Progress Notes (Signed)
[facility] Progress Note  Chief Complaint: Abdominal distention and early satiety  Subjective  History:  This is a 25 year old woman in the clinic for the first time since September 2016. At that time she saw Dr. Arlyce DiceKaplan after a recent hospital stay. She had developed hematemesis and was found to have a Mallory-Weiss tear. The upper endoscopy report cannot currently be located, as it is not in the procedures note section of the chart. It may been done as a travel case in the intensive care unit, but I also cannot find it in the operative note section. Therefore, I do not know of any varices were seen. Dr. Arlyce DiceKaplan did not mention esophageal varices and is follow-up note. She was actively drinking at that time, but has been sober for the last 10 months. 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 She was referred to infectious disease clinic, but apparently had cleared the virus and did not need medicines. She has been bothered by several months of increasing abdominal distention which she feels swollen and uncomfortable. She also describes recurrent belching, early satiety and intermittent nausea but no vomiting or weight loss. Unfortunately, she lost her job and has been uninsured for several months as well. Therefore, her last visit with primary care was about 6 months ago. I reviewed that office note and the available labs.  ROS: Cardiovascular:  no chest pain Respiratory: no dyspnea No menses for years Chronic fatigue and constipation The patient's Past Medical, Family and Social History were reviewed and are on file in the EMR.  Objective:  Med list reviewed  Vital signs in last 24 hrs: Vitals:   05/13/16 1352  BP: (!) 104/50  Pulse: 80    Physical Exam She is petite and thin She is pleasant and conversational  HEENT: sclera anicteric, oral mucosa moist without lesions  Neck: supple, no thyromegaly, JVD or lymphadenopathy  Cardiac: RRR  without murmurs, S1S2 heard, no peripheral edema  Pulm: clear to auscultation bilaterally, normal RR and effort noted  Abdomen: soft, mildly distended with ascites, positive shifting dullness, no tenderness, with active bowel sounds. Her spleen is markedly enlarged, and left lobe of the liver is felt about 2 finger breadths below the costal margin on deep inspiration. Skin; warm and dry, no jaundice or rash, + spider nevi on the upper chest wall    Recent Labs:  CMP Latest Ref Rng & Units 11/13/2015 10/09/2015 05/20/2015  Glucose 65 - 99 mg/dL 93 782(N104(H) 562(Z108(H)  BUN 7 - 25 mg/dL 12 8 6   Creatinine 0.50 - 1.10 mg/dL 3.080.55 6.57(Q0.44(L) 4.69(G0.35(L)  Sodium 135 - 146 mmol/L 139 136 137  Potassium 3.5 - 5.3 mmol/L 4.5 4.2 2.9(L)  Chloride 98 - 110 mmol/L 108 105 101  CO2 20 - 31 mmol/L 23 24 25   Calcium 8.6 - 10.2 mg/dL 9.1 9.1 2.9(B8.6(L)  Total Protein 6.1 - 8.1 g/dL 7.2 7.4 2.8(U8.4(H)  Total Bilirubin 0.2 - 1.2 mg/dL 1.3(K1.3(H) 4.4(W1.4(H) 4.5(H)  Alkaline Phos 33 - 115 U/L 110 110 131(H)  AST 10 - 30 U/L 27 37(H) 97(H)  ALT 6 - 29 U/L 11 17 32   CBC    Component Value Date/Time   WBC 3.4 (L) 11/13/2015 1728   RBC 3.78 (L) 11/13/2015 1728   HGB 8.4 (L) 11/13/2015 1728   HCT 26.7 (L) 11/13/2015 1728   PLT 106 (L) 11/13/2015 1728   MCV 70.6 (L) 11/13/2015 1728   MCH 22.2 (L) 11/13/2015  1728   MCHC 31.5 11/13/2015 1728   RDW 20.2 (H) 11/13/2015 1728   LYMPHSABS 1.1 05/20/2015 0119   MONOABS 0.2 05/20/2015 0119   EOSABS 0.0 05/20/2015 0119   BASOSABS 0.0 05/20/2015 0119     @ASSESSMENTPLANBEGIN @ Assessment: Encounter Diagnoses  Name Primary?  . cirrhosis with ascites Yes  . Early satiety   . Nausea without vomiting    She appears to have moderate ascites on exam with no peripheral edema. I think this most likely accounts for her recent symptoms.   Plan:  We discussed the need for sodium restriction I prescribed furosemide 20 mg once daily and spironolactone 50 mg once daily. I started with  low-dose given her age, weight and lack of peripheral edema  Ideally, I would like her to have an upper endoscopy before the end of the year to screen for esophageal varices. We have given her information to apply for charity care through [facility] that she can continue to get care for her chronic medical problems to include endoscopic procedures as well.  Total time 25 minutes, over half spent in counseling and coordination of care.   Charlie Pitter III

## 2016-10-28 ENCOUNTER — Telehealth: Payer: Self-pay | Admitting: Gastroenterology

## 2016-10-28 NOTE — Telephone Encounter (Signed)
Patient states that she has been having intermittent lower abdominal pain x 6 weeks. States her abdominal swelling (ascites) is doing okay. She states she has had no changes in bowel movements. She does report that she is waking up in the middle of the night with a fever of 102.5, 2-3 times/week. When this occurs she takes ibuprofen and when she wakes up in the morning fever is gone. She is still continuing to take Lasix 20 mg and spironolactone 50 mg daily. Please advise.

## 2016-10-29 NOTE — Telephone Encounter (Signed)
Sounds like it would be best to see her in clinic this week.  I have no openings, but do any of the APPs have an appointment?  Also, did she ever apply for reduced cost care through [facility] as advised at last clinic visit?  She was uninsured, making it difficult for her to get tests done.

## 2016-10-30 NOTE — Telephone Encounter (Signed)
Left message for patient yesterday, 3/14 to call back, did not hear back from her. Called patient again today and she is scheduled for office visit on 3/16 with Dr. Myrtie Neitheranis.

## 2016-10-31 ENCOUNTER — Encounter: Payer: Self-pay | Admitting: Gastroenterology

## 2016-10-31 ENCOUNTER — Ambulatory Visit (INDEPENDENT_AMBULATORY_CARE_PROVIDER_SITE_OTHER): Payer: Self-pay | Admitting: Gastroenterology

## 2016-10-31 VITALS — BP 96/50 | HR 76 | Ht 65.35 in | Wt 122.8 lb

## 2016-10-31 DIAGNOSIS — R1032 Left lower quadrant pain: Secondary | ICD-10-CM

## 2016-10-31 DIAGNOSIS — K7030 Alcoholic cirrhosis of liver without ascites: Secondary | ICD-10-CM

## 2016-10-31 DIAGNOSIS — R509 Fever, unspecified: Secondary | ICD-10-CM

## 2016-10-31 MED ORDER — CIPROFLOXACIN HCL 500 MG PO TABS: 500.0000 mg | ORAL_TABLET | Freq: Two times a day (BID) | ORAL | 0 refills | Status: AC

## 2016-10-31 NOTE — Progress Notes (Signed)
     [facility] Progress Note  Chief Complaint: Cirrhosis, abdominal pain, fever  Subjective  History:  Mary Garrison follows up for the first time in about 6 months. She has alcohol-related cirrhosis with small volume ascites, she has been sober well over a year now. She previously tested positive for hepatitis C but cleared the virus and did not need medicines. When I last saw her, I had her on low-dose diuretics and asked her to apply for an [employer] guard to get her chronic care. She admits that she sent the application to the wrong address and did not follow-up on it. She was given a new form today. She has not seen primary care in a year.  Today she is bothered by a few months of intermittent lower abdominal cramps that will happen a few times a week and perhaps last several hours before subsiding. There's been no change in bowel habits. For about the last 2 weeks she has been waking at night feeling feverish and says she has a documented temperature of 102.5. She will then take some Tylenol and wake in the morning with a fever gone.  ROS: Cardiovascular:  no chest pain Respiratory: no dyspnea  The patient's Past Medical, Family and Social History were reviewed and are on file in the EMR.  Objective:  Med list reviewed  Vital signs in last 24 hrs: Vitals:   10/31/16 1620  BP: (!) 96/50  Pulse: 76    Physical Exam  Chronically ill-appearing woman, poor muscle mass as before  HEENT: sclera anicteric, oral mucosa moist without lesions  Neck: supple, no thyromegaly, JVD or lymphadenopathy  Cardiac: RRR without murmurs, S1S2 heard, no peripheral edema  Pulm: clear to auscultation bilaterally, normal RR and effort noted  Abdomen: soft, mild bulging flanks tenderness, with active bowel sounds. Spleen tip is palpable cigarettes below left costal margin, liver is felt on midaxillary line with inspiration.  Skin; warm and dry, no jaundice or rash  Recent Labs:  She has not  had labs in a year   @ASSESSMENTPLANBEGIN @ Assessment: Encounter Diagnoses  Name Primary?  . cirrhosis with ascites Yes  . Fever, unspecified fever cause   . LLQ abdominal pain      She has been taking her low-dose diuretics, and fortunately her ascites is low volume. I do not think we can increase the dose with her low blood pressure. She also does not have peripheral edema.  I do not know what is causing the fevers. She has not used IV drugs in many years, she has no heart murmur to suggest endocarditis. I think it would be strange to have his clinical picture with SBP, but it is certainly the diagnosis we do not want to miss.  She is unable to afford any testing at this point.  Plan:  ciprofloxacin 500 mg twice daily for 5 days Continue low-dose diuretics Gram daily sodium restriction [facility] so we can do routine lab work, imaging and a screening EGD. I'm concerned with her thrombocytopenia and splenomegaly she may have esophageal varices.  Total time 20 minutes, over half spent in counseling and coordination of care.   Charlie PitterHenry L Danis III

## 2016-10-31 NOTE — Patient Instructions (Signed)
If you are age 28 or older, your body mass index should be between 23-30. Your Body mass index is 20.22 kg/m. If this is out of the aforementioned range listed, please consider follow up with your Primary Care Provider.  If you are age 28 or younger, your body mass index should be between 19-25. Your Body mass index is 20.22 kg/m. If this is out of the aformentioned range listed, please consider follow up with your Primary Care Provider.   Thank you for choosing [facility]  Dr Amada JupiterHenry Danis III

## 2016-11-24 ENCOUNTER — Ambulatory Visit: Payer: Self-pay | Attending: Family Medicine | Admitting: Family Medicine

## 2016-11-24 ENCOUNTER — Encounter: Payer: Self-pay | Admitting: Family Medicine

## 2016-11-24 VITALS — BP 111/74 | HR 76 | Temp 98.4°F | Ht 65.35 in | Wt 124.2 lb

## 2016-11-24 DIAGNOSIS — Z79899 Other long term (current) drug therapy: Secondary | ICD-10-CM | POA: Insufficient documentation

## 2016-11-24 DIAGNOSIS — F17210 Nicotine dependence, cigarettes, uncomplicated: Secondary | ICD-10-CM | POA: Insufficient documentation

## 2016-11-24 DIAGNOSIS — K7030 Alcoholic cirrhosis of liver without ascites: Secondary | ICD-10-CM | POA: Insufficient documentation

## 2016-11-24 DIAGNOSIS — J302 Other seasonal allergic rhinitis: Secondary | ICD-10-CM | POA: Insufficient documentation

## 2016-11-24 DIAGNOSIS — R102 Pelvic and perineal pain: Secondary | ICD-10-CM | POA: Insufficient documentation

## 2016-11-24 LAB — POCT URINALYSIS DIPSTICK
BILIRUBIN UA: NEGATIVE
GLUCOSE UA: NEGATIVE
Ketones, UA: NEGATIVE
Leukocytes, UA: NEGATIVE
Nitrite, UA: NEGATIVE
Protein, UA: NEGATIVE
RBC UA: NEGATIVE
SPEC GRAV UA: 1.015 (ref 1.030–1.035)
Urobilinogen, UA: 1 (ref ?–2.0)
pH, UA: 6 (ref 5.0–8.0)

## 2016-11-24 LAB — POCT URINE PREGNANCY: PREG TEST UR: NEGATIVE

## 2016-11-24 MED ORDER — CETIRIZINE HCL 10 MG PO TABS: 10.0000 mg | ORAL_TABLET | Freq: Every day | ORAL | 11 refills | Status: DC

## 2016-11-24 NOTE — Assessment & Plan Note (Signed)
Zyrtec for seasonal allergies

## 2016-11-24 NOTE — Progress Notes (Signed)
Subjective:  Patient ID: Mary Garrison, female    DOB: 02-Oct-1990  Age: 26 y.o. MRN: [id number]  CC: Abdominal Pain and Allergies   HPI Mary Garrison has hx of hep C with spontaneous viral resolution,  alcohol abuse, chronic hepatitis, anorexia,    1. Abdominal pain: bilateral lower abdominal pain with nausea, fever and chills overnight for one month. Her maximum temperature was up to 102 F. She took a 5 day course of ciprofloxacin . She has not had recurrent fever. She still has hot sensation, chills, body aches and nausea at night. She also has bilateral lower abdominal cramping at night.  She reports her ascites has improved over the last 6 months. She has secondary amenorrhea for the past 5 years.   She is in nursing school. She is in [place] and parenting classes. She is supporting herself and her grandmother lives with her. Her son is with his father.   Social History  Substance Use Topics  . Smoking status: Current Every Day Smoker    Packs/day: 0.50    Years: 10.00    Types: Cigarettes  . Smokeless tobacco: Never Used  . Alcohol use No     Comment: Last ETOH Nov 2016    Outpatient Medications Prior to Visit  Medication Sig Dispense Refill  . albuterol (PROVENTIL HFA;VENTOLIN HFA) 108 (90 Base) MCG/ACT inhaler Inhale 2 puffs into the lungs every 6 (six) hours as needed. For shortness of breath 8 g 5  . Diclofenac Sodium 3 % GEL Place 1 application onto the skin as needed (for muscle pain). 100 g 0  . folic acid (FOLVITE) 1 MG tablet Take 1 tablet (1 mg total) by mouth daily. 90 tablet 3  . furosemide (LASIX) 20 MG tablet Take 1 tablet (20 mg total) by mouth daily. 30 tablet 2  . spironolactone (ALDACTONE) 50 MG tablet Take 1 tablet (50 mg total) by mouth daily. 30 tablet 2   No facility-administered medications prior to visit.     ROS Review of Systems  Constitutional: Negative for chills and fever.  HENT: Positive for dental problem.   Eyes: Negative for visual  disturbance.  Respiratory: Negative for shortness of breath.   Cardiovascular: Negative for chest pain and leg swelling.  Gastrointestinal: Positive for abdominal distention and nausea. Negative for abdominal pain, anal bleeding, blood in stool, constipation, diarrhea, rectal pain and vomiting.  Genitourinary: Positive for dysuria.  Musculoskeletal: Negative for arthralgias and back pain.  Skin: Negative for rash.  Allergic/Immunologic: Negative for immunocompromised state.  Hematological: Negative for adenopathy. Does not bruise/bleed easily.  Psychiatric/Behavioral: Negative for dysphoric mood and suicidal ideas.    Objective:  BP 111/74   Pulse 76   Temp 98.4 F (36.9 C) (Oral)   Ht 5' 5.35" (1.66 m)   Wt 124 lb 3.2 oz (56.3 kg)   SpO2 99%   BMI 20.45 kg/m   BP/Weight 11/24/2016 10/31/2016 05/13/2016  Systolic BP 111 96 104  Diastolic BP 74 50 50  Wt. (Lbs) 124.2 122.8 127.13  BMI 20.45 20.22 20.93   Physical Exam  Constitutional: She is oriented to person, place, and time. She appears well-developed and well-nourished. No distress.  HENT:  Head: Normocephalic and atraumatic.  Cardiovascular: Normal rate, regular rhythm, normal heart sounds and intact distal pulses.   Pulmonary/Chest: Effort normal and breath sounds normal.  Musculoskeletal: She exhibits no edema.  Neurological: She is alert and oriented to person, place, and time.  Skin: Skin is warm and dry. No  rash noted.  Psychiatric: She has a normal mood and affect.   UA: negative  U preg: negative Assessment & Plan:   There are no diagnoses linked to this encounter. Mary Garrison was seen today for abdominal pain and allergies.  Diagnoses and all orders for this visit:  Alcoholic cirrhosis of liver without ascites (HCC) -     CBC with Differential -     HIV antibody (with reflex) -     Hepatitis c antibody (reflex) -     CMP and Liver -     Iron and TIBC -     Ferritin -     Folate -     US Abdomen Complete;  Future  Pelvic pain -     POCT urinalysis dipstick -     POCT urine pregnancy -     US Pelvis Complete; Future -     US Transvaginal Non-OB; Future  Acute seasonal allergic rhinitis, unspecified trigger -     cetirizine (ZYRTEC) 10 MG tablet; Take 1 tablet (10 mg total) by mouth daily.   No orders of the defined types were placed in this encounter.   Follow-up: Return in about 6 weeks (around 01/05/2017) for pelvic pains .   Dessa Phi MD

## 2016-11-24 NOTE — Assessment & Plan Note (Signed)
A: pain in lower abdomen and pelvis at night Normal exam Hx of cirrhosis, no ascites  P: Labs per orders  Abdominal ultrasound, pelvic and transvaginal ultrasound

## 2016-11-24 NOTE — Progress Notes (Signed)
Pt was told to make an appointment by gastro doctor for abdominal pain. Pt has also had fever.

## 2016-11-24 NOTE — Patient Instructions (Addendum)
Lanya was seen today for abdominal pain and allergies.  Diagnoses and all orders for this visit:  Alcoholic cirrhosis of liver without ascites (HCC) -     CBC with Differential -     HIV antibody (with reflex) -     Hepatitis c antibody (reflex) -     CMP and Liver -     Iron and TIBC -     Ferritin -     Folate -     US Abdomen Complete; Future  Pelvic pain -     POCT urinalysis dipstick -     POCT urine pregnancy -     US Pelvis Complete; Future -     US Transvaginal Non-OB; Future  Acute seasonal allergic rhinitis, unspecified trigger -     cetirizine (ZYRTEC) 10 MG tablet; Take 1 tablet (10 mg total) by mouth daily.   Keep diet low in salt You will be called with lab results and ultrasound results  f/u in 6 weeks for pelvic pains   Dr. Marshell Levan

## 2016-11-26 LAB — CBC WITH DIFFERENTIAL/PLATELET
BASOS: 0 %
Basophils Absolute: 0 10*3/uL (ref 0.0–0.2)
EOS (ABSOLUTE): 0.1 10*3/uL (ref 0.0–0.4)
EOS: 2 %
HEMOGLOBIN: 11.2 g/dL (ref 11.1–15.9)
Hematocrit: 34.7 % (ref 34.0–46.6)
Immature Grans (Abs): 0 10*3/uL (ref 0.0–0.1)
Immature Granulocytes: 0 %
LYMPHS ABS: 1.3 10*3/uL (ref 0.7–3.1)
Lymphs: 37 %
MCH: 24.5 pg — ABNORMAL LOW (ref 26.6–33.0)
MCHC: 32.3 g/dL (ref 31.5–35.7)
MCV: 76 fL — AB (ref 79–97)
MONOCYTES: 6 %
Monocytes Absolute: 0.2 10*3/uL (ref 0.1–0.9)
NEUTROS ABS: 1.9 10*3/uL (ref 1.4–7.0)
Neutrophils: 55 %
Platelets: 86 10*3/uL — CL (ref 150–379)
RBC: 4.57 x10E6/uL (ref 3.77–5.28)
RDW: 18.1 % — ABNORMAL HIGH (ref 12.3–15.4)
WBC: 3.4 10*3/uL (ref 3.4–10.8)

## 2016-11-26 LAB — CMP AND LIVER
ALBUMIN: 4.2 g/dL (ref 3.5–5.5)
ALT: 17 IU/L (ref 0–32)
AST: 32 IU/L (ref 0–40)
Alkaline Phosphatase: 162 IU/L — ABNORMAL HIGH (ref 39–117)
BUN: 11 mg/dL (ref 6–20)
Bilirubin Total: 0.8 mg/dL (ref 0.0–1.2)
Bilirubin, Direct: 0.33 mg/dL (ref 0.00–0.40)
CALCIUM: 9 mg/dL (ref 8.7–10.2)
CHLORIDE: 99 mmol/L (ref 96–106)
CO2: 22 mmol/L (ref 18–29)
Creatinine, Ser: 0.53 mg/dL — ABNORMAL LOW (ref 0.57–1.00)
GFR, EST AFRICAN AMERICAN: 150 mL/min/{1.73_m2} (ref >59)
GFR, EST NON AFRICAN AMERICAN: 131 mL/min/{1.73_m2} (ref >59)
GLUCOSE: 93 mg/dL (ref 65–99)
POTASSIUM: 4.2 mmol/L (ref 3.5–5.2)
SODIUM: 137 mmol/L (ref 134–144)
Total Protein: 7.7 g/dL (ref 6.0–8.5)

## 2016-11-26 LAB — COMMENT2 - HEP PANEL

## 2016-11-26 LAB — FOLATE: FOLATE: 11.7 ng/mL (ref >3.0)

## 2016-11-26 LAB — HEPATITIS C ANTIBODY (REFLEX): HCV Ab: >11 s/co ratio — ABNORMAL HIGH (ref 0.0–0.9)

## 2016-11-26 LAB — FERRITIN: Ferritin: 24 ng/mL (ref 15–150)

## 2016-11-26 LAB — IRON AND TIBC
Iron Saturation: 10 % — ABNORMAL LOW (ref 15–55)
Iron: 39 ug/dL (ref 27–159)
Total Iron Binding Capacity: 373 ug/dL (ref 250–450)
UIBC: 334 ug/dL (ref 131–425)

## 2016-11-26 LAB — HIV ANTIBODY (ROUTINE TESTING W REFLEX): HIV Screen 4th Generation wRfx: NONREACTIVE

## 2016-11-27 ENCOUNTER — Ambulatory Visit: Payer: Self-pay | Attending: Family Medicine | Admitting: Family Medicine

## 2016-11-27 VITALS — BP 103/66 | HR 97 | Temp 98.3°F | Resp 18 | Ht 65.0 in | Wt 127.0 lb

## 2016-11-27 DIAGNOSIS — G510 Bell's palsy: Secondary | ICD-10-CM | POA: Insufficient documentation

## 2016-11-27 MED ORDER — POLYVINYL ALCOHOL 1.4 % OP SOLN: 1.0000 [drp] | OPHTHALMIC | 3 refills | Status: DC

## 2016-11-27 MED ORDER — VALACYCLOVIR HCL 1 G PO TABS: 1000.0000 mg | ORAL_TABLET | Freq: Three times a day (TID) | ORAL | 0 refills | Status: DC

## 2016-11-27 MED ORDER — PREDNISONE 20 MG PO TABS: 60.0000 mg | ORAL_TABLET | Freq: Every day | ORAL | 0 refills | Status: AC

## 2016-11-27 MED ORDER — ARTIFICIAL TEARS OP OINT: TOPICAL_OINTMENT | Freq: Every day | OPHTHALMIC | 0 refills | Status: DC

## 2016-11-27 NOTE — Patient Instructions (Addendum)
Schedule follow up with PCP in 3 weeks. Bell Palsy Bell palsy is a condition in which the muscles on one side of the face become paralyzed. This often causes one side of the face to droop. It is a common condition and most people recover completely. RISK FACTORS Risk factors for Bell palsy include:  Pregnancy.  Diabetes.  An infection by a virus, such as infections that cause cold sores. CAUSES  Bell palsy is caused by damage to or inflammation of a nerve in your face. It is unclear why this happens, but an infection by a virus may lead to it. Most of the time the reason it happens is unknown. SIGNS AND SYMPTOMS  Symptoms can range from mild to severe and can take place over a number of hours. Symptoms may include:  Being unable to:  Raise one or both eyebrows.  Close one or both eyes.  Feel parts of your face (facial numbness).  Drooping of the eyelid and corner of the mouth.  Weakness in the face.  Paralysis of half your face.  Loss of taste.  Sensitivity to loud noises.  Difficulty chewing.  Tearing up of the affected eye.  Dryness in the affected eye.  Drooling.  Pain behind one ear. DIAGNOSIS  Diagnosis of Bell palsy may include:  A medical history and physical exam.  An MRI.  A CT scan.  Electromyography (EMG). This is a test that checks how your nerves are working. TREATMENT  Treatment may include antiviral medicine to help shorten the length of the condition. Sometimes treatment is not needed and the symptoms go away on their own. HOME CARE INSTRUCTIONS   Take medicines only as directed by your health care provider.  Do facial massages and exercises as directed by your health care provider.  If your eye is affected:  Use moisturizing eye drops to prevent drying of your eye as directed by your health care provider.  Protect your eye as directed by your health care provider. SEEK MEDICAL CARE IF:  Your symptoms do not get better or get  worse.  You are drooling.  Your eye is red, irritated, or hurts. SEEK IMMEDIATE MEDICAL CARE IF:   Another part of your body feels weak or numb.  You have difficulty swallowing.  You have a fever along with symptoms of Bell palsy.  You develop neck pain. MAKE SURE YOU:   Understand these instructions.  Will watch your condition.  Will get help right away if you are not doing well or get worse. This information is not intended to replace advice given to you by your health care provider. Make sure you discuss any questions you have with your health care provider. Document Released: 08/04/2005 Document Revised: 04/25/2015 Document Reviewed: 11/11/2013 Elsevier Interactive Patient Education  2017 [employer].

## 2016-11-27 NOTE — Progress Notes (Signed)
Patient is here because she has some concerns on her right side of the face  She stated that when she is talking  Or smiling something its pulling on the right side  Patient notice that when she smile the right side wont stretch to smile

## 2016-11-27 NOTE — Progress Notes (Signed)
Subjective:  Patient ID: Mary Garrison, female    DOB: June 16, 1991  Age: 26 y.o. MRN: [id number]  CC: Establish Care   HPI Mary Garrison presents for   Face concerns: Symptoms began Saturday. Symptoms include difficulty being able to close right eye shut ans right sided facial droop. Feels pulling sensation of face, blurring of vision to right eye, and frontal headache for two days. Denies any N/V, severe pain, or redness of eye. Reports taking OTC ibuprofen for pain. Two days ago reported difficulty being able to swallow. Denies any difficulty breathing, SOB, or swelling of the throat or tongue. Denies any arm weakness, recent history of illness, or difficulty with speech.   Outpatient Medications Prior to Visit  Medication Sig Dispense Refill  . albuterol (PROVENTIL HFA;VENTOLIN HFA) 108 (90 Base) MCG/ACT inhaler Inhale 2 puffs into the lungs every 6 (six) hours as needed. For shortness of breath 8 g 5  . cetirizine (ZYRTEC) 10 MG tablet Take 1 tablet (10 mg total) by mouth daily. 30 tablet 11  . Diclofenac Sodium 3 % GEL Place 1 application onto the skin as needed (for muscle pain). 100 g 0  . folic acid (FOLVITE) 1 MG tablet Take 1 tablet (1 mg total) by mouth daily. 90 tablet 3  . furosemide (LASIX) 20 MG tablet Take 1 tablet (20 mg total) by mouth daily. 30 tablet 2  . spironolactone (ALDACTONE) 50 MG tablet Take 1 tablet (50 mg total) by mouth daily. 30 tablet 2   No facility-administered medications prior to visit.     ROS Review of Systems  Eyes:       Inability to close right eyelid.   Respiratory: Negative.   Cardiovascular: Negative.   Gastrointestinal: Negative.   Neurological: Positive for weakness (facial).   Objective:  BP 103/66 (BP Location: Left Arm, Patient Position: Sitting, Cuff Size: Normal)   Pulse 97   Temp 98.3 F (36.8 C) (Oral)   Resp 18   Ht  (1.651 m)   Wt 127 lb (57.6 kg)   SpO2 97%   BMI 21.13 kg/m   BP/Weight 11/27/2016 11/24/2016  10/31/2016  Systolic BP 103 111 96  Diastolic BP 66 74 50  Wt. (Lbs) 127 124.2 122.8  BMI 21.13 20.45 20.22    Physical Exam  HENT:  Head:    Right Ear: External ear normal.  Left Ear: External ear normal.  Nose: Nose normal.  Mouth/Throat: Oropharynx is clear and moist.  Deficit to cranial nerve V & VII. Reports decreased sensation to right side of face with touch.  Eyes: Conjunctivae and EOM are normal. Pupils are equal, round, and reactive to light.       Assessment & Plan:   Problem List Items Addressed This Visit    None    Visit Diagnoses    Bell's palsy    -  Primary   -Prescription script for eye patch given.    Relevant Medications   predniSONE (DELTASONE) 20 MG tablet   valACYclovir (VALTREX) 1000 MG tablet   artificial tears (LACRILUBE) OINT ophthalmic ointment   polyvinyl alcohol (ARTIFICIAL TEARS) 1.4 % ophthalmic solution      Meds ordered this encounter  Medications  . predniSONE (DELTASONE) 20 MG tablet    Sig: Take 3 tablets (60 mg total) by mouth daily with breakfast.    Dispense:  21 tablet    Refill:  0    Order Specific Question:   Supervising Provider    Answer:  JEGEDE, OLUGBEMIGA E [id number]  . valACYclovir (VALTREX) 1000 MG tablet    Sig: Take 1 tablet (1,000 mg total) by mouth 3 (three) times daily.    Dispense:  21 tablet    Refill:  0    Order Specific Question:   Supervising Provider    Answer:   Quentin Angst [id number]  . artificial tears (LACRILUBE) OINT ophthalmic ointment    Sig: Place into the right eye at bedtime.    Dispense:  3.5 g    Refill:  0    Order Specific Question:   Supervising Provider    Answer:   Quentin Angst [id number]  . polyvinyl alcohol (ARTIFICIAL TEARS) 1.4 % ophthalmic solution    Sig: Place 1 drop into the right eye every hour while awake.    Dispense:  15 mL    Refill:  3    Order Specific Question:   Supervising Provider    Answer:   Quentin Angst [[id number]]    Follow-up:  Return in about 3 weeks (around 12/18/2016), or if symptoms worsen or fail to improve, for Bell's palsy .   Lizbeth Bark FNP

## 2016-12-01 ENCOUNTER — Ambulatory Visit (HOSPITAL_COMMUNITY)
Admission: RE | Admit: 2016-12-01 | Discharge: 2016-12-01 | Disposition: A | Discharge status: Home / Self Care | Payer: Self-pay | Source: Ambulatory Visit | Attending: Family Medicine | Admitting: Family Medicine

## 2016-12-01 DIAGNOSIS — K8020 Calculus of gallbladder without cholecystitis without obstruction: Secondary | ICD-10-CM | POA: Insufficient documentation

## 2016-12-01 DIAGNOSIS — K7030 Alcoholic cirrhosis of liver without ascites: Secondary | ICD-10-CM | POA: Insufficient documentation

## 2016-12-01 DIAGNOSIS — R102 Pelvic and perineal pain: Secondary | ICD-10-CM

## 2016-12-01 DIAGNOSIS — R161 Splenomegaly, not elsewhere classified: Secondary | ICD-10-CM | POA: Insufficient documentation

## 2016-12-05 ENCOUNTER — Telehealth: Payer: Self-pay

## 2016-12-05 NOTE — Telephone Encounter (Signed)
Pt was called and informed of all lab results and ultrasound results.

## 2016-12-29 ENCOUNTER — Emergency Department (HOSPITAL_COMMUNITY)
Admission: EM | Admit: 2016-12-29 | Discharge: 2016-12-29 | Disposition: A | Discharge status: Home / Self Care | Payer: Self-pay | Attending: Dermatology | Admitting: Dermatology

## 2016-12-29 ENCOUNTER — Encounter (HOSPITAL_COMMUNITY): Payer: Self-pay | Admitting: Emergency Medicine

## 2016-12-29 ENCOUNTER — Emergency Department (HOSPITAL_COMMUNITY)
Admission: EM | Admit: 2016-12-29 | Discharge: 2016-12-30 | Disposition: A | Discharge status: Home / Self Care | Payer: Self-pay | Attending: Emergency Medicine | Admitting: Emergency Medicine

## 2016-12-29 ENCOUNTER — Encounter (HOSPITAL_COMMUNITY): Payer: Self-pay | Admitting: *Deleted

## 2016-12-29 DIAGNOSIS — J45909 Unspecified asthma, uncomplicated: Secondary | ICD-10-CM | POA: Insufficient documentation

## 2016-12-29 DIAGNOSIS — K469 Unspecified abdominal hernia without obstruction or gangrene: Secondary | ICD-10-CM | POA: Insufficient documentation

## 2016-12-29 DIAGNOSIS — Z5321 Procedure and treatment not carried out due to patient leaving prior to being seen by health care provider: Secondary | ICD-10-CM | POA: Insufficient documentation

## 2016-12-29 DIAGNOSIS — K429 Umbilical hernia without obstruction or gangrene: Secondary | ICD-10-CM

## 2016-12-29 DIAGNOSIS — F17210 Nicotine dependence, cigarettes, uncomplicated: Secondary | ICD-10-CM | POA: Insufficient documentation

## 2016-12-29 DIAGNOSIS — K420 Umbilical hernia with obstruction, without gangrene: Secondary | ICD-10-CM | POA: Insufficient documentation

## 2016-12-29 DIAGNOSIS — Z79899 Other long term (current) drug therapy: Secondary | ICD-10-CM | POA: Insufficient documentation

## 2016-12-29 NOTE — ED Triage Notes (Signed)
Pt to ED c/o hernia underneath umbilicus starting last night - states it popped out during a coughing fit. Pt endorses non-productive cough x 3 weeks, which has been getting stronger, and caused the hernia to pop out. She denies fevers or generalized abdominal pain - pain is only where the hernia is, tender to touch.

## 2016-12-29 NOTE — ED Provider Notes (Signed)
[facility]-EMERG[facility]Y DEPT Provider Note   CSN: [id number] Arrival date & time: 12/29/16  2313 By signing my name below, I, Levon Hedger, attest that this documentation has been prepared under the direction and in the presence of Dione Booze, MD . Electronically Signed: Levon Hedger, Scribe. 12/29/2016. 12:00 AM   History   Chief Complaint Chief Complaint  Patient presents with  . Abdominal Pain    HPI Mary Garrison is a 26 y.o. female with a history of C. Difficile, alcoholic cirrhosis, and polysubstance abuse who presents to the Emergency Department complaining of gradual onset, constant pain to her umbilicus onset tonight. Pt states that she had a coughing spell last night, and noticed that her abdomen was sore. Tonight, she brushed against her umbilicus and felt sharp, shooting pain. She currently rates her pain 6/10 in severity. Per pt, pain is exacerbated by direct palpation, and rates it 10/10 with palpation. She reports associated nausea secondary to pain. No OTC treatments tried for these symptoms PTA. Pt denies any alcohol use. She denies any vomiting and has no other acute complaints at this time.  The history is provided by the patient. No language interpreter was used.    Past Medical History:  Diagnosis Date  . Alcohol addiction (HCC)   . Asthma    as a child  . C. difficile diarrhea 08/24/2014  . Cirrhosis, alcoholic (HCC)   . Common migraine with intractable migraine 05/03/2015  . Hemorrhoids   . Hepatitis C 11/2012   she suspects this was from a tattoo as she was dxd with Hep C before starting her IVDA.   Marland Kitchen Hypokalemia 06/26/2014  . Hypophosphatemia 06/29/2014   Phos of 1.4 > 4.0 with supplementation    . Mallory-Weiss tear   . Polysubstance dependence (HCC)    including IV heroin. inpt rehab 01/2012.   . Tachycardia     Patient Active Problem List   Diagnosis Date Noted  . Pelvic pain 11/24/2016  . Acute seasonal allergic rhinitis 11/24/2016  . Pancytopenia (HCC)  11/16/2015  . Chronic nausea 10/09/2015  . Alcohol use disorder, severe, dependence (HCC) 05/20/2015  . Common migraine with intractable migraine 05/03/2015  . Insomnia 04/17/2015  . Chronic headache 04/17/2015  . Acute blood loss anemia   . Mallory-Weiss tear 04/07/2015  . 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 Headache 03/06/2015  . Malnutrition of moderate degree (HCC) 02/17/2015  . History of acute alcoholic hepatitis 02/17/2015  . Acute renal failure syndrome (HCC)   . Muscle pain 12/27/2014  . Chronic anemia 12/18/2014  . Hypokalemia, inadequate intake 12/18/2014  . GERD (gastroesophageal reflux disease) 10/09/2014  . Alcohol abuse, in remission 08/20/2014  . Tobacco abuse 08/20/2014  . Vitamin D insufficiency 07/26/2014  . Anemia 06/26/2014  . Depression with anxiety 06/21/2014  . Asthma   . History of type C viral hepatitis 11/16/2013    Past Surgical History:  Procedure Laterality Date  . ESOPHAGOGASTRODUODENOSCOPY N/A 04/07/2015   Procedure: ESOPHAGOGASTRODUODENOSCOPY (EGD);  Surgeon: Louis Meckel, MD;  Location: [facility];  Service: Gastroenterology;  Laterality: N/A;  . ESOPHAGUS SURGERY    . mallory weiss      OB History    Gravida Para Term Preterm AB Living   1             SAB TAB Ectopic Multiple Live Births                   Home Medications  Prior to Admission medications   Medication Sig Start Date End Date Taking? Authorizing Provider  albuterol (PROVENTIL HFA;VENTOLIN HFA) 108 (90 Base) MCG/ACT inhaler Inhale 2 puffs into the lungs every 6 (six) hours as needed. For shortness of breath 10/09/15   Dessa Phi, MD  artificial tears (LACRILUBE) OINT ophthalmic ointment Place into the right eye at bedtime. 11/27/16   Lizbeth Bark, FNP  cetirizine (ZYRTEC) 10 MG tablet Take 1 tablet (10 mg total) by mouth daily. 11/24/16   Funches, Gerilyn Nestle, MD  Diclofenac Sodium 3 % GEL Place 1 application onto the skin as needed (for muscle  pain). 10/09/15   Funches, Gerilyn Nestle, MD  folic acid (FOLVITE) 1 MG tablet Take 1 tablet (1 mg total) by mouth daily. 11/16/15   Funches, Gerilyn Nestle, MD  furosemide (LASIX) 20 MG tablet Take 1 tablet (20 mg total) by mouth daily. 05/13/16   Sherrilyn Rist, MD  polyvinyl alcohol (ARTIFICIAL TEARS) 1.4 % ophthalmic solution Place 1 drop into the right eye every hour while awake. 11/27/16   Lizbeth Bark, FNP  spironolactone (ALDACTONE) 50 MG tablet Take 1 tablet (50 mg total) by mouth daily. 05/13/16   Sherrilyn Rist, MD  valACYclovir (VALTREX) 1000 MG tablet Take 1 tablet (1,000 mg total) by mouth 3 (three) times daily. 11/27/16   Lizbeth Bark, FNP    Family History Family History  Problem Relation Age of Onset  . Asthma Mother   . Migraines Mother   . Depression Brother   . Other Brother   . Migraines Brother   . Depression Sister   . Other Sister   . Migraines Sister   . Heart disease Maternal Grandmother   . Migraines Maternal Grandmother   . Heart disease Father   . Migraines Brother   . Liver cancer Maternal Grandfather   . Pancreatic cancer Maternal Grandfather   . Lung cancer Maternal Grandfather     Social History Social History  Substance Use Topics  . Smoking status: Current Every Day Smoker    Packs/day: 0.50    Years: 10.00    Types: Cigarettes  . Smokeless tobacco: Never Used  . Alcohol use No     Comment: Last ETOH Nov 2016     Allergies   Penicillins and Sulfa antibiotics   Review of Systems Review of Systems  Constitutional: Negative for fever.  Gastrointestinal: Positive for abdominal pain and nausea. Negative for vomiting.  All other systems reviewed and are negative.  Physical Exam Updated Vital Signs BP 117/75 (BP Location: Right Arm)   Pulse 80   Temp 99 F (37.2 C) (Oral)   Resp 16   Ht 5\' 5"  (1.651 m)   Wt 125 lb (56.7 kg)   SpO2 97%   BMI 20.80 kg/m   Physical Exam  Constitutional: She is oriented to person, place,  and time. She appears well-developed and well-nourished.  HENT:  Head: Normocephalic and atraumatic.  Eyes: EOM are normal. Pupils are equal, round, and reactive to light.  Neck: Normal range of motion. Neck supple. No JVD present.  Cardiovascular: Normal rate, regular rhythm and normal heart sounds.   No murmur heard. Pulmonary/Chest: Effort normal and breath sounds normal. She has no wheezes. She has no rales. She exhibits no tenderness.  Abdominal: Soft. Bowel sounds are normal. She exhibits no distension and no mass. There is no tenderness. A hernia is present.  Small umbilical hernia present to the superior aspect of the umbilicus  Musculoskeletal: Normal range of motion. She exhibits no edema.  Lymphadenopathy:    She has no cervical adenopathy.  Neurological: She is alert and oriented to person, place, and time. No cranial nerve deficit. She exhibits normal muscle tone. Coordination normal.  Skin: Skin is warm and dry. No rash noted.  Psychiatric: She has a normal mood and affect. Her behavior is normal. Judgment and thought content normal.  Nursing note and vitals reviewed.  ED Treatments / Results  DIAGNOSTIC STUDIES:  Oxygen Saturation is 97% on RA, normal by my interpretation.    COORDINATION OF CARE:  12:00 AM Discussed treatment plan with pt at bedside and pt agreed to plan.    Procedures Procedures (including critical care time) Umbilical hernia was reduced with direct pressure. This was successful. Small hernia defect it was noted following reduction. Patient had significant improvement in pain with successful reduction. She tolerated procedure well.  Medications Ordered in ED Medications - No data to display   Initial Impression / Assessment and Plan / ED Course  I have reviewed the triage vital signs and the nursing notes.  Small umbilical hernia which was successfully reduced in the ED. She is referred to general surgery for follow-up. Old records are  reviewed, and she does have history of alcoholic cirrhosis with ascites. Importance of minimizing ascites was stressed the patient. She states she is not drinking currently.  Final Clinical Impressions(s) / ED Diagnoses   Final diagnoses:  Umbilical hernia without obstruction and without gangrene    New Prescriptions New Prescriptions   No medications on file   I personally performed the services described in this documentation, which was scribed in my presence. The recorded information has been reviewed and is accurate.       Dione BoozeGlick, Jnyah Brazee, MD 12/30/16 [phone]

## 2016-12-29 NOTE — ED Triage Notes (Signed)
Pt c/o possible hernia to umbilical area that popped up yesterday

## 2016-12-31 ENCOUNTER — Encounter: Payer: Self-pay | Admitting: Family Medicine

## 2017-05-10 ENCOUNTER — Ambulatory Visit (HOSPITAL_COMMUNITY)
Admission: EM | Admit: 2017-05-10 | Discharge: 2017-05-10 | Disposition: A | Discharge status: Home / Self Care | Payer: Self-pay

## 2017-05-10 DIAGNOSIS — K047 Periapical abscess without sinus: Secondary | ICD-10-CM

## 2017-05-10 MED ORDER — CLINDAMYCIN HCL 300 MG PO CAPS: 300.0000 mg | ORAL_CAPSULE | Freq: Three times a day (TID) | ORAL | 0 refills | Status: AC

## 2017-05-10 NOTE — Discharge Instructions (Signed)
Anticipate gradual improvement in pain and swelling over the next several days.  Followup with the dentist to assess potential dental source of infection.  Note for work today.

## 2017-05-10 NOTE — ED Triage Notes (Signed)
Right jaw swelling and pain that was noticed on Thursday evening.  Some pain in right sid of neck and right ear sometimes.  Denies teeth hurting

## 2017-05-27 ENCOUNTER — Ambulatory Visit: Payer: Self-pay | Attending: Family Medicine | Admitting: Family Medicine

## 2017-05-27 ENCOUNTER — Encounter: Payer: Self-pay | Admitting: Family Medicine

## 2017-05-27 VITALS — BP 108/71 | HR 84 | Temp 98.3°F | Resp 18 | Ht 65.0 in | Wt 116.6 lb

## 2017-05-27 DIAGNOSIS — E876 Hypokalemia: Secondary | ICD-10-CM | POA: Insufficient documentation

## 2017-05-27 DIAGNOSIS — Z87898 Personal history of other specified conditions: Secondary | ICD-10-CM

## 2017-05-27 DIAGNOSIS — Z8639 Personal history of other endocrine, nutritional and metabolic disease: Secondary | ICD-10-CM

## 2017-05-27 DIAGNOSIS — F1010 Alcohol abuse, uncomplicated: Secondary | ICD-10-CM | POA: Insufficient documentation

## 2017-05-27 DIAGNOSIS — J4520 Mild intermittent asthma, uncomplicated: Secondary | ICD-10-CM | POA: Insufficient documentation

## 2017-05-27 DIAGNOSIS — F1011 Alcohol abuse, in remission: Secondary | ICD-10-CM

## 2017-05-27 DIAGNOSIS — Z79899 Other long term (current) drug therapy: Secondary | ICD-10-CM | POA: Insufficient documentation

## 2017-05-27 MED ORDER — ALBUTEROL SULFATE HFA 108 (90 BASE) MCG/ACT IN AERS: 2.0000 | INHALATION_SPRAY | Freq: Four times a day (QID) | RESPIRATORY_TRACT | 3 refills | Status: DC | PRN

## 2017-05-27 MED ORDER — CETIRIZINE HCL 10 MG PO TABS: 10.0000 mg | ORAL_TABLET | Freq: Every day | ORAL | 11 refills | Status: DC

## 2017-05-27 NOTE — Progress Notes (Signed)
Patient is here for f/up paper sing  by pcp

## 2017-05-27 NOTE — Progress Notes (Signed)
Subjective:  Patient ID: Mary Garrison, female    DOB: May 13, 1991  Age: 26 y.o. MRN: [id number]  CC: Establish Care   HPI Mary Garrison presents to reestablish care. PMH of asthma, ETOH abuse. She brings paper with her regarding iginition interlocking device. Patient requests provider note for reduction of capacity from standard 1.5 L to 1.2 L due to having difficulty blowing air into device for prolonged period of time . History of asthma. She denies any  dyspnea, non-productive cough and wheezing. She reports occasional use of albuterol inhaler. She reports last time she used her short-acting inhaler was during the summer. Patient has not required any recent Emergency Room treatment for these symptoms, and has not required hospitalization.        Outpatient Medications Prior to Visit  Medication Sig Dispense Refill  . artificial tears (LACRILUBE) OINT ophthalmic ointment Place into the right eye at bedtime. 3.5 g 0  . Diclofenac Sodium 3 % GEL Place 1 application onto the skin as needed (for muscle pain). 100 g 0  . folic acid (FOLVITE) 1 MG tablet Take 1 tablet (1 mg total) by mouth daily. 90 tablet 3  . ibuprofen (ADVIL,MOTRIN) 200 MG tablet Take 200 mg by mouth every 6 (six) hours as needed.    . polyvinyl alcohol (ARTIFICIAL TEARS) 1.4 % ophthalmic solution Place 1 drop into the right eye every hour while awake. 15 mL 3  . spironolactone (ALDACTONE) 50 MG tablet Take 1 tablet (50 mg total) by mouth daily. 30 tablet 2  . valACYclovir (VALTREX) 1000 MG tablet Take 1 tablet (1,000 mg total) by mouth 3 (three) times daily. 21 tablet 0  . albuterol (PROVENTIL HFA;VENTOLIN HFA) 108 (90 Base) MCG/ACT inhaler Inhale 2 puffs into the lungs every 6 (six) hours as needed. For shortness of breath 8 g 5  . cetirizine (ZYRTEC) 10 MG tablet Take 1 tablet (10 mg total) by mouth daily. 30 tablet 11  . furosemide (LASIX) 20 MG tablet Take 1 tablet (20 mg total) by mouth daily. 30 tablet 2   No  facility-administered medications prior to visit.     ROS Review of Systems  Constitutional: Negative.   Respiratory: Negative.   Cardiovascular: Negative.   Psychiatric/Behavioral: Positive for behavioral problems (History of EtOH abuse). Negative for suicidal ideas.        Objective:  BP 108/71 (BP Location: Left Arm, Patient Position: Sitting, Cuff Size: Normal)   Pulse 84   Temp 98.3 F (36.8 C) (Oral)   Resp 18   Ht  (1.651 m)   Wt 116 lb 9.6 oz (52.9 kg)   SpO2 93%   BMI 19.40 kg/m   BP/Weight 05/27/2017 05/10/2017 12/29/2016  Systolic BP 108 111 117  Diastolic BP 71 72 75  Wt. (Lbs) 116.6 - 125  BMI 19.4 - 20.8     Physical Exam  Constitutional: She appears well-developed and well-nourished.  Cardiovascular: Normal rate, regular rhythm, normal heart sounds and intact distal pulses.   Pulmonary/Chest: Effort normal and breath sounds normal. No respiratory distress. She has no wheezes.  Psychiatric: She expresses no homicidal and no suicidal ideation. She expresses no suicidal plans and no homicidal plans.  Nursing note and vitals reviewed.    Assessment & Plan:   1. Mild intermittent asthma without complication Asthma stable on current medication regimen. Note provided. - albuterol (PROVENTIL HFA;VENTOLIN HFA) 108 (90 Base) MCG/ACT inhaler; Inhale 2 puffs into the lungs every 6 (six) hours as needed for wheezing  or shortness of breath. For shortness of breath  Dispense: 8 g; Refill: 3 - cetirizine (ZYRTEC) 10 MG tablet; Take 1 tablet (10 mg total) by mouth at bedtime.  Dispense: 30 tablet; Refill: 11  2. History of alcohol abuse  - CBC  3. History of hypokalemia  - Basic Metabolic Panel   Meds ordered this encounter  Medications  . albuterol (PROVENTIL HFA;VENTOLIN HFA) 108 (90 Base) MCG/ACT inhaler    Sig: Inhale 2 puffs into the lungs every 6 (six) hours as needed for wheezing or shortness of breath. For shortness of breath    Dispense:  8 g     Refill:  3    Order Specific Question:   Supervising Provider    Answer:   Quentin Angst [id number]  . cetirizine (ZYRTEC) 10 MG tablet    Sig: Take 1 tablet (10 mg total) by mouth at bedtime.    Dispense:  30 tablet    Refill:  11    Order Specific Question:   Supervising Provider    Answer:   Quentin Angst [[id number]]    Follow-up: Return in about 3 months (around 08/27/2017) for Asthma.   Lizbeth Bark FNP

## 2017-05-27 NOTE — Patient Instructions (Signed)
Asthma Attack Prevention, Adult Although you may not be able to control the fact that you have asthma, you can take actions to prevent episodes of asthma (asthma attacks). These actions include:  Creating a written plan for managing and treating your asthma attacks (asthma action plan).  Monitoring your asthma.  Avoiding things that can irritate your airways or make your asthma symptoms worse (asthma triggers).  Taking your medicines as directed.  Acting quickly if you have signs or symptoms of an asthma attack. What are some ways to prevent an asthma attack? Create a plan Work with your health care provider to create an asthma action plan. This plan should include:  A list of your asthma triggers and how to avoid them.  A list of symptoms that you experience during an asthma attack.  Information about when to take medicine and how much medicine to take.  Information to help you understand your peak flow measurements.  Contact information for your health care providers.  Daily actions that you can take to control asthma. Monitor your asthma   To monitor your asthma:  Use your peak flow meter every morning and every evening for 2-3 weeks. Record the results in a journal. A drop in your peak flow numbers on one or more days may mean that you are starting to have an asthma attack, even if you are not having symptoms.  When you have asthma symptoms, write them down in a journal. Avoid asthma triggers   Work with your health care provider to find out what your asthma triggers are. This can be done by:  Being tested for allergies.  Keeping a journal that notes when asthma attacks occur and what may have contributed to them.  Asking your health care provider whether other medical conditions make your asthma worse. Common asthma triggers include:  Dust.  Smoke. This includes campfire smoke and secondhand smoke from tobacco products.  Pet dander.  Trees, grasses or  pollens.  Very cold, dry, or humid air.  Mold.  Foods that contain high amounts of sulfites.  Strong smells.  Engine exhaust and air pollution.  Aerosol sprays and fumes from household cleaners.  Household pests and their droppings, including dust mites and cockroaches.  Certain medicines, including NSAIDs. Once you have determined your asthma triggers, take steps to avoid them. Depending on your triggers, you may be able to reduce the chance of an asthma attack by:  Keeping your home clean. Have someone dust and vacuum your home for you 1 or 2 times a week. If possible, have them use a high-efficiency particulate arrestance (HEPA) vacuum.  Washing your sheets weekly in hot water.  Using allergy-proof mattress covers and casings on your bed.  Keeping pets out of your home.  Taking care of mold and water problems in your home.  Avoiding areas where people smoke.  Avoiding using strong perfumes or odor sprays.  Avoid spending a lot of time outdoors when pollen counts are high and on very windy days.  Talking with your health care provider before stopping or starting any new medicines. Medicines Take over-the-counter and prescription medicines only as told by your health care provider. Many asthma attacks can be prevented by carefully following your medicine schedule. Taking your medicines correctly is especially important when you cannot avoid certain asthma triggers. Even if you are doing well, do not stop taking your medicine and do not take less medicine. Act quickly If an asthma attack happens, acting quickly can decrease how severe   it is and how long it lasts. Take these actions:  Pay attention to your symptoms. If you are coughing, wheezing, or having difficulty breathing, do not wait to see if your symptoms go away on their own. Follow your asthma action plan.  If you have followed your asthma action plan and your symptoms are not improving, call your health care  provider or seek immediate medical care at the nearest hospital. It is important to write down how often you need to use your fast-acting rescue inhaler. You can track how often you use an inhaler in your journal. If you are using your rescue inhaler more often, it may mean that your asthma is not under control. Adjusting your asthma treatment plan may help you to prevent future asthma attacks and help you to gain better control of your condition. How can I prevent an asthma attack when I exercise?   Exercise is a common asthma trigger. To prevent asthma attacks during exercise:  Follow advice from your health care provider about whether you should use your fast-acting inhaler before exercising. Many people with asthma experience exercise-induced bronchoconstriction (EIB). This condition often worsens during vigorous exercise in cold, humid, or dry environments. Usually, people with EIB can stay very active by using a fast-acting inhaler before exercising.  Avoid exercising outdoors in very cold or humid weather.  Avoid exercising outdoors when pollen counts are high.  Warm up and cool down when exercising.  Stop exercising right away if asthma symptoms start. Consider taking part in exercises that are less likely to cause asthma symptoms such as:  Indoor swimming.  Biking.  Walking.  Hiking.  Playing football. This information is not intended to replace advice given to you by your health care provider. Make sure you discuss any questions you have with your health care provider. Document Released: 07/23/2009 Document Revised: 04/04/2016 Document Reviewed: 01/19/2016 Elsevier Interactive Patient Education  2017 [employer]  

## 2017-05-28 LAB — CBC
Hematocrit: 35.6 % (ref 34.0–46.6)
Hemoglobin: 11.2 g/dL (ref 11.1–15.9)
MCH: 24.8 pg — ABNORMAL LOW (ref 26.6–33.0)
MCHC: 31.5 g/dL (ref 31.5–35.7)
MCV: 79 fL (ref 79–97)
PLATELETS: 103 10*3/uL — AB (ref 150–379)
RBC: 4.52 x10E6/uL (ref 3.77–5.28)
RDW: 18 % — AB (ref 12.3–15.4)
WBC: 4.5 10*3/uL (ref 3.4–10.8)

## 2017-05-28 LAB — BASIC METABOLIC PANEL
BUN / CREAT RATIO: 16 (ref 9–23)
BUN: 11 mg/dL (ref 6–20)
CHLORIDE: 102 mmol/L (ref 96–106)
CO2: 25 mmol/L (ref 20–29)
Calcium: 9.5 mg/dL (ref 8.7–10.2)
Creatinine, Ser: 0.69 mg/dL (ref 0.57–1.00)
GFR calc Af Amer: 137 mL/min/{1.73_m2} (ref >59)
GFR calc non Af Amer: 119 mL/min/{1.73_m2} (ref >59)
GLUCOSE: 88 mg/dL (ref 65–99)
Potassium: 4.2 mmol/L (ref 3.5–5.2)
SODIUM: 140 mmol/L (ref 134–144)

## 2017-06-16 ENCOUNTER — Ambulatory Visit (HOSPITAL_COMMUNITY)
Admission: EM | Admit: 2017-06-16 | Discharge: 2017-06-16 | Disposition: A | Discharge status: Home / Self Care | Payer: Self-pay | Attending: Family Medicine | Admitting: Family Medicine

## 2017-06-16 ENCOUNTER — Encounter (HOSPITAL_COMMUNITY): Payer: Self-pay | Admitting: Emergency Medicine

## 2017-06-16 DIAGNOSIS — B349 Viral infection, unspecified: Secondary | ICD-10-CM

## 2017-06-16 DIAGNOSIS — R509 Fever, unspecified: Secondary | ICD-10-CM

## 2017-06-16 NOTE — ED Triage Notes (Signed)
Pt c/o fever since Saturday night. Fevers 102.5. Can break the fever with tylenol and ibuprofen but it comes back.

## 2017-06-16 NOTE — ED Provider Notes (Signed)
[facility]    CSN: [id number] Arrival date & time: 06/16/17  1524     History   Chief Complaint Chief Complaint  Patient presents with  . Fever    HPI Mary Garrison is a 26 y.o. female.   26 year-old female, with previous history of hepatitis C that did not require medications, hypokalemia, previous alcohol addiction, migraine, presenting today complaining of fever - she states that she has had fever with tmax of 102 intermittently over the past two days. She states that she does have mild headache but this is only when she has a fever. She states that her grandmother recently had similar symptoms, including fever and no other symptoms, that lasted 3-5 days and resolved on it's own. Patient states that she feels well currently and has no other symptoms. Denies current headache, neck pain or stiffness, cough, nasal congestion, sore throat, abdominal pain, nausea, vomiting, rash, skin infection, dysuria   The history is provided by the patient.  Fever  Max temp prior to arrival:  102 Temp source:  Oral Severity:  Moderate Onset quality:  Gradual Duration:  2 days Timing:  Intermittent Progression:  Unchanged Chronicity:  New Relieved by:  Nothing Worsened by:  Nothing Ineffective treatments:  None tried Associated symptoms: headaches (only with fever, resolves after tylenol/motrin)   Associated symptoms: no chest pain, no chills, no confusion, no congestion, no cough, no diarrhea, no dysuria, no ear pain, no myalgias, no nausea, no rash, no rhinorrhea, no somnolence, no sore throat and no vomiting   Risk factors: no contaminated food, no contaminated water, no hx of cancer, no immunosuppression, no occupational exposure and no recent sickness     Past Medical History:  Diagnosis Date  . Alcohol addiction (HCC)   . Asthma    as a child  . C. difficile diarrhea 08/24/2014  . Cirrhosis, alcoholic (HCC)   . Common migraine with intractable migraine 05/03/2015  .  Hemorrhoids   . Hepatitis C 11/2012   she suspects this was from a tattoo as she was dxd with Hep C before starting her IVDA.   Marland Kitchen Hypokalemia 06/26/2014  . Hypophosphatemia 06/29/2014   Phos of 1.4 > 4.0 with supplementation    . Mallory-Weiss tear   . Polysubstance dependence (HCC)    including IV heroin. inpt rehab 01/2012.   . Tachycardia     Patient Active Problem List   Diagnosis Date Noted  . Pelvic pain 11/24/2016  . Acute seasonal allergic rhinitis 11/24/2016  . Pancytopenia (HCC) 11/16/2015  . Chronic nausea 10/09/2015  . Alcohol use disorder, severe, dependence (HCC) 05/20/2015  . Common migraine with intractable migraine 05/03/2015  . Insomnia 04/17/2015  . Chronic headache 04/17/2015  . Acute blood loss anemia   . Mallory-Weiss tear 04/07/2015  . 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 Headache 03/06/2015  . Malnutrition of moderate degree (HCC) 02/17/2015  . History of acute alcoholic hepatitis 02/17/2015  . Acute renal failure syndrome (HCC)   . Muscle pain 12/27/2014  . Chronic anemia 12/18/2014  . Hypokalemia, inadequate intake 12/18/2014  . GERD (gastroesophageal reflux disease) 10/09/2014  . Alcohol abuse, in remission 08/20/2014  . Tobacco abuse 08/20/2014  . Vitamin D insufficiency 07/26/2014  . Anemia 06/26/2014  . Depression with anxiety 06/21/2014  . Asthma   . History of type C viral hepatitis 11/16/2013    Past Surgical History:  Procedure Laterality Date  . ESOPHAGOGASTRODUODENOSCOPY N/A 04/07/2015   Procedure: ESOPHAGOGASTRODUODENOSCOPY (EGD);  Surgeon: Louis Meckel, MD;  Location: [facility];  Service: Gastroenterology;  Laterality: N/A;  . ESOPHAGUS SURGERY    . mallory weiss      OB History    Gravida Para Term Preterm AB Living   1             SAB TAB Ectopic Multiple Live Births                   Home Medications    Prior to Admission medications   Medication Sig Start Date End Date Taking? Authorizing Provider    folic acid (FOLVITE) 1 MG tablet Take 1 tablet (1 mg total) by mouth daily. 11/16/15  Yes Funches, Josalyn, MD  ibuprofen (ADVIL,MOTRIN) 200 MG tablet Take 200 mg by mouth every 6 (six) hours as needed.   Yes [provider]  spironolactone (ALDACTONE) 50 MG tablet Take 1 tablet (50 mg total) by mouth daily. 05/13/16  Yes Danis, Andreas Blower, MD  albuterol (PROVENTIL HFA;VENTOLIN HFA) 108 (90 Base) MCG/ACT inhaler Inhale 2 puffs into the lungs every 6 (six) hours as needed for wheezing or shortness of breath. For shortness of breath 05/27/17   Lizbeth Bark, FNP  artificial tears (LACRILUBE) OINT ophthalmic ointment Place into the right eye at bedtime. 11/27/16   Lizbeth Bark, FNP  cetirizine (ZYRTEC) 10 MG tablet Take 1 tablet (10 mg total) by mouth at bedtime. 05/27/17   Lizbeth Bark, FNP  Diclofenac Sodium 3 % GEL Place 1 application onto the skin as needed (for muscle pain). 10/09/15   Funches, Gerilyn Nestle, MD  polyvinyl alcohol (ARTIFICIAL TEARS) 1.4 % ophthalmic solution Place 1 drop into the right eye every hour while awake. 11/27/16   Lizbeth Bark, FNP  valACYclovir (VALTREX) 1000 MG tablet Take 1 tablet (1,000 mg total) by mouth 3 (three) times daily. 11/27/16   Lizbeth Bark, FNP    Family History Family History  Problem Relation Age of Onset  . Asthma Mother   . Migraines Mother   . Depression Brother   . Other Brother   . Migraines Brother   . Depression Sister   . Other Sister   . Migraines Sister   . Heart disease Maternal Grandmother   . Migraines Maternal Grandmother   . Heart disease Father   . Migraines Brother   . Liver cancer Maternal Grandfather   . Pancreatic cancer Maternal Grandfather   . Lung cancer Maternal Grandfather     Social History Social History  Substance Use Topics  . Smoking status: Current Every Day Smoker    Packs/day: 0.50    Years: 10.00    Types: Cigarettes  . Smokeless tobacco: Never Used  . Alcohol  use No     Comment: Last ETOH Nov 2016     Allergies   Penicillins and Sulfa antibiotics   Review of Systems Review of Systems  Constitutional: Positive for fever. Negative for chills.  HENT: Negative for congestion, ear pain, rhinorrhea and sore throat.   Eyes: Negative for pain and visual disturbance.  Respiratory: Negative for cough and shortness of breath.   Cardiovascular: Negative for chest pain and palpitations.  Gastrointestinal: Negative for abdominal pain, diarrhea, nausea and vomiting.  Genitourinary: Negative for dysuria and hematuria.  Musculoskeletal: Negative for arthralgias, back pain, myalgias, neck pain and neck stiffness.  Skin: Negative for color change and rash.  Neurological: Positive for headaches (only with fever, resolves after tylenol/motrin). Negative for seizures and syncope.  Psychiatric/Behavioral: Negative for confusion.  All other systems reviewed and are negative.    Physical Exam Triage Vital Signs ED Triage Vitals  Enc Vitals Group     BP 06/16/17 1539 111/66     Pulse Rate 06/16/17 1539 92     Resp 06/16/17 1539 16     Temp 06/16/17 1539 99.5 F (37.5 C)     Temp Source 06/16/17 1539 Oral     SpO2 06/16/17 1539 99 %     Weight 06/16/17 1540 120 lb (54.4 kg)     Height 06/16/17 1540 5\' 5"  (1.651 m)     Head Circumference --      Peak Flow --      Pain Score 06/16/17 1542 0     Pain Loc --      Pain Edu? --      Excl. in GC? --    No data found.   Updated Vital Signs BP 111/66 (BP Location: Right Arm)   Pulse 92   Temp 99.5 F (37.5 C) (Oral)   Resp 16   Ht 5\' 5"  (1.651 m)   Wt 120 lb (54.4 kg)   SpO2 99%   BMI 19.97 kg/m   Visual Acuity Right Eye Distance:   Left Eye Distance:   Bilateral Distance:    Right Eye Near:   Left Eye Near:    Bilateral Near:     Physical Exam  Constitutional: She appears well-developed and well-nourished. No distress.  HENT:  Head: Normocephalic and atraumatic.  Right Ear: Hearing,  tympanic membrane, external ear and ear canal normal.  Left Ear: Hearing, tympanic membrane, external ear and ear canal normal.  Nose: Nose normal.  Mouth/Throat: Uvula is midline, oropharynx is clear and moist and mucous membranes are normal.  Eyes: Conjunctivae are normal.  Neck: Neck supple.  Cardiovascular: Normal rate and regular rhythm.   No murmur heard. Pulmonary/Chest: Effort normal and breath sounds normal. No respiratory distress. She has no decreased breath sounds. She has no wheezes. She has no rhonchi. She has no rales.  Abdominal: Soft. There is no tenderness.  Musculoskeletal: She exhibits no edema.  Neurological: She is alert.  Skin: Skin is warm and dry.  Psychiatric: She has a normal mood and affect.  Nursing note and vitals reviewed.    UC Treatments / Results  Labs (all labs ordered are listed, but only abnormal results are displayed) Labs Reviewed - No data to display  EKG  EKG Interpretation None       Radiology No results found.  Procedures Procedures (including critical care time)  Medications Ordered in UC Medications - No data to display   Initial Impression / Assessment and Plan / UC Course  I have reviewed the triage vital signs and the nursing notes.  Pertinent labs & imaging results that were available during my care of the patient were reviewed by me and considered in my medical decision making (see chart for details).     Fever and no other symptoms (aside from headache, only when fever is elevated.) grandmother with same symptoms recently. Patient would like to continue using tylenol/motrin for fever and is requesting work note. She was given return precautions. Discussed meningitis with the patient - no headache currently, no meningeal signs.   Final Clinical Impressions(s) / UC Diagnoses   Final diagnoses:  Fever, unspecified  Viral syndrome    New Prescriptions New Prescriptions   No medications on file     Controlled  Substance  Prescriptions [place] Controlled Substance Registry consulted? Not Applicable   Alecia LemmingBlue, Aariyah Sampey C, [place]PA-C 06/16/17 1606

## 2017-06-19 ENCOUNTER — Telehealth: Payer: Self-pay

## 2017-06-19 NOTE — Telephone Encounter (Signed)
CMA call regarding lab results   Patient did not answer but left a detailed message  & if have any questions just to call back   

## 2017-06-19 NOTE — Telephone Encounter (Signed)
-----   Message from Lizbeth BarkMandesia R Hairston, FNP sent at 06/16/2017  5:01 PM EDT ----- Potassium level is normal. Platelet count is slightly decreased but stable from previous labs.

## 2017-07-21 ENCOUNTER — Ambulatory Visit (HOSPITAL_COMMUNITY)
Admission: EM | Admit: 2017-07-21 | Discharge: 2017-07-21 | Disposition: A | Discharge status: Home / Self Care | Payer: Self-pay | Attending: Internal Medicine | Admitting: Internal Medicine

## 2017-07-21 ENCOUNTER — Encounter (HOSPITAL_COMMUNITY): Payer: Self-pay | Admitting: Family Medicine

## 2017-07-21 DIAGNOSIS — R509 Fever, unspecified: Secondary | ICD-10-CM

## 2017-07-21 MED ORDER — ACETAMINOPHEN 325 MG PO TABS
650.0000 mg | ORAL_TABLET | Freq: Once | ORAL | Status: AC
  Administered 2017-07-21: 650 mg via ORAL

## 2017-07-21 MED ORDER — ACETAMINOPHEN 325 MG PO TABS
ORAL_TABLET | ORAL | Status: AC
  Filled 2017-07-21: qty 2

## 2017-07-21 NOTE — ED Triage Notes (Signed)
Pt here for fever. reports that she has the same thing about 6 weeks ago and now it has returned. Denies any associated symptoms. Reports taking ibuprofen and tylenol. Last dose around 1 pm.

## 2017-07-21 NOTE — Discharge Instructions (Signed)
Tylenol and/or ibuprofen as needed for pain or fevers.  Push fluids to ensure adequate hydration. Continue to monitor for further symptoms.  Follow up with PCP in the next 1-2 weeks as may need further evaluation of unexplained fevers.

## 2017-07-21 NOTE — ED Provider Notes (Signed)
[facility]    CSN: [id number] Arrival date & time: 07/21/17  1714     History   Chief Complaint Chief Complaint  Patient presents with  . Fever    HPI Mary Garrison is a 26 y.o. female.   Mary Garrison presents with complaints of fever which started last night. Denies any other symptoms. States felt well today but feels temp starting to rise again tonight. Denies headache, neck pain, uri symptoms, gi/gu symptoms, dizziness, vaginal symptoms, pain. States currently feels chilled. Similar episode which resolved without treatment 10/30. Has taken ibuprofen and tylenol for symptoms, ibuprofen last at 1300 today. 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 Without rash.    ROS per HPI.       Past Medical History:  Diagnosis Date  . Alcohol addiction (HCC)   . Asthma    as a child  . C. difficile diarrhea 08/24/2014  . Cirrhosis, alcoholic (HCC)   . Common migraine with intractable migraine 05/03/2015  . Hemorrhoids   . Hepatitis C 11/2012   she suspects this was from a tattoo as she was dxd with Hep C before starting her IVDA.   Marland Kitchen. Hypokalemia 06/26/2014  . Hypophosphatemia 06/29/2014   Phos of 1.4 > 4.0 with supplementation    . Mallory-Weiss tear   . Polysubstance dependence (HCC)    including IV heroin. inpt rehab 01/2012.   . Tachycardia     Patient Active Problem List   Diagnosis Date Noted  . Pelvic pain 11/24/2016  . Acute seasonal allergic rhinitis 11/24/2016  . Pancytopenia (HCC) 11/16/2015  . Chronic nausea 10/09/2015  . Alcohol use disorder, severe, dependence (HCC) 05/20/2015  . Common migraine with intractable migraine 05/03/2015  . Insomnia 04/17/2015  . Chronic headache 04/17/2015  . Acute blood loss anemia   . Mallory-Weiss tear 04/07/2015  . 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 Headache 03/06/2015  . Malnutrition of moderate degree (HCC) 02/17/2015  . History of acute alcoholic hepatitis  02/17/2015  . Acute renal failure syndrome (HCC)   . Muscle pain 12/27/2014  . Chronic anemia 12/18/2014  . Hypokalemia, inadequate intake 12/18/2014  . GERD (gastroesophageal reflux disease) 10/09/2014  . Alcohol abuse, in remission 08/20/2014  . Tobacco abuse 08/20/2014  . Vitamin D insufficiency 07/26/2014  . Anemia 06/26/2014  . Depression with anxiety 06/21/2014  . Asthma   . History of type C viral hepatitis 11/16/2013    Past Surgical History:  Procedure Laterality Date  . ESOPHAGOGASTRODUODENOSCOPY N/A 04/07/2015   Procedure: ESOPHAGOGASTRODUODENOSCOPY (EGD);  Surgeon: Louis Meckelobert D Kaplan, MD;  Location: [facility];  Service: Gastroenterology;  Laterality: N/A;  . ESOPHAGUS SURGERY    . mallory weiss      OB History    Gravida Para Term Preterm AB Living   1             SAB TAB Ectopic Multiple Live Births                   Home Medications    Prior to Admission medications   Medication Sig Start Date End Date Taking? Authorizing Provider  albuterol (PROVENTIL HFA;VENTOLIN HFA) 108 (90 Base) MCG/ACT inhaler Inhale 2 puffs into the lungs every 6 (six) hours as needed for wheezing or shortness of breath. For shortness of breath 05/27/17   Lizbeth BarkHairston, Mandesia R, FNP  artificial tears (LACRILUBE) OINT ophthalmic ointment Place into the right eye at bedtime. 11/27/16   Hairston,  Oren Beckmann, FNP  cetirizine (ZYRTEC) 10 MG tablet Take 1 tablet (10 mg total) by mouth at bedtime. 05/27/17   Lizbeth Bark, FNP  Diclofenac Sodium 3 % GEL Place 1 application onto the skin as needed (for muscle pain). 10/09/15   Funches, Gerilyn Nestle, MD  folic acid (FOLVITE) 1 MG tablet Take 1 tablet (1 mg total) by mouth daily. 11/16/15   Funches, Gerilyn Nestle, MD  ibuprofen (ADVIL,MOTRIN) 200 MG tablet Take 200 mg by mouth every 6 (six) hours as needed.    [provider]  polyvinyl alcohol (ARTIFICIAL TEARS) 1.4 % ophthalmic solution Place 1 drop into the right eye every hour while awake. 11/27/16    Lizbeth Bark, FNP  spironolactone (ALDACTONE) 50 MG tablet Take 1 tablet (50 mg total) by mouth daily. 05/13/16   Sherrilyn Rist, MD  valACYclovir (VALTREX) 1000 MG tablet Take 1 tablet (1,000 mg total) by mouth 3 (three) times daily. 11/27/16   Lizbeth Bark, FNP    Family History Family History  Problem Relation Age of Onset  . Asthma Mother   . Migraines Mother   . Depression Brother   . Other Brother   . Migraines Brother   . Depression Sister   . Other Sister   . Migraines Sister   . Heart disease Maternal Grandmother   . Migraines Maternal Grandmother   . Heart disease Father   . Migraines Brother   . Liver cancer Maternal Grandfather   . Pancreatic cancer Maternal Grandfather   . Lung cancer Maternal Grandfather     Social History Social History   Tobacco Use  . Smoking status: Current Every Day Smoker    Packs/day: 0.50    Years: 10.00    Pack years: 5.00    Types: Cigarettes  . Smokeless tobacco: Never Used  Substance Use Topics  . Alcohol use: No    Alcohol/week: 0.0 oz    Comment: Last ETOH Nov 2016  . Drug use: No    Comment: none in 2.5 years   was opiates     Allergies   Penicillins and Sulfa antibiotics   Review of Systems Review of Systems   Physical Exam Triage Vital Signs ED Triage Vitals [07/21/17 1759]  Enc Vitals Group     BP 118/60     Pulse Rate 93     Resp 18     Temp (!) 100.7 F (38.2 C)     Temp src      SpO2 100 %     Weight      Height      Head Circumference      Peak Flow      Pain Score      Pain Loc      Pain Edu?      Excl. in GC?    No data found.  Updated Vital Signs BP 118/60   Pulse 93   Temp (!) 100.7 F (38.2 C)   Resp 18   SpO2 100%   Visual Acuity Right Eye Distance:   Left Eye Distance:   Bilateral Distance:    Right Eye Near:   Left Eye Near:    Bilateral Near:     Physical Exam  Constitutional: She is oriented to person, place, and time. She appears well-developed  and well-nourished. No distress.  HENT:  Head: Normocephalic.  Right Ear: External ear normal.  Left Ear: External ear normal.  Nose: Nose normal.  Mouth/Throat: Oropharynx is clear  and moist.  Eyes: Conjunctivae and EOM are normal. Pupils are equal, round, and reactive to light.  Neck: Normal range of motion. Neck supple.  Cardiovascular: Normal rate and regular rhythm.  Pulmonary/Chest: Effort normal and breath sounds normal. No respiratory distress. She has no wheezes.  Abdominal: Soft. There is no tenderness.  Musculoskeletal: Normal range of motion.  Lymphadenopathy:    She has no cervical adenopathy.  Neurological: She is alert and oriented to person, place, and time. No cranial nerve deficit or sensory deficit. Coordination normal.  Skin: Skin is warm and dry. No rash noted.  Vitals reviewed.    UC Treatments / Results  Labs (all labs ordered are listed, but only abnormal results are displayed) Labs Reviewed - No data to display  EKG  EKG Interpretation None       Radiology No results found.  Procedures Procedures (including critical care time)  Medications Ordered in UC Medications  acetaminophen (TYLENOL) tablet 650 mg (650 mg Oral Given 07/21/17 1833)     Initial Impression / Assessment and Plan / UC Course  I have reviewed the triage vital signs and the nursing notes.  Pertinent labs & imaging results that were available during my care of the patient were reviewed by me and considered in my medical decision making (see chart for details).     Patient without complaints. Benign physical exam at this time. Continue to monitor for symptoms of illness, Return precautions provided. Recommended following up with pcp in the next few weeks for recheck and potential further evaluation for unexplained fevers. Tylenol and/or ibuprofen as needed for pain or fevers. Patient verbalized understanding and agreeable to plan.    Final Clinical Impressions(s) / UC  Diagnoses   Final diagnoses:  Fever, unspecified    ED Discharge Orders    None       Controlled Substance Prescriptions [place] Controlled Substance Registry consulted? Not Applicable   Georgetta HaberBurky, Ruthann Angulo B, NP 07/21/17 [phone]

## 2017-07-30 ENCOUNTER — Ambulatory Visit: Payer: Self-pay | Attending: Family Medicine | Admitting: Family Medicine

## 2017-07-30 ENCOUNTER — Ambulatory Visit (HOSPITAL_COMMUNITY)
Admission: EM | Admit: 2017-07-30 | Discharge: 2017-07-30 | Disposition: A | Discharge status: Home / Self Care | Payer: Self-pay

## 2017-07-30 ENCOUNTER — Encounter: Payer: Self-pay | Admitting: Family Medicine

## 2017-07-30 ENCOUNTER — Encounter (HOSPITAL_COMMUNITY): Payer: Self-pay | Admitting: Family Medicine

## 2017-07-30 VITALS — BP 105/69 | HR 98 | Temp 99.0°F | Resp 18 | Ht 65.0 in | Wt 118.0 lb

## 2017-07-30 DIAGNOSIS — J069 Acute upper respiratory infection, unspecified: Secondary | ICD-10-CM | POA: Insufficient documentation

## 2017-07-30 DIAGNOSIS — R509 Fever, unspecified: Secondary | ICD-10-CM

## 2017-07-30 DIAGNOSIS — B001 Herpesviral vesicular dermatitis: Secondary | ICD-10-CM | POA: Insufficient documentation

## 2017-07-30 DIAGNOSIS — Z79899 Other long term (current) drug therapy: Secondary | ICD-10-CM | POA: Insufficient documentation

## 2017-07-30 MED ORDER — AZITHROMYCIN 250 MG PO TABS: ORAL_TABLET | ORAL | 0 refills | Status: DC

## 2017-07-30 MED ORDER — GUAIFENESIN-DM 100-10 MG/5ML PO SYRP: 5.0000 mL | ORAL_SOLUTION | ORAL | 0 refills | Status: DC | PRN

## 2017-07-30 MED ORDER — ACYCLOVIR 400 MG PO TABS: 400.0000 mg | ORAL_TABLET | Freq: Three times a day (TID) | ORAL | 0 refills | Status: DC

## 2017-07-30 MED ORDER — ACETAMINOPHEN 325 MG PO TABS: 650.0000 mg | ORAL_TABLET | ORAL | 0 refills | Status: DC | PRN

## 2017-07-30 NOTE — Patient Instructions (Signed)
Fever, Adult A fever is an increase in the body's temperature. It is often defined as a temperature of 100 F (38C) or higher. Short mild or moderate fevers often have no long-term effects. They also often do not need treatment. Moderate or high fevers may make you feel uncomfortable. Sometimes, they can also be a sign of a serious illness or disease. The sweating that may happen with repeated fevers or fevers that last a while may also cause you to not have enough fluid in your body (dehydration). You can take your temperature with a thermometer to see if you have a fever. A measured temperature can change with:  Age.  Time of day.  Where the thermometer is placed: ? Mouth (oral). ? Rectum (rectal). ? Ear (tympanic). ? Underarm (axillary). ? Forehead (temporal).  Follow these instructions at home: Pay attention to any changes in your symptoms. Take these actions to help with your condition:  Take over-the-counter and prescription medicines only as told by your doctor. Follow the dosing instructions carefully.  If you were prescribed an antibiotic medicine, take it as told by your doctor. Do not stop taking the antibiotic even if you start to feel better.  Rest as needed.  Drink enough fluid to keep your pee (urine) clear or pale yellow.  Sponge yourself or bathe with room-temperature water as needed. This helps to lower your body temperature . Do not use ice water.  Do not wear too many blankets or heavy clothes.  Contact a doctor if:  You throw up (vomit).  You cannot eat or drink without throwing up.  You have watery poop (diarrhea).  It hurts when you pee.  Your symptoms do not get better with treatment.  You have new symptoms.  You feel very weak. Get help right away if:  You are short of breath or have trouble breathing.  You are dizzy or you pass out (faint).  You feel confused.  You have signs of not having enough fluid in your body, such as: ? A dry  mouth. ? Peeing less. ? Looking pale.  You have very bad pain in your belly (abdomen).  You keep throwing up or having water poop.  You have a skin rash.  Your symptoms suddenly get worse. This information is not intended to replace advice given to you by your health care provider. Make sure you discuss any questions you have with your health care provider. Document Released: 05/13/2008 Document Revised: 01/10/2016 Document Reviewed: 09/28/2014 Elsevier Interactive Patient Education  2018 [employer]  

## 2017-07-30 NOTE — ED Triage Notes (Addendum)
Pt here for chronic fever, body aches. Also fever blisters on her lips. Pt mainly here for work note. She saw her PCP today.

## 2017-07-30 NOTE — Discharge Instructions (Signed)
Get your blood drawn as directed and follow-up with your primary care provider.

## 2017-07-30 NOTE — Progress Notes (Signed)
Patient is here for fever comes & goes for 6 weeks   Patient stated when it came back this Sunday it came with sore throat body aches  chills runny nose

## 2017-07-30 NOTE — Progress Notes (Signed)
Subjective:  Patient ID: Mary Carasshleigh Statzer, female    DOB: 11/29/1990  Age: 26 y.o. MRN: [id number]  CC: Fever   HPI Mary Garrison presents to complaints of fevers.  Onset 6 weeks ago.  She reports intermittent fevers with highest temperature on Sunday of 103.9 F.  She reports history of ED and urgent care visits related to symptoms.  She reports taken ibuprofen and Tylenol for fevers.  Associated symptoms include swollen/tender neck lymph nodes, body aches, dry intermittent mild cough.  She denies any night sweats, neck stiffness, abdominal complaints, rashes, or sick contacts.  She reports onset of fever blisters to lower lip yesterday in the a.m.  She recent HIV testing.  She denies any recent history of unprotected sex.    Outpatient Medications Prior to Visit  Medication Sig Dispense Refill  . ibuprofen (ADVIL,MOTRIN) 200 MG tablet Take 200 mg by mouth every 6 (six) hours as needed.    Marland Kitchen. albuterol (PROVENTIL HFA;VENTOLIN HFA) 108 (90 Base) MCG/ACT inhaler Inhale 2 puffs into the lungs every 6 (six) hours as needed for wheezing or shortness of breath. For shortness of breath (Patient not taking: Reported on 07/30/2017) 8 g 3  . artificial tears (LACRILUBE) OINT ophthalmic ointment Place into the right eye at bedtime. (Patient not taking: Reported on 07/30/2017) 3.5 g 0  . cetirizine (ZYRTEC) 10 MG tablet Take 1 tablet (10 mg total) by mouth at bedtime. (Patient not taking: Reported on 07/30/2017) 30 tablet 11  . Diclofenac Sodium 3 % GEL Place 1 application onto the skin as needed (for muscle pain). (Patient not taking: Reported on 07/30/2017) 100 g 0  . folic acid (FOLVITE) 1 MG tablet Take 1 tablet (1 mg total) by mouth daily. (Patient not taking: Reported on 07/30/2017) 90 tablet 3  . polyvinyl alcohol (ARTIFICIAL TEARS) 1.4 % ophthalmic solution Place 1 drop into the right eye every hour while awake. (Patient not taking: Reported on 07/30/2017) 15 mL 3  . spironolactone (ALDACTONE) 50 MG  tablet Take 1 tablet (50 mg total) by mouth daily. (Patient not taking: Reported on 07/30/2017) 30 tablet 2  . valACYclovir (VALTREX) 1000 MG tablet Take 1 tablet (1,000 mg total) by mouth 3 (three) times daily. (Patient not taking: Reported on 07/30/2017) 21 tablet 0   No facility-administered medications prior to visit.    Review of Systems  Constitutional: Positive for fever and malaise/fatigue. Negative for diaphoresis.  HENT: Negative.   Eyes: Negative for discharge.  Respiratory: Positive for cough.   Cardiovascular: Negative.   Gastrointestinal: Negative.   Genitourinary: Negative.   Musculoskeletal: Positive for neck pain.  Skin: Negative for rash.       Fever blister lower lip.   Objective:  BP 105/69 (BP Location: Left Arm, Patient Position: Sitting, Cuff Size: Normal)   Pulse 98   Temp 99 F (37.2 C) (Oral)   Resp 18   Ht 5\' 5"  (1.651 m)   Wt 118 lb (53.5 kg)   SpO2 100%   BMI 19.64 kg/m   BP/Weight 07/30/2017 07/30/2017 07/21/2017  Systolic BP 112 105 118  Diastolic BP 69 69 60  Wt. (Lbs) - 118 -  BMI - 19.64 -   Physical Exam  Constitutional: She is well-developed, well-nourished, and in no distress. She appears toxic.  HENT:  Head: Normocephalic and atraumatic.  Right Ear: External ear normal.  Left Ear: External ear normal.  Nose: Nose normal.  Mouth/Throat: Oral lesions present.    Neck: Normal range of motion.  Cardiovascular: Normal rate, regular rhythm, normal heart sounds and intact distal pulses.  Pulmonary/Chest: Effort normal and breath sounds normal.  Abdominal: Soft. Bowel sounds are normal. There is no tenderness.  Lymphadenopathy:    She has cervical adenopathy (tenderness).  Skin: No rash noted.  Psychiatric: Affect normal.  Nursing note and vitals reviewed.  Assessment & Plan:   1. Fever, unspecified fever cause  - HEP, RPR, HIV Panel - CBC - Basic Metabolic Panel - Rheumatoid factor - Sedimentation Rate - C-reactive  protein - Respiratory virus panel - Culture, blood (single) w Reflex to ID Panel - Culture, blood (single) w Reflex to ID Panel - acetaminophen (TYLENOL) 325 MG tablet; Take 2 tablets (650 mg total) by mouth every 4 (four) hours as needed for fever.  Dispense: 30 tablet; Refill: 0 - azithromycin (ZITHROMAX) 250 MG tablet; DAY ONE: TAKE TWO TABLETS BY MOUTH. THEN TAKE ONE TABLET BY MOUTH DAILY  Dispense: 6 tablet; Refill: 0  2. Upper respiratory tract infection, unspecified type  - guaiFENesin-dextromethorphan (ROBITUSSIN DM) 100-10 MG/5ML syrup; Take 5 mLs by mouth every 4 (four) hours as needed for cough.  Dispense: 236 mL; Refill: 0  3. Cold sore  - acyclovir (ZOVIRAX) 400 MG tablet; Take 1 tablet (400 mg total) by mouth 3 (three) times daily.  Dispense: 20 tablet; Refill: 0      Follow-up: Return if symptoms worsen or fail to improve.   Lizbeth BarkMandesia R Jachob Mcclean FNP

## 2017-07-30 NOTE — ED Provider Notes (Signed)
[facility]    CSN: [id number] Arrival date & time: 07/30/17  [id number]     History   Chief Complaint Chief Complaint  Patient presents with  . Fever    HPI Jerry Carasshleigh Skeen is a 26 y.o. female.   26 year old female with a history of fever for 6 weeks. Temperatures range from 100.1 203.9 and occur every day. She been having anterior neck pain and palpable lymph nodes. She saw her PCP today and several labs were ordered. There were unable to draw the labs because she is dehydrated and was advised to drink plenty of water and come back tomorrow for the lab to be drawn. The patient realizes that there is not much that can be done here tonight and her primary reason for visit is to get a work note. She has a history of alcoholism, hepatitis C, hepatic cirrhosis, headache, renal failure, anxiety and multiple other conditions.      Past Medical History:  Diagnosis Date  . Alcohol addiction (HCC)   . Asthma    as a child  . C. difficile diarrhea 08/24/2014  . Cirrhosis, alcoholic (HCC)   . Common migraine with intractable migraine 05/03/2015  . Hemorrhoids   . Hepatitis C 11/2012   she suspects this was from a tattoo as she was dxd with Hep C before starting her IVDA.   Marland Kitchen. Hypokalemia 06/26/2014  . Hypophosphatemia 06/29/2014   Phos of 1.4 > 4.0 with supplementation    . Mallory-Weiss tear   . Polysubstance dependence (HCC)    including IV heroin. inpt rehab 01/2012.   . Tachycardia     Patient Active Problem List   Diagnosis Date Noted  . Pelvic pain 11/24/2016  . Acute seasonal allergic rhinitis 11/24/2016  . Pancytopenia (HCC) 11/16/2015  . Chronic nausea 10/09/2015  . Alcohol use disorder, severe, dependence (HCC) 05/20/2015  . Common migraine with intractable migraine 05/03/2015  . Insomnia 04/17/2015  . Chronic headache 04/17/2015  . Acute blood loss anemia   . Mallory-Weiss tear 04/07/2015  . 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 Headache  03/06/2015  . Malnutrition of moderate degree (HCC) 02/17/2015  . History of acute alcoholic hepatitis 02/17/2015  . Acute renal failure syndrome (HCC)   . Muscle pain 12/27/2014  . Chronic anemia 12/18/2014  . Hypokalemia, inadequate intake 12/18/2014  . GERD (gastroesophageal reflux disease) 10/09/2014  . Alcohol abuse, in remission 08/20/2014  . Tobacco abuse 08/20/2014  . Vitamin D insufficiency 07/26/2014  . Anemia 06/26/2014  . Depression with anxiety 06/21/2014  . Asthma   . History of type C viral hepatitis 11/16/2013    Past Surgical History:  Procedure Laterality Date  . ESOPHAGOGASTRODUODENOSCOPY N/A 04/07/2015   Procedure: ESOPHAGOGASTRODUODENOSCOPY (EGD);  Surgeon: Louis Meckelobert D Kaplan, MD;  Location: [facility];  Service: Gastroenterology;  Laterality: N/A;  . ESOPHAGUS SURGERY    . mallory weiss      OB History    Gravida Para Term Preterm AB Living   1             SAB TAB Ectopic Multiple Live Births                   Home Medications    Prior to Admission medications   Medication Sig Start Date End Date Taking? Authorizing Provider  acetaminophen (TYLENOL) 325 MG tablet Take 2 tablets (650 mg total) by mouth every 4 (four) hours as needed for fever. 07/30/17   Arrie SenateHairston, Mandesia  R, FNP  acyclovir (ZOVIRAX) 400 MG tablet Take 1 tablet (400 mg total) by mouth 3 (three) times daily. 07/30/17   Lizbeth Bark, FNP  azithromycin (ZITHROMAX) 250 MG tablet DAY ONE: TAKE TWO TABLETS BY MOUTH. THEN TAKE ONE TABLET BY MOUTH DAILY 07/30/17   Hairston, Leonia Reeves R, FNP  guaiFENesin-dextromethorphan (ROBITUSSIN DM) 100-10 MG/5ML syrup Take 5 mLs by mouth every 4 (four) hours as needed for cough. 07/30/17   Lizbeth Bark, FNP  ibuprofen (ADVIL,MOTRIN) 200 MG tablet Take 200 mg by mouth every 6 (six) hours as needed.    [provider]    Family History Family History  Problem Relation Age of Onset  . Asthma Mother   . Migraines Mother   . Depression  Brother   . Other Brother   . Migraines Brother   . Depression Sister   . Other Sister   . Migraines Sister   . Heart disease Maternal Grandmother   . Migraines Maternal Grandmother   . Heart disease Father   . Migraines Brother   . Liver cancer Maternal Grandfather   . Pancreatic cancer Maternal Grandfather   . Lung cancer Maternal Grandfather     Social History Social History   Tobacco Use  . Smoking status: Current Every Day Smoker    Packs/day: 0.50    Years: 10.00    Pack years: 5.00    Types: Cigarettes  . Smokeless tobacco: Never Used  Substance Use Topics  . Alcohol use: No    Alcohol/week: 0.0 oz    Comment: Last ETOH Nov 2016  . Drug use: No    Comment: none in 2.5 years   was opiates     Allergies   Penicillins and Sulfa antibiotics   Review of Systems Review of Systems  Constitutional: Positive for activity change, appetite change and fever.  Respiratory: Negative.   Cardiovascular: Negative.   Neurological: Negative.   All other systems reviewed and are negative.    Physical Exam Triage Vital Signs ED Triage Vitals [07/30/17 1928]  Enc Vitals Group     BP 112/69     Pulse Rate 96     Resp 18     Temp 99.5 F (37.5 C)     Temp src      SpO2 100 %     Weight      Height      Head Circumference      Peak Flow      Pain Score      Pain Loc      Pain Edu?      Excl. in GC?    No data found.  Updated Vital Signs BP 112/69   Pulse 96   Temp 99.5 F (37.5 C)   Resp 18   SpO2 100%   Visual Acuity Right Eye Distance:   Left Eye Distance:   Bilateral Distance:    Right Eye Near:   Left Eye Near:    Bilateral Near:     Physical Exam  Constitutional: She is oriented to person, place, and time. She appears well-developed. No distress.  Eyes: EOM are normal.  Neck: Normal range of motion. Neck supple.  Cardiovascular: Normal rate.  Pulmonary/Chest: Effort normal. No respiratory distress.  Musculoskeletal: She exhibits no  edema.  Neurological: She is alert and oriented to person, place, and time. She exhibits normal muscle tone.  Skin: Skin is warm and dry.  Psychiatric: She has a normal mood and affect.  Nursing note and vitals reviewed.    UC Treatments / Results  Labs (all labs ordered are listed, but only abnormal results are displayed) Labs Reviewed - No data to display  EKG  EKG Interpretation None       Radiology No results found.  Procedures Procedures (including critical care time)  Medications Ordered in UC Medications - No data to display   Initial Impression / Assessment and Plan / UC Course  I have reviewed the triage vital signs and the nursing notes.  Pertinent labs & imaging results that were available during my care of the patient were reviewed by me and considered in my medical decision making (see chart for details).    Get your blood drawn as directed and follow-up with your primary care provider.    Final Clinical Impressions(s) / UC Diagnoses   Final diagnoses:  Fever, unspecified fever cause    ED Discharge Orders    None       Controlled Substance Prescriptions [place] Controlled Substance Registry consulted? Not Applicable   Hayden RasmussenMabe, Tyronica Truxillo, NP 07/30/17 2004

## 2017-08-01 LAB — BASIC METABOLIC PANEL
BUN/Creatinine Ratio: 11 (ref 9–23)
BUN: 7 mg/dL (ref 6–20)
CALCIUM: 9 mg/dL (ref 8.7–10.2)
CHLORIDE: 98 mmol/L (ref 96–106)
CO2: 23 mmol/L (ref 20–29)
Creatinine, Ser: 0.64 mg/dL (ref 0.57–1.00)
GFR calc non Af Amer: 122 mL/min/{1.73_m2} (ref >59)
GFR, EST AFRICAN AMERICAN: 140 mL/min/{1.73_m2} (ref >59)
GLUCOSE: 130 mg/dL — AB (ref 65–99)
Potassium: 3.8 mmol/L (ref 3.5–5.2)
Sodium: 134 mmol/L (ref 134–144)

## 2017-08-01 LAB — CBC
Hematocrit: 30 % — ABNORMAL LOW (ref 34.0–46.6)
Hemoglobin: 9.7 g/dL — ABNORMAL LOW (ref 11.1–15.9)
MCH: 24.4 pg — ABNORMAL LOW (ref 26.6–33.0)
MCHC: 32.3 g/dL (ref 31.5–35.7)
MCV: 76 fL — AB (ref 79–97)
PLATELETS: 80 10*3/uL — AB (ref 150–379)
RBC: 3.97 x10E6/uL (ref 3.77–5.28)
RDW: 16.4 % — ABNORMAL HIGH (ref 12.3–15.4)
WBC: 3 10*3/uL — ABNORMAL LOW (ref 3.4–10.8)

## 2017-08-01 LAB — HEP, RPR, HIV PANEL
HIV Screen 4th Generation wRfx: NONREACTIVE
Hepatitis B Surface Ag: NEGATIVE
RPR: NONREACTIVE

## 2017-08-01 LAB — SEDIMENTATION RATE: SED RATE: 56 mm/h — AB (ref 0–32)

## 2017-08-01 LAB — RHEUMATOID FACTOR

## 2017-08-01 LAB — C-REACTIVE PROTEIN: CRP: 34.7 mg/L — AB (ref 0.0–4.9)

## 2017-08-03 ENCOUNTER — Other Ambulatory Visit: Payer: Self-pay | Admitting: Family Medicine

## 2017-08-03 ENCOUNTER — Telehealth: Payer: Self-pay | Admitting: Family Medicine

## 2017-08-03 DIAGNOSIS — R509 Fever, unspecified: Secondary | ICD-10-CM

## 2017-08-03 LAB — RESPIRATORY VIRUS PANEL
Adenovirus: NEGATIVE
INFLUENZA A: NEGATIVE
Influenza B: NEGATIVE
Metapneumovirus: NEGATIVE
PARAINFLUENZA 3 A: NEGATIVE
Parainfluenza 1: NEGATIVE
Parainfluenza 2: NEGATIVE
RESPIRATORY SYNCYTIAL VIRUS A: NEGATIVE
RESPIRATORY SYNCYTIAL VIRUS B: NEGATIVE
Rhinovirus: NEGATIVE

## 2017-08-03 NOTE — Telephone Encounter (Signed)
Left voicemail message for call back.

## 2017-08-04 NOTE — Telephone Encounter (Signed)
Pt return your called, please call, her back

## 2017-08-05 NOTE — Telephone Encounter (Signed)
Called patient and spoke with patient regarding resutls and recommendations for follow up. She communicates understanding and is agreeable to plan.

## 2017-08-06 LAB — CULTURE, BLOOD (SINGLE)

## 2017-08-14 ENCOUNTER — Telehealth: Payer: Self-pay | Admitting: *Deleted

## 2017-08-14 ENCOUNTER — Other Ambulatory Visit: Payer: Self-pay

## 2017-08-14 NOTE — Telephone Encounter (Signed)
Mary Garrison, Mandesia R, FNP  Roger KillAvila, Joanna, CMA        -Blood cultures were negative for bacteria.  -Negative HIV, Hep B, Syphilis  -Blood cells show anemia and low platelet count. -Low platelets can reduce your bloods ability to clot. Avoid any heavy physical activity that will put you at risk of developing a bruise or cut. Do not take aspirin or products that contain aspirin.  Labs and kidney function normal.  -RSV was negative for the most common respiratory virus including influenza.  -RF test which evaluates for rheumatoid arthritis is negative.  Lab tests that evaluated inflammation are elevated.  Recommend walk in lab visit this week for additional labs to rule out other possible causes of symptoms.    Pt name and DOB Verified.  Pt aware of results. Will come in for labs today.

## 2017-09-01 ENCOUNTER — Other Ambulatory Visit: Payer: Self-pay | Admitting: Internal Medicine

## 2017-09-01 ENCOUNTER — Ambulatory Visit
Admission: RE | Admit: 2017-09-01 | Discharge: 2017-09-01 | Disposition: A | Discharge status: Home / Self Care | Payer: No Typology Code available for payment source | Source: Ambulatory Visit | Attending: Internal Medicine | Admitting: Internal Medicine

## 2017-09-01 DIAGNOSIS — K7460 Unspecified cirrhosis of liver: Secondary | ICD-10-CM | POA: Diagnosis not present

## 2017-09-01 DIAGNOSIS — K429 Umbilical hernia without obstruction or gangrene: Secondary | ICD-10-CM | POA: Diagnosis not present

## 2017-09-01 DIAGNOSIS — R7611 Nonspecific reaction to tuberculin skin test without active tuberculosis: Secondary | ICD-10-CM

## 2017-09-17 ENCOUNTER — Encounter: Payer: Self-pay | Admitting: Gastroenterology

## 2017-09-17 ENCOUNTER — Other Ambulatory Visit (INDEPENDENT_AMBULATORY_CARE_PROVIDER_SITE_OTHER): Payer: BLUE CROSS/BLUE SHIELD

## 2017-09-17 ENCOUNTER — Ambulatory Visit: Payer: BLUE CROSS/BLUE SHIELD | Admitting: Gastroenterology

## 2017-09-17 VITALS — BP 88/52 | HR 80 | Ht 65.0 in | Wt 119.2 lb

## 2017-09-17 DIAGNOSIS — K7031 Alcoholic cirrhosis of liver with ascites: Secondary | ICD-10-CM

## 2017-09-17 DIAGNOSIS — Z681 Body mass index (BMI) 19 or less, adult: Secondary | ICD-10-CM | POA: Diagnosis not present

## 2017-09-17 DIAGNOSIS — Z124 Encounter for screening for malignant neoplasm of cervix: Secondary | ICD-10-CM | POA: Diagnosis not present

## 2017-09-17 DIAGNOSIS — D689 Coagulation defect, unspecified: Secondary | ICD-10-CM

## 2017-09-17 DIAGNOSIS — D696 Thrombocytopenia, unspecified: Secondary | ICD-10-CM

## 2017-09-17 DIAGNOSIS — Z01419 Encounter for gynecological examination (general) (routine) without abnormal findings: Secondary | ICD-10-CM | POA: Diagnosis not present

## 2017-09-17 DIAGNOSIS — R87610 Atypical squamous cells of undetermined significance on cytologic smear of cervix (ASC-US): Secondary | ICD-10-CM | POA: Diagnosis not present

## 2017-09-17 LAB — HEPATIC FUNCTION PANEL
ALBUMIN: 4.3 g/dL (ref 3.5–5.2)
ALT: 13 U/L (ref 0–35)
AST: 25 U/L (ref 0–37)
Alkaline Phosphatase: 125 U/L — ABNORMAL HIGH (ref 39–117)
Bilirubin, Direct: 0.2 mg/dL (ref 0.0–0.3)
Total Bilirubin: 0.7 mg/dL (ref 0.2–1.2)
Total Protein: 8.5 g/dL — ABNORMAL HIGH (ref 6.0–8.3)

## 2017-09-17 LAB — PROTIME-INR
INR: 1.3 ratio — ABNORMAL HIGH (ref 0.8–1.0)
PROTHROMBIN TIME: 13.8 s — AB (ref 9.6–13.1)

## 2017-09-17 NOTE — Progress Notes (Signed)
[facility] Progress Note  Chief Complaint: Alcohol related cirrhosis  Subjective  History:  This is a 27 year old woman who follows up for cirrhosis.  She was last seen in March 2018, had compensated disease with low volume ascites.  At that time she was describing chronic fevers that would often occur at night up to 102 degrees OB/GYN in the morning after she took some Tylenol.  Because of this was uncertain, I gave her a short course of Cipro, although the likelihood of SBP seemed quite low.  I see that she had some visits with urgent care last month for this issue as well.  She then appears to have had blood cultures and other testing done with no identifiable infectious cause ,including HIV.  She has generally been feeling well, and recently saw Dr. Harden MoMatt Wakefield for consideration of umbilical hernia repair.  She was told to see me for preop evaluation related to her cirrhosis. 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  She has been abstinent from alcohol since well before her last visit with me.  ROS: Cardiovascular:  no chest pain Respiratory: no dyspnea  The patient's Past Medical, Family and Social History were reviewed and are on file in the EMR.  Objective:  Med list reviewed  Current Outpatient Medications:  .  folic acid (FOLVITE) 1 MG tablet, Take 1 tablet by mouth daily., Disp: , Rfl:    Vital signs in last 24 hrs: Vitals:   09/17/17 1352  BP: (!) 88/52  Pulse: 80    Physical Exam  Petite and thin as before  HEENT: sclera anicteric, oral mucosa moist without lesions  Neck: supple, no thyromegaly, JVD or lymphadenopathy  Cardiac: RRR without murmurs, S1S2 heard, no peripheral edema  Pulm: clear to auscultation bilaterally, normal RR and effort noted  Abdomen: soft, no tenderness, with active bowel sounds.  Spleen tip not palpable, both left and right lobe of liver enlarged as  before.  No bulging flanks or shifting dullness or caput medusae.  There does not seem to be much of an umbilical hernia today.  She says that some days it is protuberant.  Skin; warm and dry, no jaundice or rash  Recent Labs:  CMP Latest Ref Rng & Units 07/31/2017 05/27/2017 11/24/2016  Glucose 65 - 99 mg/dL 742(V130(H) 88 93  BUN 6 - 20 mg/dL 7 11 11   Creatinine 0.57 - 1.00 mg/dL 9.560.64 3.870.69 5.64(P0.53(L)  Sodium 134 - 144 mmol/L 134 140 137  Potassium 3.5 - 5.2 mmol/L 3.8 4.2 4.2  Chloride 96 - 106 mmol/L 98 102 99  CO2 20 - 29 mmol/L 23 25 22   Calcium 8.7 - 10.2 mg/dL 9.0 9.5 9.0  Total Protein 6.0 - 8.5 g/dL - - 7.7  Total Bilirubin 0.0 - 1.2 mg/dL - - 0.8  Alkaline Phos 39 - 117 IU/L - - 162(H)  AST 0 - 40 IU/L - - 32  ALT 0 - 32 IU/L - - 17   CBC Latest Ref Rng & Units 07/31/2017 05/27/2017 11/24/2016  WBC 3.4 - 10.8 x10E3/uL 3.0(L) 4.5 3.4  Hemoglobin 11.1 - 15.9 g/dL 3.2(R9.7(L) 51.811.2 84.111.2  Hematocrit 34.0 - 46.6 % 30.0(L) 35.6 34.7  Platelets 150 - 379 x10E3/uL 80(LL) 103(L) 86(LL)   Last known INR 1.55 in October 2016.  Radiologic studies:  Last CT scan of abdomen 2016, possible upper abdominal varices, no umbilical varices noted ,  last ultrasound April 2018  @ASSESSMENTPLANBEGIN @ Assessment: Encounter Diagnosis  Name Primary?  . Alcoholic cirrhosis of liver with ascites (HCC) Yes  Thrombocytopenia from portal hypertension Umbilical hernia  Mary Garrison has compensated Child's A cirrhosis from prior alcohol abuse.  Mild coagulopathy when last checked in stable thrombocytopenia.  Updated lab work necessary, but based on the last available data, her meld score is under 10, making her low risk for complications of abdominal surgery.  My only concern is whether or not she has portal hypertensive variceal enlargement of any periumbilical vasculature.  While I would normally do an ultrasound for hepatoma screening at this point, I will message Dr. Dwain Sarna and see if he would prefer a CT scan in  order to answer that particular question as well prior to proceeding with hernia repair.  She has an increased risk of bleeding due to her thrombocytopenia and mild coagulopathy, have platelets available ready for transfusion if needed.   Plan: She will otherwise see me in 6 months or call sooner as needed.  At that point.  She will need an upper endoscopy for esophageal and gastric variceal screening.   Total time 30 minutes, over half spent in counseling and coordination of care.   Charlie Pitter III

## 2017-09-17 NOTE — Patient Instructions (Addendum)
Your physician has requested that you go to the basement for the following lab work before leaving today: AFP, Hepatic, PT/INR

## 2017-09-18 ENCOUNTER — Telehealth: Payer: Self-pay | Admitting: Gastroenterology

## 2017-09-18 ENCOUNTER — Other Ambulatory Visit: Payer: Self-pay

## 2017-09-18 DIAGNOSIS — K7030 Alcoholic cirrhosis of liver without ascites: Secondary | ICD-10-CM

## 2017-09-18 DIAGNOSIS — R0989 Other specified symptoms and signs involving the circulatory and respiratory systems: Secondary | ICD-10-CM

## 2017-09-18 LAB — AFP TUMOR MARKER: AFP-Tumor Marker: 2 ng/mL

## 2017-09-18 NOTE — Telephone Encounter (Signed)
Patient returning call about lab results from yesterday 1.31.19

## 2017-09-23 ENCOUNTER — Other Ambulatory Visit (INDEPENDENT_AMBULATORY_CARE_PROVIDER_SITE_OTHER): Payer: BLUE CROSS/BLUE SHIELD

## 2017-09-23 DIAGNOSIS — K7030 Alcoholic cirrhosis of liver without ascites: Secondary | ICD-10-CM

## 2017-09-23 LAB — BASIC METABOLIC PANEL
BUN: 13 mg/dL (ref 6–23)
CALCIUM: 9.3 mg/dL (ref 8.4–10.5)
CHLORIDE: 103 meq/L (ref 96–112)
CO2: 23 meq/L (ref 19–32)
Creatinine, Ser: 0.69 mg/dL (ref 0.40–1.20)
GFR: 107.17 mL/min (ref >60.00)
GLUCOSE: 71 mg/dL (ref 70–99)
Potassium: 4.3 mEq/L (ref 3.5–5.1)
SODIUM: 134 meq/L — AB (ref 135–145)

## 2017-09-24 ENCOUNTER — Other Ambulatory Visit: Payer: Self-pay | Admitting: Gastroenterology

## 2017-09-24 ENCOUNTER — Ambulatory Visit (HOSPITAL_COMMUNITY)
Admission: RE | Admit: 2017-09-24 | Discharge: 2017-09-24 | Disposition: A | Discharge status: Home / Self Care | Payer: BLUE CROSS/BLUE SHIELD | Source: Ambulatory Visit | Attending: Gastroenterology | Admitting: Gastroenterology

## 2017-09-24 ENCOUNTER — Other Ambulatory Visit: Payer: Self-pay

## 2017-09-24 ENCOUNTER — Ambulatory Visit
Admission: RE | Admit: 2017-09-24 | Discharge: 2017-09-24 | Disposition: A | Discharge status: Home / Self Care | Payer: BLUE CROSS/BLUE SHIELD | Source: Ambulatory Visit | Attending: Gastroenterology | Admitting: Gastroenterology

## 2017-09-24 DIAGNOSIS — R161 Splenomegaly, not elsewhere classified: Secondary | ICD-10-CM | POA: Diagnosis not present

## 2017-09-24 DIAGNOSIS — I864 Gastric varices: Secondary | ICD-10-CM | POA: Insufficient documentation

## 2017-09-24 DIAGNOSIS — K717 Toxic liver disease with fibrosis and cirrhosis of liver: Secondary | ICD-10-CM | POA: Insufficient documentation

## 2017-09-24 DIAGNOSIS — R0989 Other specified symptoms and signs involving the circulatory and respiratory systems: Secondary | ICD-10-CM | POA: Diagnosis not present

## 2017-09-24 DIAGNOSIS — I868 Varicose veins of other specified sites: Secondary | ICD-10-CM

## 2017-09-24 DIAGNOSIS — I8500 Esophageal varices without bleeding: Secondary | ICD-10-CM | POA: Insufficient documentation

## 2017-09-24 DIAGNOSIS — K7460 Unspecified cirrhosis of liver: Secondary | ICD-10-CM | POA: Diagnosis not present

## 2017-09-24 DIAGNOSIS — Z452 Encounter for adjustment and management of vascular access device: Secondary | ICD-10-CM | POA: Diagnosis not present

## 2017-09-24 DIAGNOSIS — K7030 Alcoholic cirrhosis of liver without ascites: Secondary | ICD-10-CM

## 2017-09-24 HISTORY — PX: IR VENIPUNCTURE 3YRS/OLDER BY MD: IMG2287

## 2017-09-24 HISTORY — PX: IR US GUIDE VASC ACCESS RIGHT: IMG2390

## 2017-09-24 LAB — HCG, SERUM, QUALITATIVE: Preg, Serum: NEGATIVE

## 2017-09-24 MED ORDER — SODIUM CHLORIDE 0.9 % IJ SOLN
INTRAMUSCULAR | Status: AC
  Filled 2017-09-24: qty 50

## 2017-09-24 MED ORDER — IOPAMIDOL (ISOVUE-300) INJECTION 61%
INTRAVENOUS | Status: AC
  Administered 2017-09-24: 100 mL
  Filled 2017-09-24: qty 100

## 2017-09-24 MED ORDER — IOPAMIDOL (ISOVUE-300) INJECTION 61%: 100.0000 mL | Freq: Once | INTRAVENOUS | Status: DC | PRN

## 2017-09-25 ENCOUNTER — Encounter (HOSPITAL_COMMUNITY): Payer: Self-pay | Admitting: Interventional Radiology

## 2017-10-06 DIAGNOSIS — R7612 Nonspecific reaction to cell mediated immunity measurement of gamma interferon antigen response without active tuberculosis: Secondary | ICD-10-CM | POA: Diagnosis not present

## 2017-10-15 DIAGNOSIS — N72 Inflammatory disease of cervix uteri: Secondary | ICD-10-CM | POA: Diagnosis not present

## 2017-10-15 DIAGNOSIS — Z681 Body mass index (BMI) 19 or less, adult: Secondary | ICD-10-CM | POA: Diagnosis not present

## 2017-10-15 DIAGNOSIS — N870 Mild cervical dysplasia: Secondary | ICD-10-CM | POA: Diagnosis not present

## 2017-10-15 DIAGNOSIS — R87619 Unspecified abnormal cytological findings in specimens from cervix uteri: Secondary | ICD-10-CM | POA: Diagnosis not present

## 2017-10-26 DIAGNOSIS — K429 Umbilical hernia without obstruction or gangrene: Secondary | ICD-10-CM | POA: Diagnosis not present

## 2017-10-26 DIAGNOSIS — K7460 Unspecified cirrhosis of liver: Secondary | ICD-10-CM | POA: Diagnosis not present

## 2017-10-27 ENCOUNTER — Telehealth: Payer: Self-pay | Admitting: Internal Medicine

## 2017-10-27 NOTE — Telephone Encounter (Signed)
Received fax from [facility], fax will on the pcp in-box

## 2017-11-03 ENCOUNTER — Other Ambulatory Visit: Payer: Self-pay | Admitting: General Surgery

## 2017-11-03 DIAGNOSIS — L600 Ingrowing nail: Secondary | ICD-10-CM | POA: Diagnosis not present

## 2017-11-06 DIAGNOSIS — R7612 Nonspecific reaction to cell mediated immunity measurement of gamma interferon antigen response without active tuberculosis: Secondary | ICD-10-CM | POA: Diagnosis not present

## 2017-11-10 DIAGNOSIS — L600 Ingrowing nail: Secondary | ICD-10-CM | POA: Diagnosis not present

## 2017-11-20 NOTE — Pre-Procedure Instructions (Addendum)
Jerry Carasshleigh Lyster  11/20/2017      [facility] [address]- [place], [place] - [id number] [place] [id number] Phone: [phone] Fax: [phone]    Your procedure is scheduled on Tuesday April 16.  [facility]:30 [facility].M.  Call this number if you have problems the morning of surgery:  [phone]   Remember:  Do not eat food or drink liquids after midnight.  **DRINK Ensure Pre-surgery drink prior to leaving home the morning of surgery**   Take these medicines the morning of surgery with A SIP OF WATER: NONE  7 days prior to surgery STOP taking any Aspirin(unless otherwise instructed by your surgeon), Aleve, Naproxen, Ibuprofen, Motrin, Advil, Goody's, BC's, all herbal medications, fish oil, and all vitamins    Do not wear jewelry, make-up or nail polish.  Do not wear lotions, powders, or perfumes, or deodorant.  Do not shave 48 hours prior to surgery.  Men may shave face and neck.  Do not bring valuables to the hospital.  [facility] is not responsible for any belongings or valuables.  Contacts, dentures or bridgework may not be worn into surgery.  Leave your suitcase in the car.  After surgery it may be brought to your room.  For patients admitted to the hospital, discharge time will be determined by your treatment team.  Patients discharged the day of surgery will not be allowed to drive home.   Special instructions:    [facility]- Preparing For Surgery  Before surgery, you can play an important role. Because skin is not sterile, your skin needs to be as free of germs as possible. You can reduce the number of germs on your skin by washing with CHG (chlorahexidine gluconate) Soap before surgery.  CHG is an antiseptic cleaner which kills germs and bonds with the skin to continue killing germs even after washing.  Please do not use if you have an allergy to CHG or antibacterial soaps. If your skin  becomes reddened/irritated stop using the CHG.  Do not shave (including legs and underarms) for at least 48 hours prior to first CHG shower. It is OK to shave your face.  Please follow these instructions carefully.   1. Shower the NIGHT BEFORE SURGERY and the MORNING OF SURGERY with CHG.   2. If you chose to wash your hair, wash your hair first as usual with your normal shampoo.  3. After you shampoo, rinse your hair and body thoroughly to remove the shampoo.  4. Use CHG as you would any other liquid soap. You can apply CHG directly to the skin and wash gently with a scrungie or a clean washcloth.   5. Apply the CHG Soap to your body ONLY FROM THE NECK DOWN.  Do not use on open wounds or open sores. Avoid contact with your eyes, ears, mouth and genitals (private parts). Wash Face and genitals (private parts)  with your normal soap.  6. Wash thoroughly, paying special attention to the area where your surgery will be performed.  7. Thoroughly rinse your body with warm water from the neck down.  8. DO NOT shower/wash with your normal soap after using and rinsing off the CHG Soap.  9. Pat yourself dry with a CLEAN TOWEL.  10. Wear CLEAN PAJAMAS to bed the night before surgery, wear comfortable clothes the morning of surgery  11. Place CLEAN SHEETS on your bed the night of your first shower  and DO NOT SLEEP WITH PETS.    Day of Surgery: Do not apply any deodorants/lotions. Please wear clean clothes to the hospital/surgery center.      Please read over the following fact sheets that you were given. Coughing and Deep Breathing and Surgical Site Infection Prevention

## 2017-11-23 ENCOUNTER — Inpatient Hospital Stay (HOSPITAL_COMMUNITY)
Admission: RE | Admit: 2017-11-23 | Discharge: 2017-11-23 | Disposition: A | Discharge status: Home / Self Care | Payer: Self-pay | Source: Ambulatory Visit

## 2017-12-01 ENCOUNTER — Encounter (HOSPITAL_COMMUNITY): Admission: RE | Payer: Self-pay | Source: Ambulatory Visit

## 2017-12-01 ENCOUNTER — Ambulatory Visit (HOSPITAL_COMMUNITY)
Admission: RE | Admit: 2017-12-01 | Payer: BLUE CROSS/BLUE SHIELD | Source: Ambulatory Visit | Admitting: General Surgery

## 2017-12-01 SURGERY — REPAIR, HERNIA, UMBILICAL, ADULT
Anesthesia: General

## 2017-12-08 DIAGNOSIS — R7612 Nonspecific reaction to cell mediated immunity measurement of gamma interferon antigen response without active tuberculosis: Secondary | ICD-10-CM | POA: Diagnosis not present

## 2017-12-08 DIAGNOSIS — N911 Secondary amenorrhea: Secondary | ICD-10-CM | POA: Diagnosis not present

## 2018-02-08 ENCOUNTER — Ambulatory Visit
Admission: RE | Admit: 2018-02-08 | Discharge: 2018-02-08 | Disposition: A | Discharge status: Home / Self Care | Payer: BLUE CROSS/BLUE SHIELD | Source: Ambulatory Visit | Attending: Nurse Practitioner | Admitting: Nurse Practitioner

## 2018-02-08 ENCOUNTER — Other Ambulatory Visit: Payer: Self-pay | Admitting: Nurse Practitioner

## 2018-02-08 DIAGNOSIS — K7030 Alcoholic cirrhosis of liver without ascites: Secondary | ICD-10-CM | POA: Diagnosis not present

## 2018-02-08 DIAGNOSIS — Z720 Tobacco use: Secondary | ICD-10-CM | POA: Diagnosis not present

## 2018-02-08 DIAGNOSIS — R509 Fever, unspecified: Secondary | ICD-10-CM

## 2018-02-08 DIAGNOSIS — F1011 Alcohol abuse, in remission: Secondary | ICD-10-CM | POA: Diagnosis not present

## 2018-02-17 DIAGNOSIS — F1911 Other psychoactive substance abuse, in remission: Secondary | ICD-10-CM | POA: Diagnosis not present

## 2018-02-17 DIAGNOSIS — R768 Other specified abnormal immunological findings in serum: Secondary | ICD-10-CM | POA: Diagnosis not present

## 2018-02-17 DIAGNOSIS — R509 Fever, unspecified: Secondary | ICD-10-CM | POA: Diagnosis not present

## 2018-04-01 ENCOUNTER — Encounter: Payer: Self-pay | Admitting: Internal Medicine

## 2018-04-01 ENCOUNTER — Ambulatory Visit: Payer: BLUE CROSS/BLUE SHIELD | Admitting: Internal Medicine

## 2018-04-01 DIAGNOSIS — R7611 Nonspecific reaction to tuberculin skin test without active tuberculosis: Secondary | ICD-10-CM | POA: Diagnosis not present

## 2018-04-01 DIAGNOSIS — I8510 Secondary esophageal varices without bleeding: Secondary | ICD-10-CM

## 2018-04-01 DIAGNOSIS — Z227 Latent tuberculosis: Secondary | ICD-10-CM

## 2018-04-01 DIAGNOSIS — I8500 Esophageal varices without bleeding: Secondary | ICD-10-CM | POA: Insufficient documentation

## 2018-04-01 DIAGNOSIS — R509 Fever, unspecified: Secondary | ICD-10-CM | POA: Diagnosis not present

## 2018-04-01 NOTE — Progress Notes (Signed)
[facility]  Reason for Consult: Intermittent fever of unknown origin Referring Provider: Dr. Karmen Bongo  Assessment: The cause of her intermittent fevers is unclear.  She does not describe other signs or symptoms that are common with periodic fever syndromes (familial Mediterranean fever, TRAPS, etc.).  I do not find any clues on her exam today as to a potential cause.  I have asked her to keep a journal if she develops another episode.  I will repeat some blood work today and see her back in 4 weeks.  Plan: 1. Check CBC, CMP, ESR, CRP, hepatitis C viral load, LDH, CK, ANA, UA and serum protein electrophoresis 2. Follow-up in 4 weeks  Patient Active Problem List   Diagnosis Date Noted  . Intermittent fever of unknown origin 04/01/2018    Priority: High  . 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 Esophageal varices ([place]) 04/01/2018  . Acute seasonal allergic rhinitis 11/24/2016  . Pancytopenia ([place]) 11/16/2015  . 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 Alcoholic cirrhosis of liver without ascites ([place])   . History of hepatorenal syndrome   . GERD (gastroesophageal reflux disease) 10/09/2014  . Alcohol abuse, in remission 08/20/2014  . Tobacco abuse 08/20/2014  . Vitamin D insufficiency 07/26/2014  . Depression with anxiety 06/21/2014  . Asthma   . History of type C viral hepatitis 11/16/2013    Patient's Medications  New Prescriptions   No medications on file  Previous Medications   FOLIC ACID (FOLVITE) 1 MG TABLET    Take 1 tablet by mouth daily.  Modified Medications   No medications on file  Discontinued Medications   No medications on file    HPI: Mary Garrison is a 27 y.o. female with a history of recurrent fevers dating back to at least March 2018.  Her gastroenterologist, Dr. Loletha Carrow, mentioned that she reported fevers from his note in March 2018.  She believes that that was her first episode.  She said that the fevers  lasted about 1 week before resolving spontaneously.  Her second episode began in October.  She was seen in the emergency department where her documented temperature was 99.8.  She was seen back in the emergency department twice in December 2018.  She reported a temperature of over 102 degrees.  Her documented temperature was 100.7 degrees on her first visit in December.  Each of these episodes resolved spontaneously again.  She then had an episode in late May and early June of this year.  During each of these episodes that she felt well other than fever and denies any other unique symptoms.  She did not have any headache, acute respiratory symptoms, mouth sores, changes in vision, unusual pain, nausea, vomiting, diarrhea, dysuria, rash, lymphadenopathy that she is aware of, joint pain or swelling.  She is never received any antibiotic therapy for these episodes.  She works as a Quarry manager at a [employer].  Her TB skin test was positive in January of this year.  She reports a negative chest x-ray and was treated with INH and rifapentine for 12 weeks from February to March of this year.  She has her health department card clearing.she completed therapy.  She has never traveled outside of the [place].  She has 1 healthy cat.  She has no other unusual animal exposures that she knows of.  She has a healthy 33-year-old son.  She is not married.  She has a history of  alcoholism and alcoholic cirrhosis but has been sober for over 2 years.  An abdominal and pelvic CT scan done in February showed cirrhosis, splenomegaly and gastroesophageal varices.  There were no focal liver lesions or other acute abnormalities.  She had no ascites.  Her alpha-fetoprotein level was normal.  She has a history of hepatitis C.  Her hepatitis C viral load was 80,528 in January 2016 but spontaneously reverted to undetectable by August 2016.  She never required treatment.  Blood work obtained at last December showed an elevated  sedimentation rate of 56 and an elevated CRP of 34.7.  She was noted to have chronic pancytopenia.  HIV antibody, hepatitis B surface antigen, RPR, rheumatoid factor and blood cultures were all negative.  She states that her mother is alive and relatively healthy.  She does not know anything about her father.  She is uncertain of her parent's and grandparent's heritage.    Review of Systems: Review of Systems  Constitutional: Positive for fever. Negative for chills, diaphoresis, malaise/fatigue and weight loss.  HENT: Negative for congestion and sore throat.   Respiratory: Negative for cough, sputum production and shortness of breath.   Cardiovascular: Negative for chest pain.  Gastrointestinal: Negative for abdominal pain, diarrhea, nausea and vomiting.  Genitourinary: Negative for dysuria.  Musculoskeletal: Negative for back pain and joint pain.  Skin: Negative for rash.  Neurological: Negative for headaches.  Psychiatric/Behavioral: Negative for depression and substance abuse.      Past Medical History:  Diagnosis Date  . Alcohol addiction ([place])   . Asthma    as a child  . C. difficile diarrhea 08/24/2014  . Cirrhosis, alcoholic ([place])   . Common migraine with intractable migraine 05/03/2015  . Hemorrhoids   . Hepatitis C 11/2012   she suspects this was from a tattoo as she was dxd with Hep C before starting her IVDA.   Marland Kitchen Hypokalemia 06/26/2014  . Hypophosphatemia 06/29/2014   Phos of 1.4 > 4.0 with supplementation    . Mallory-Weiss tear   . Polysubstance dependence ([place])    including IV heroin. inpt rehab 01/2012.   . Tachycardia     Social History   Tobacco Use  . Smoking status: Current Every Day Smoker    Packs/day: 0.50    Years: 10.00    Pack years: 5.00    Types: Cigarettes  . Smokeless tobacco: Never Used  Substance Use Topics  . Alcohol use: No    Alcohol/week: 0.0 standard drinks    Comment: Last ETOH Nov 2016  . Drug use: No    Comment: none in 2.5  years   was opiates    Family History  Problem Relation Age of Onset  . Asthma Mother   . Migraines Mother   . Depression Brother   . Other Brother   . Migraines Brother   . Depression Sister   . Other Sister   . Migraines Sister   . Heart disease Maternal Grandmother   . Migraines Maternal Grandmother   . Heart disease Father   . Migraines Brother   . Liver cancer Maternal Grandfather   . Pancreatic cancer Maternal Grandfather   . Lung cancer Maternal Grandfather    Allergies  Allergen Reactions  . Penicillins     unknown  . Sulfa Antibiotics     unknown    OBJECTIVE: Vitals:   04/01/18 1400  BP: 109/74  Pulse: 89  Temp: 98.5 F (36.9 C)  Weight: 114 lb 12.8 oz (52.1  kg)   Body mass index is 19.1 kg/m.   Physical Exam  Constitutional: She is oriented to person, place, and time.  She is a very pleasant young woman.  She is in no distress.  HENT:  Mouth/Throat: No oropharyngeal exudate.  She has no abnormal oropharyngeal lesions  Eyes: Conjunctivae are normal.  Neck: Neck supple.  Cardiovascular: Normal rate, regular rhythm and normal heart sounds.  No murmur heard. Pulmonary/Chest: Effort normal and breath sounds normal. She has no wheezes. She has no rales.  Abdominal: Soft. There is no tenderness.  Firm liver and spleen margins are palpable.  She has no obvious ascites.  Musculoskeletal: Normal range of motion. She exhibits no edema or tenderness.  Lymphadenopathy:       Head (right side): No submandibular adenopathy present.       Head (left side): No submandibular adenopathy present.    She has no cervical adenopathy.    She has no axillary adenopathy.       Right: No epitrochlear adenopathy present.       Left: No epitrochlear adenopathy present.  Neurological: She is alert and oriented to person, place, and time.  Skin: No rash noted.  Psychiatric: She has a normal mood and affect.    Microbiology: No results found for this or any previous  visit (from the past 240 hour(s)).  Michel Bickers, MD [facility] [phone] pager   [phone] cell 04/01/2018, 5:10 PM

## 2018-04-02 ENCOUNTER — Other Ambulatory Visit: Payer: Self-pay | Admitting: Internal Medicine

## 2018-04-02 DIAGNOSIS — R509 Fever, unspecified: Secondary | ICD-10-CM

## 2018-04-08 ENCOUNTER — Other Ambulatory Visit: Payer: Self-pay

## 2018-05-18 ENCOUNTER — Ambulatory Visit: Payer: Self-pay | Admitting: Internal Medicine

## 2018-09-20 DIAGNOSIS — Z131 Encounter for screening for diabetes mellitus: Secondary | ICD-10-CM | POA: Diagnosis not present

## 2018-09-20 DIAGNOSIS — Z720 Tobacco use: Secondary | ICD-10-CM | POA: Diagnosis not present

## 2018-09-20 DIAGNOSIS — Z136 Encounter for screening for cardiovascular disorders: Secondary | ICD-10-CM | POA: Diagnosis not present

## 2018-09-20 DIAGNOSIS — Z0001 Encounter for general adult medical examination with abnormal findings: Secondary | ICD-10-CM | POA: Diagnosis not present

## 2018-09-20 DIAGNOSIS — J4520 Mild intermittent asthma, uncomplicated: Secondary | ICD-10-CM | POA: Diagnosis not present

## 2018-09-20 DIAGNOSIS — Z5181 Encounter for therapeutic drug level monitoring: Secondary | ICD-10-CM | POA: Diagnosis not present

## 2018-09-20 DIAGNOSIS — Z01118 Encounter for examination of ears and hearing with other abnormal findings: Secondary | ICD-10-CM | POA: Diagnosis not present

## 2018-12-31 DIAGNOSIS — Z719 Counseling, unspecified: Secondary | ICD-10-CM | POA: Diagnosis not present

## 2019-02-14 DIAGNOSIS — A084 Viral intestinal infection, unspecified: Secondary | ICD-10-CM | POA: Diagnosis not present

## 2019-03-29 DIAGNOSIS — M546 Pain in thoracic spine: Secondary | ICD-10-CM | POA: Diagnosis not present

## 2019-03-29 DIAGNOSIS — M62830 Muscle spasm of back: Secondary | ICD-10-CM | POA: Diagnosis not present

## 2019-03-29 DIAGNOSIS — M542 Cervicalgia: Secondary | ICD-10-CM | POA: Diagnosis not present

## 2019-03-29 DIAGNOSIS — M545 Low back pain: Secondary | ICD-10-CM | POA: Diagnosis not present

## 2019-04-12 DIAGNOSIS — M542 Cervicalgia: Secondary | ICD-10-CM | POA: Diagnosis not present

## 2019-04-12 DIAGNOSIS — M545 Low back pain: Secondary | ICD-10-CM | POA: Diagnosis not present

## 2019-04-12 DIAGNOSIS — M546 Pain in thoracic spine: Secondary | ICD-10-CM | POA: Diagnosis not present

## 2019-07-01 DIAGNOSIS — Z113 Encounter for screening for infections with a predominantly sexual mode of transmission: Secondary | ICD-10-CM | POA: Diagnosis not present

## 2019-07-01 DIAGNOSIS — R319 Hematuria, unspecified: Secondary | ICD-10-CM | POA: Diagnosis not present

## 2019-07-01 DIAGNOSIS — R82998 Other abnormal findings in urine: Secondary | ICD-10-CM | POA: Diagnosis not present

## 2019-07-01 DIAGNOSIS — Z01419 Encounter for gynecological examination (general) (routine) without abnormal findings: Secondary | ICD-10-CM | POA: Diagnosis not present

## 2019-07-10 DIAGNOSIS — Z30430 Encounter for insertion of intrauterine contraceptive device: Secondary | ICD-10-CM | POA: Diagnosis not present

## 2019-09-21 DIAGNOSIS — Z206 Contact with and (suspected) exposure to human immunodeficiency virus [HIV]: Secondary | ICD-10-CM | POA: Diagnosis not present

## 2019-09-21 DIAGNOSIS — Z202 Contact with and (suspected) exposure to infections with a predominantly sexual mode of transmission: Secondary | ICD-10-CM | POA: Diagnosis not present

## 2019-09-26 DIAGNOSIS — Z30430 Encounter for insertion of intrauterine contraceptive device: Secondary | ICD-10-CM | POA: Diagnosis not present

## 2019-09-26 DIAGNOSIS — R102 Pelvic and perineal pain: Secondary | ICD-10-CM | POA: Diagnosis not present

## 2019-10-05 DIAGNOSIS — F411 Generalized anxiety disorder: Secondary | ICD-10-CM | POA: Diagnosis not present

## 2019-10-05 DIAGNOSIS — F429 Obsessive-compulsive disorder, unspecified: Secondary | ICD-10-CM | POA: Diagnosis not present

## 2019-10-12 DIAGNOSIS — F429 Obsessive-compulsive disorder, unspecified: Secondary | ICD-10-CM | POA: Diagnosis not present

## 2019-10-12 DIAGNOSIS — F411 Generalized anxiety disorder: Secondary | ICD-10-CM | POA: Diagnosis not present

## 2019-10-13 NOTE — Telephone Encounter (Signed)
error 

## 2019-10-18 DIAGNOSIS — F429 Obsessive-compulsive disorder, unspecified: Secondary | ICD-10-CM | POA: Diagnosis not present

## 2019-10-18 DIAGNOSIS — F411 Generalized anxiety disorder: Secondary | ICD-10-CM | POA: Diagnosis not present

## 2019-10-26 DIAGNOSIS — F429 Obsessive-compulsive disorder, unspecified: Secondary | ICD-10-CM | POA: Diagnosis not present

## 2019-10-26 DIAGNOSIS — F411 Generalized anxiety disorder: Secondary | ICD-10-CM | POA: Diagnosis not present

## 2019-11-23 DIAGNOSIS — F429 Obsessive-compulsive disorder, unspecified: Secondary | ICD-10-CM | POA: Diagnosis not present

## 2019-11-23 DIAGNOSIS — F411 Generalized anxiety disorder: Secondary | ICD-10-CM | POA: Diagnosis not present

## 2019-12-09 DIAGNOSIS — F429 Obsessive-compulsive disorder, unspecified: Secondary | ICD-10-CM | POA: Diagnosis not present

## 2019-12-09 DIAGNOSIS — F411 Generalized anxiety disorder: Secondary | ICD-10-CM | POA: Diagnosis not present

## 2019-12-22 DIAGNOSIS — F411 Generalized anxiety disorder: Secondary | ICD-10-CM | POA: Diagnosis not present

## 2019-12-22 DIAGNOSIS — F429 Obsessive-compulsive disorder, unspecified: Secondary | ICD-10-CM | POA: Diagnosis not present

## 2020-01-05 DIAGNOSIS — F411 Generalized anxiety disorder: Secondary | ICD-10-CM | POA: Diagnosis not present

## 2020-01-05 DIAGNOSIS — F429 Obsessive-compulsive disorder, unspecified: Secondary | ICD-10-CM | POA: Diagnosis not present

## 2020-01-30 DIAGNOSIS — F429 Obsessive-compulsive disorder, unspecified: Secondary | ICD-10-CM | POA: Diagnosis not present

## 2020-01-30 DIAGNOSIS — F411 Generalized anxiety disorder: Secondary | ICD-10-CM | POA: Diagnosis not present
